# Patient Record
Sex: Male | Born: 1965
Health system: Southern US, Community
[De-identification: ages and names within clinical notes are randomized; demographics above are authoritative.]

## PROBLEM LIST (undated history)

## (undated) DIAGNOSIS — I484 Atypical atrial flutter: Secondary | ICD-10-CM

## (undated) HISTORY — PX: APPENDECTOMY: SHX54

## (undated) HISTORY — DX: Atypical atrial flutter: I48.4

---

## 2006-06-15 ENCOUNTER — Ambulatory Visit: Payer: Self-pay | Admitting: Psychiatry

## 2006-06-15 ENCOUNTER — Inpatient Hospital Stay (HOSPITAL_COMMUNITY): Admission: EM | Admit: 2006-06-15 | Discharge: 2006-06-22 | Payer: Self-pay | Admitting: Psychiatry

## 2013-11-26 DIAGNOSIS — N179 Acute kidney failure, unspecified: Secondary | ICD-10-CM | POA: Diagnosis not present

## 2013-11-26 DIAGNOSIS — A045 Campylobacter enteritis: Secondary | ICD-10-CM | POA: Diagnosis not present

## 2013-11-26 DIAGNOSIS — Z23 Encounter for immunization: Secondary | ICD-10-CM | POA: Diagnosis not present

## 2013-11-26 DIAGNOSIS — D3500 Benign neoplasm of unspecified adrenal gland: Secondary | ICD-10-CM | POA: Diagnosis not present

## 2013-11-26 DIAGNOSIS — Z79899 Other long term (current) drug therapy: Secondary | ICD-10-CM | POA: Diagnosis not present

## 2013-11-26 DIAGNOSIS — F200 Paranoid schizophrenia: Secondary | ICD-10-CM | POA: Diagnosis not present

## 2013-11-26 DIAGNOSIS — E860 Dehydration: Secondary | ICD-10-CM | POA: Diagnosis present

## 2013-11-26 DIAGNOSIS — R112 Nausea with vomiting, unspecified: Secondary | ICD-10-CM | POA: Diagnosis not present

## 2013-12-20 DIAGNOSIS — N179 Acute kidney failure, unspecified: Secondary | ICD-10-CM | POA: Diagnosis not present

## 2013-12-20 DIAGNOSIS — Z23 Encounter for immunization: Secondary | ICD-10-CM | POA: Diagnosis not present

## 2013-12-20 DIAGNOSIS — D509 Iron deficiency anemia, unspecified: Secondary | ICD-10-CM | POA: Diagnosis not present

## 2013-12-20 DIAGNOSIS — E119 Type 2 diabetes mellitus without complications: Secondary | ICD-10-CM | POA: Diagnosis not present

## 2013-12-20 DIAGNOSIS — E039 Hypothyroidism, unspecified: Secondary | ICD-10-CM | POA: Diagnosis not present

## 2013-12-20 DIAGNOSIS — F209 Schizophrenia, unspecified: Secondary | ICD-10-CM | POA: Diagnosis not present

## 2013-12-20 DIAGNOSIS — A045 Campylobacter enteritis: Secondary | ICD-10-CM | POA: Diagnosis not present

## 2014-01-27 DIAGNOSIS — K521 Toxic gastroenteritis and colitis: Secondary | ICD-10-CM | POA: Diagnosis not present

## 2014-01-29 DIAGNOSIS — N183 Chronic kidney disease, stage 3 unspecified: Secondary | ICD-10-CM | POA: Diagnosis not present

## 2014-01-29 DIAGNOSIS — D539 Nutritional anemia, unspecified: Secondary | ICD-10-CM | POA: Diagnosis not present

## 2014-01-29 DIAGNOSIS — K5289 Other specified noninfective gastroenteritis and colitis: Secondary | ICD-10-CM | POA: Diagnosis not present

## 2014-01-29 DIAGNOSIS — D638 Anemia in other chronic diseases classified elsewhere: Secondary | ICD-10-CM | POA: Diagnosis not present

## 2014-03-23 DIAGNOSIS — F209 Schizophrenia, unspecified: Secondary | ICD-10-CM | POA: Diagnosis not present

## 2014-04-02 DIAGNOSIS — F209 Schizophrenia, unspecified: Secondary | ICD-10-CM | POA: Diagnosis not present

## 2014-09-03 DIAGNOSIS — F209 Schizophrenia, unspecified: Secondary | ICD-10-CM | POA: Diagnosis not present

## 2015-02-04 DIAGNOSIS — F209 Schizophrenia, unspecified: Secondary | ICD-10-CM | POA: Diagnosis not present

## 2015-07-08 DIAGNOSIS — F209 Schizophrenia, unspecified: Secondary | ICD-10-CM | POA: Diagnosis not present

## 2015-09-11 DIAGNOSIS — H6123 Impacted cerumen, bilateral: Secondary | ICD-10-CM | POA: Diagnosis not present

## 2015-09-11 DIAGNOSIS — Z79899 Other long term (current) drug therapy: Secondary | ICD-10-CM | POA: Diagnosis not present

## 2015-09-11 DIAGNOSIS — D638 Anemia in other chronic diseases classified elsewhere: Secondary | ICD-10-CM | POA: Diagnosis not present

## 2015-09-11 DIAGNOSIS — N183 Chronic kidney disease, stage 3 (moderate): Secondary | ICD-10-CM | POA: Diagnosis not present

## 2015-09-11 DIAGNOSIS — Z0000 Encounter for general adult medical examination without abnormal findings: Secondary | ICD-10-CM | POA: Diagnosis not present

## 2015-09-11 DIAGNOSIS — Z23 Encounter for immunization: Secondary | ICD-10-CM | POA: Diagnosis not present

## 2015-10-07 DIAGNOSIS — F209 Schizophrenia, unspecified: Secondary | ICD-10-CM | POA: Diagnosis not present

## 2016-07-06 DIAGNOSIS — F209 Schizophrenia, unspecified: Secondary | ICD-10-CM | POA: Diagnosis not present

## 2016-10-19 DIAGNOSIS — F209 Schizophrenia, unspecified: Secondary | ICD-10-CM | POA: Diagnosis not present

## 2016-12-25 DIAGNOSIS — F209 Schizophrenia, unspecified: Secondary | ICD-10-CM | POA: Diagnosis not present

## 2017-02-19 DIAGNOSIS — F209 Schizophrenia, unspecified: Secondary | ICD-10-CM | POA: Diagnosis not present

## 2017-05-18 DIAGNOSIS — F209 Schizophrenia, unspecified: Secondary | ICD-10-CM | POA: Diagnosis not present

## 2019-09-21 DIAGNOSIS — N184 Chronic kidney disease, stage 4 (severe): Secondary | ICD-10-CM | POA: Diagnosis not present

## 2019-09-21 DIAGNOSIS — I5020 Unspecified systolic (congestive) heart failure: Secondary | ICD-10-CM | POA: Diagnosis not present

## 2019-09-21 DIAGNOSIS — I340 Nonrheumatic mitral (valve) insufficiency: Secondary | ICD-10-CM | POA: Diagnosis not present

## 2019-09-21 DIAGNOSIS — I361 Nonrheumatic tricuspid (valve) insufficiency: Secondary | ICD-10-CM

## 2019-09-21 DIAGNOSIS — I4892 Unspecified atrial flutter: Secondary | ICD-10-CM

## 2019-09-22 ENCOUNTER — Encounter (HOSPITAL_COMMUNITY): Payer: Self-pay | Admitting: Internal Medicine

## 2019-09-22 ENCOUNTER — Inpatient Hospital Stay (HOSPITAL_COMMUNITY)
Admission: AD | Admit: 2019-09-22 | Discharge: 2019-09-28 | DRG: 308 | Disposition: A | Discharge status: Home / Self Care | Payer: Medicare Other | Source: Other Acute Inpatient Hospital | Attending: Internal Medicine | Admitting: Internal Medicine

## 2019-09-22 DIAGNOSIS — I420 Dilated cardiomyopathy: Secondary | ICD-10-CM | POA: Diagnosis not present

## 2019-09-22 DIAGNOSIS — I5021 Acute systolic (congestive) heart failure: Secondary | ICD-10-CM | POA: Diagnosis not present

## 2019-09-22 DIAGNOSIS — J90 Pleural effusion, not elsewhere classified: Secondary | ICD-10-CM

## 2019-09-22 DIAGNOSIS — I10 Essential (primary) hypertension: Secondary | ICD-10-CM

## 2019-09-22 DIAGNOSIS — I5043 Acute on chronic combined systolic (congestive) and diastolic (congestive) heart failure: Secondary | ICD-10-CM | POA: Diagnosis present

## 2019-09-22 DIAGNOSIS — I5020 Unspecified systolic (congestive) heart failure: Secondary | ICD-10-CM | POA: Diagnosis not present

## 2019-09-22 DIAGNOSIS — F209 Schizophrenia, unspecified: Secondary | ICD-10-CM

## 2019-09-22 DIAGNOSIS — I484 Atypical atrial flutter: Principal | ICD-10-CM

## 2019-09-22 DIAGNOSIS — N179 Acute kidney failure, unspecified: Secondary | ICD-10-CM | POA: Diagnosis present

## 2019-09-22 DIAGNOSIS — Z79899 Other long term (current) drug therapy: Secondary | ICD-10-CM

## 2019-09-22 DIAGNOSIS — I517 Cardiomegaly: Secondary | ICD-10-CM

## 2019-09-22 DIAGNOSIS — Z20828 Contact with and (suspected) exposure to other viral communicable diseases: Secondary | ICD-10-CM | POA: Diagnosis present

## 2019-09-22 DIAGNOSIS — I4892 Unspecified atrial flutter: Secondary | ICD-10-CM | POA: Diagnosis not present

## 2019-09-22 DIAGNOSIS — I4891 Unspecified atrial fibrillation: Secondary | ICD-10-CM | POA: Diagnosis present

## 2019-09-22 DIAGNOSIS — N390 Urinary tract infection, site not specified: Secondary | ICD-10-CM | POA: Diagnosis present

## 2019-09-22 DIAGNOSIS — N183 Chronic kidney disease, stage 3 unspecified: Secondary | ICD-10-CM | POA: Diagnosis present

## 2019-09-22 DIAGNOSIS — I429 Cardiomyopathy, unspecified: Secondary | ICD-10-CM | POA: Diagnosis not present

## 2019-09-22 DIAGNOSIS — E876 Hypokalemia: Secondary | ICD-10-CM | POA: Diagnosis present

## 2019-09-22 DIAGNOSIS — I5041 Acute combined systolic (congestive) and diastolic (congestive) heart failure: Secondary | ICD-10-CM | POA: Diagnosis not present

## 2019-09-22 DIAGNOSIS — I130 Hypertensive heart and chronic kidney disease with heart failure and stage 1 through stage 4 chronic kidney disease, or unspecified chronic kidney disease: Secondary | ICD-10-CM | POA: Diagnosis present

## 2019-09-22 DIAGNOSIS — N184 Chronic kidney disease, stage 4 (severe): Secondary | ICD-10-CM | POA: Diagnosis present

## 2019-09-22 DIAGNOSIS — N189 Chronic kidney disease, unspecified: Secondary | ICD-10-CM | POA: Diagnosis not present

## 2019-09-22 DIAGNOSIS — I428 Other cardiomyopathies: Secondary | ICD-10-CM | POA: Diagnosis not present

## 2019-09-22 DIAGNOSIS — J181 Lobar pneumonia, unspecified organism: Secondary | ICD-10-CM

## 2019-09-22 DIAGNOSIS — IMO0002 Reserved for concepts with insufficient information to code with codable children: Secondary | ICD-10-CM

## 2019-09-22 DIAGNOSIS — I340 Nonrheumatic mitral (valve) insufficiency: Secondary | ICD-10-CM | POA: Diagnosis not present

## 2019-09-22 HISTORY — DX: Essential (primary) hypertension: I10

## 2019-09-22 HISTORY — DX: Unspecified atrial fibrillation: I48.91

## 2019-09-22 HISTORY — DX: Cardiomegaly: I51.7

## 2019-09-22 HISTORY — DX: Schizophrenia, unspecified: F20.9

## 2019-09-22 HISTORY — DX: Pleural effusion, not elsewhere classified: J90

## 2019-09-22 HISTORY — DX: Lobar pneumonia, unspecified organism: J18.1

## 2019-09-22 HISTORY — DX: Chronic kidney disease, stage 3 unspecified: N18.30

## 2019-09-22 HISTORY — DX: Reserved for concepts with insufficient information to code with codable children: IMO0002

## 2019-09-22 MED ORDER — ACETAMINOPHEN 325 MG PO TABS: 650.0000 mg | ORAL_TABLET | ORAL | Status: DC | PRN

## 2019-09-22 MED ORDER — APIXABAN 5 MG PO TABS
5.0000 mg | ORAL_TABLET | Freq: Two times a day (BID) | ORAL | Status: DC
  Administered 2019-09-22 – 2019-09-28 (×12): 5 mg via ORAL
  Filled 2019-09-22 (×12): qty 1

## 2019-09-22 MED ORDER — DILTIAZEM HCL-DEXTROSE 125-5 MG/125ML-% IV SOLN (PREMIX)
5.0000 mg/h | INTRAVENOUS | Status: DC
  Filled 2019-09-22: qty 125

## 2019-09-22 MED ORDER — AMIODARONE HCL IN DEXTROSE 360-4.14 MG/200ML-% IV SOLN
60.0000 mg/h | INTRAVENOUS | Status: AC
  Administered 2019-09-22: 60 mg/h via INTRAVENOUS
  Filled 2019-09-22: qty 200

## 2019-09-22 MED ORDER — ONDANSETRON HCL 4 MG/2ML IJ SOLN: 4.0000 mg | Freq: Four times a day (QID) | INTRAMUSCULAR | Status: DC | PRN

## 2019-09-22 MED ORDER — DILTIAZEM HCL-DEXTROSE 125-5 MG/125ML-% IV SOLN (PREMIX): 5.0000 mg/h | INTRAVENOUS | Status: DC

## 2019-09-22 MED ORDER — AMIODARONE HCL IN DEXTROSE 360-4.14 MG/200ML-% IV SOLN
30.0000 mg/h | INTRAVENOUS | Status: AC
  Administered 2019-09-23 – 2019-09-27 (×9): 30 mg/h via INTRAVENOUS
  Filled 2019-09-22 (×9): qty 200

## 2019-09-22 NOTE — Consult Note (Signed)
Cardiology Consultation:   Patient ID: VEDANT SHEHADEH MRN: [id number]; DOB: 05/01/1966  Admit date: 09/22/2019 Date of Consult: 09/22/2019  Primary Care Provider: No primary care provider on file. Primary Cardiologist: No primary care provider on file.  Primary Electrophysiologist:  None    Patient Profile:   Michael Mcknight is a 53 y.o. male with a history of well controlled schizophrenia and known CKD who presents with SOB and LE edema, found to have newly diagnosed systolic heart failure with rapid aflutter.   History of Present Illness:   Michael Mcknight was in his usual state of health until two weeks prior to presentation when he noted increasing dyspnea on exertion and LE swelling. He also describes increased orthopnea and PND. His HR was checked in the outpatient setting and was found to be 130, for which he presented to the [facility] ED. Initial EKG there notable for 2:1 atrial flutter ~ 140. Labs were notable for a Cr of 2.8 (unclear baseline) and an elevated BNP to 8200. VQ scan low probability for PE. CT chest notable for moderate cardiomegaly with mild interstitial edema and R > L pleuarl effusions. Cardiology was consulted for rate control recommendations. TTE performed and notable for LVEF 25% with moderate RV dysfunction and RVSP 70mHg.   He was trialed on IV cardizem without improvement in rate control and subsequently transitioned to amiodarone gtt. He also required treatment for flash pulmonary edema with nitro paste and IV lasix for respiratory distress.   At the time of transfer, patient in 2:1 AF at 130. BP 114/96 with SpO2 98% on 2L [place]. He continued to complain of only mild SOB.    Home Medications:  Prior to Admission medications   Not on File    Inpatient Medications: Scheduled Meds: . apixaban  5 mg Oral BID   Continuous Infusions: . amiodarone    . [START ON 09/23/2019] amiodarone     PRN Meds: acetaminophen, ondansetron (ZOFRAN) IV  Allergies:   Not on  File  Social History:   Social History   Socioeconomic History  . Marital status: Married    Spouse name: Not on file  . Number of children: Not on file  . Years of education: Not on file  . Highest education level: Not on file  Occupational History  . Not on file  Social Needs  . Financial resource strain: Not on file  . Food insecurity    Worry: Not on file    Inability: Not on file  . Transportation needs    Medical: Not on file    Non-medical: Not on file  Tobacco Use  . Smoking status: Never Smoker  . Smokeless tobacco: Never Used  Substance and Sexual Activity  . Alcohol use: Not on file  . Drug use: Not on file  . Sexual activity: Not on file  Lifestyle  . Physical activity    Days per week: Not on file    Minutes per session: Not on file  . Stress: Not on file  Relationships  . Social [place] phone: Not on file    Gets together: Not on file    Attends religious service: Not on file    Active member of club or organization: Not on file    Attends meetings of clubs or organizations: Not on file    Relationship status: Not on file  . Intimate partner violence    Fear of current or ex partner: Not on file  Emotionally abused: Not on file    Physically abused: Not on file    Forced sexual activity: Not on file  Other Topics Concern  . Not on file  Social History Narrative  . Not on file    Family History:   Sister with diabetes.   Review of Systems: [y] = yes, [ ]  = no     General: Weight gain [ ] ; Weight loss [ ] ; Anorexia [ ] ; Fatigue [ ] ; Fever [ ] ; Chills [ ] ; Weakness [ ]    Cardiac: Chest pain/pressure [ ] ; Resting SOB [ y]; Exertional SOB Blue.Reese ]; Orthopnea [ ] ; Pedal Edema Blue.Reese ]; Palpitations [ ] ; Syncope [ ] ; Presyncope [ ] ; Paroxysmal nocturnal dyspnea[ ]    Pulmonary: Cough [ ] ; Wheezing[ ] ; Hemoptysis[ ] ; Sputum [ ] ; Snoring [ ]    GI: Vomiting[ ] ; Dysphagia[ ] ; Melena[ ] ; Hematochezia [ ] ; Heartburn[ ] ; Abdominal pain [ ] ;  Constipation [ ] ; Diarrhea [ ] ; BRBPR [ ]    GU: Hematuria[ ] ; Dysuria [ ] ; Nocturia[ ]    Vascular: Pain in legs with walking [ ] ; Pain in feet with lying flat [ ] ; Non-healing sores [ ] ; Stroke [ ] ; TIA [ ] ; Slurred speech [ ] ;   Neuro: Headaches[ ] ; Vertigo[ ] ; Seizures[ ] ; Paresthesias[ ] ;Blurred vision [ ] ; Diplopia [ ] ; Vision changes [ ]    Ortho/Skin: Arthritis [ ] ; Joint pain [ ] ; Muscle pain [ ] ; Joint swelling [ ] ; Back Pain [ ] ; Rash [ ]    Psych: Depression[ ] ; Anxiety[ ]    Heme: Bleeding problems [ ] ; Clotting disorders [ ] ; Anemia [ ]    Endocrine: Diabetes [ ] ; Thyroid dysfunction[ ]   Physical Exam/Data:   Vitals:   09/22/19 2200 09/22/19 2227  BP: (!) 118/98 (!) 118/98  Pulse: (!) 133 (!) 133  Resp: 18 18  Temp: 99.1 F (37.3 C) 99.1 F (37.3 C)  TempSrc: Oral Oral  SpO2: 100%   Height:  5' 7"  (1.702 m)   No intake or output data in the 24 hours ending 09/22/19 2341 There were no vitals filed for this visit. There is no height or weight on file to calculate BMI.  General:  Obese caucasian male, lying in bed with HOB at 30 degrees.  HEENT: normal Lymph: no adenopathy Neck: JVD to the earlobe.  Endocrine:  No thryomegaly Vascular: No carotid bruits; FA pulses 2+ bilaterally without bruits  Cardiac:  Regular. Tachycardic.  Lungs:  Bibasilar rales with end-expiraotry wheezes.  Abd: soft, nontender, no hepatomegaly  Ext: no edema. Warm and well perfused.  Musculoskeletal:  No deformities, BUE and BLE strength normal and equal Skin: warm and dry  Neuro:  CNs 2-12 intact, no focal abnormalities noted Psych:  Normal affect   EKG:  The EKG was personally reviewed and demonstrates: 2:1 atypical aflutter.   Relevant CV Studies Oval Linsey): TTE 11/20/18: LVEF 25% - Global hypokinesis RV moderately impaired systolic function LVEDD 6.4 cm eRVSP 40 mmHg Elevated LA pressure.  Moderate MR Mild to Moderate TR  Laboratory Data:  Na 137 K 4.5 Cl 101 CO2 31 BUN 37  Cr 2.8 eGFR 24 BNP 8200  Radiology/Studies:  No results found.  Assessment and Plan:   Michael Mcknight is a 53 year old gentleman with advanced CKD who presents with two weeks of SOB and palpitations, found to have acute decompensated systolic heart and atypical atrial flutter. Certainly plausible that dilated cardiomyopathy tachycardia mediated, but will require further investigation. He, not unsurprisingly, has been very  difficult to rate control and I do not suspect we will make much progress in this regard. Agree that he will need TEE and DCCV - will plan for Monday. Needs optimization of volume status prior to this.   #Acute, Decompensated systolic HF -- Would continue lasix 80 mg IV BID for goal net negative 1-2L daily.  -- Would benefit from afterload reduction, though limited in choice of agent by CKD and fluctuating creatinine. Will add low dose short acting nitrate with plan to initiate ACEi v. ARB once creatinine has achieved homeostasis.  -- Please check HbA1c, lipid panel to risk stratify.  -- Need to assess insurance situation for consideration of Entresto / SGLT2i.  -- Consider repeat echo after DCCV.   #2:1 Atypical Aflutter -- Ok to continue amiodarone gtt; do not expect much progress in rate control.  -- check TSH as you are.  -- Needs outpatient sleep study and consider CPAP while inpatient (I witness period of apnea/hypopnea with associated desaturation when I went to examine the patient.  -- Anticoagulation with apixiban 83m daily; consider dose reduction to 2.559mdaily if CrCl falls further (currently 27). CHADS-2-Vasc = 1 but needs AC in preparation for DCCV.   #CKD -- Please send UA to quantify proteinuria -- Consider renal ultrasound to better elucidate etiology of CKD.   Would plan to make NPO after MN Sunday night for planned DCCV Monday AM.   Cardiology will continue to follow.   For questions or updates, please contact [facility] consult  [email or web address] for contact info under   Signed, LaMilus BanisterMD  09/22/2019 11:41 PM

## 2019-09-22 NOTE — H&P (Signed)
History and Physical   Michael Mcknight SFK:[id number] DOB: 03/02/1966 DOA: 09/22/2019  Referring MD/NP/PA: [facility]  PCP: No primary care provider on file.   Outpatient Specialists: None  Patient coming from: [facility]  Chief Complaint: Palpitations  HPI: Michael Mcknight is a 53 y.o. male with medical history significant of schizophrenia, hypertension, chronic kidney disease stage III, who was seen at [facility] emergency room initially with shortness of breath and palpitation.  Symptoms started about 2 weeks ago when he started noticing increasing exertional dyspnea that has progressed.  He was seen yesterday and found to have heart rate of 130s.  He was sent a flutter with 221.  Admitted to monitored hospital and seen by cardiology.  Bedside echocardiogram was done showing dilated cardiomyopathy.  Patient was initiated on IV Cardizem and some beta-blockers but rate was not controlled.  He was also initiated on amiodarone with minimal response.  Plan was to do a TEE cardioversion as patient has decompensated heart failure from the A. fib.  TEE probe not available at [facility] so patient transferred here for the same purpose.  No chest pain, no significant cough or shortness of breath at the moment.  Patient has no other significant complaint at this point..   Review of Systems: As per HPI otherwise 10 point review of systems negative.    History reviewed. No pertinent past medical history.  History reviewed. No pertinent surgical history.   has no history on file for tobacco, alcohol, and drug.  Not on File  History reviewed. No pertinent family history.   Prior to Admission medications   Not on File    Physical Exam: Vitals:   09/22/19 2200 09/22/19 2227  BP: (!) 118/98 (!) 118/98  Pulse: (!) 133 (!) 133  Resp: 18 18  Temp: 99.1 F (37.3 C) 99.1 F (37.3 C)  TempSrc: Oral Oral  SpO2: 100%   Height:  5\' 7"  (1.702 m)      Constitutional: NAD,  anxious, worried Vitals:   09/22/19 2200 09/22/19 2227  BP: (!) 118/98 (!) 118/98  Pulse: (!) 133 (!) 133  Resp: 18 18  Temp: 99.1 F (37.3 C) 99.1 F (37.3 C)  TempSrc: Oral Oral  SpO2: 100%   Height:  5\' 7"  (1.702 m)   Eyes: PERRL, lids and conjunctivae normal ENMT: Mucous membranes are moist. Posterior pharynx clear of any exudate or lesions.Normal dentition.  Neck: normal, supple, no masses, no thyromegaly Respiratory: clear to auscultation bilaterally, no wheezing, basal crackles. Normal respiratory effort. No accessory muscle use.  Cardiovascular: Irregularly irregular and tachycardic, no murmurs / rubs / gallops. No extremity edema. 2+ pedal pulses. No carotid bruits.  Abdomen: no tenderness, no masses palpated. No hepatosplenomegaly. Bowel sounds positive.  Musculoskeletal: no clubbing / cyanosis. No joint deformity upper and lower extremities. Good ROM, no contractures. Normal muscle tone.  Skin: no rashes, lesions, ulcers. No induration Neurologic: CN 2-12 grossly intact. Sensation intact, DTR normal. Strength 5/5 in all 4.  Psychiatric: Normal judgment and insight. Alert and oriented x 3. Normal mood.     Labs on Admission: I have personally reviewed following labs and imaging studies  CBC: No results for input(s): WBC, NEUTROABS, HGB, HCT, MCV, PLT in the last 168 hours. Basic Metabolic Panel: No results for input(s): NA, K, CL, CO2, GLUCOSE, BUN, CREATININE, CALCIUM, MG, PHOS in the last 168 hours. GFR: CrCl cannot be calculated (No successful lab value found.). Liver Function Tests: No results for input(s): AST, ALT,  ALKPHOS, BILITOT, PROT, ALBUMIN in the last 168 hours. No results for input(s): LIPASE, AMYLASE in the last 168 hours. No results for input(s): AMMONIA in the last 168 hours. Coagulation Profile: No results for input(s): INR, PROTIME in the last 168 hours. Cardiac Enzymes: No results for input(s): CKTOTAL, CKMB, CKMBINDEX, TROPONINI in the last 168  hours. BNP (last 3 results) No results for input(s): PROBNP in the last 8760 hours. HbA1C: No results for input(s): HGBA1C in the last 72 hours. CBG: No results for input(s): GLUCAP in the last 168 hours. Lipid Profile: No results for input(s): CHOL, HDL, LDLCALC, TRIG, CHOLHDL, LDLDIRECT in the last 72 hours. Thyroid Function Tests: No results for input(s): TSH, T4TOTAL, FREET4, T3FREE, THYROIDAB in the last 72 hours. Anemia Panel: No results for input(s): VITAMINB12, FOLATE, FERRITIN, TIBC, IRON, RETICCTPCT in the last 72 hours. Urine analysis: No results found for: COLORURINE, APPEARANCEUR, LABSPEC, PHURINE, GLUCOSEU, HGBUR, BILIRUBINUR, KETONESUR, PROTEINUR, UROBILINOGEN, NITRITE, LEUKOCYTESUR Sepsis Labs: @LABRCNTIP (procalcitonin:4,lacticidven:4) )No results found for this or any previous visit (from the past 240 hour(s)).   Radiological Exams on Admission: No results found.    Assessment/Plan Principal Problem:   Atrial fibrillation/flutter (HCC) Active Problems:   CKD (chronic kidney disease), stage III   Schizophrenia (HCC)   Cardiomegaly   Pleural effusion   Lobar pneumonia (HCC)   Benign essential HTN     #1 persistent atrial flutter with decompensated heart failure: Cardiology consulted.  Patient currently on amiodarone.  EKG to be repeated.  Patient is transfer for TEE cardioversion.  Will defer to cardiology  #2 lobar pneumonia: Diagnosed at [facility].  Initiate and continue with antibiotics.  #3 chronic kidney disease stage III: Continue to monitor renal function  #4 mild pleural effusion: Patient may require diuresis.  I will defer to cardiology  #5 benign essential hypertension: Continue blood pressure control  #6 history of schizophrenia: Obtain home med list and restart   DVT prophylaxis: Eliquis Code Status: Full code Family Communication: No family at bedside Disposition Plan: To be determined Consults called: Cardiology, Dr. Marletta Lor  Admission status: Inpatient  Severity of Illness: The appropriate patient status for this patient is INPATIENT. Inpatient status is judged to be reasonable and necessary in order to provide the required intensity of service to ensure the patient's safety. The patient's presenting symptoms, physical exam findings, and initial radiographic and laboratory data in the context of their chronic comorbidities is felt to place them at high risk for further clinical deterioration. Furthermore, it is not anticipated that the patient will be medically stable for discharge from the hospital within 2 midnights of admission. The following factors support the patient status of inpatient.   " The patient's presenting symptoms include a flutter. " The worrisome physical exam findings include irregularly irregular rate with tachycardia. " The initial radiographic and laboratory data are worrisome because of currently pain. " The chronic co-morbidities include schizophrenia.   * I certify that at the point of admission it is my clinical judgment that the patient will require inpatient hospital care spanning beyond 2 midnights from the point of admission due to high intensity of service, high risk for further deterioration and high frequency of surveillance required.Barbette Merino MD [facility] Pager [phone]  If 7PM-7AM, please contact night-coverage [email or web address] Password [email or web address]  09/22/2019, 11:17 PM

## 2019-09-23 ENCOUNTER — Other Ambulatory Visit: Payer: Self-pay

## 2019-09-23 DIAGNOSIS — I484 Atypical atrial flutter: Principal | ICD-10-CM

## 2019-09-23 DIAGNOSIS — I428 Other cardiomyopathies: Secondary | ICD-10-CM

## 2019-09-23 LAB — LIPID PANEL
Cholesterol: 138 mg/dL (ref 0–200)
HDL: 35 mg/dL — ABNORMAL LOW (ref >40)
LDL Cholesterol: 86 mg/dL (ref 0–99)
Total CHOL/HDL Ratio: 3.9 RATIO
Triglycerides: 84 mg/dL (ref <150)
VLDL: 17 mg/dL (ref 0–40)

## 2019-09-23 LAB — COMPREHENSIVE METABOLIC PANEL
ALT: 46 U/L — ABNORMAL HIGH (ref 0–44)
AST: 46 U/L — ABNORMAL HIGH (ref 15–41)
Albumin: 3.3 g/dL — ABNORMAL LOW (ref 3.5–5.0)
Alkaline Phosphatase: 67 U/L (ref 38–126)
Anion gap: 10 (ref 5–15)
BUN: 35 mg/dL — ABNORMAL HIGH (ref 6–20)
CO2: 28 mmol/L (ref 22–32)
Calcium: 8.6 mg/dL — ABNORMAL LOW (ref 8.9–10.3)
Chloride: 102 mmol/L (ref 98–111)
Creatinine, Ser: 3.09 mg/dL — ABNORMAL HIGH (ref 0.61–1.24)
GFR calc Af Amer: 26 mL/min — ABNORMAL LOW (ref >60)
GFR calc non Af Amer: 22 mL/min — ABNORMAL LOW (ref >60)
Glucose, Bld: 92 mg/dL (ref 70–99)
Potassium: 4.1 mmol/L (ref 3.5–5.1)
Sodium: 140 mmol/L (ref 135–145)
Total Bilirubin: 0.7 mg/dL (ref 0.3–1.2)
Total Protein: 7.4 g/dL (ref 6.5–8.1)

## 2019-09-23 LAB — URINALYSIS, MICROSCOPIC (REFLEX)
RBC / HPF: >50 RBC/hpf (ref 0–5)
Squamous Epithelial / HPF: NONE SEEN (ref 0–5)
WBC, UA: >50 WBC/hpf (ref 0–5)

## 2019-09-23 LAB — URINALYSIS, ROUTINE W REFLEX MICROSCOPIC
Glucose, UA: NEGATIVE mg/dL
Ketones, ur: NEGATIVE mg/dL
Nitrite: POSITIVE — AB
Protein, ur: 30 mg/dL — AB
Specific Gravity, Urine: 1.025 (ref 1.005–1.030)
pH: 5.5 (ref 5.0–8.0)

## 2019-09-23 LAB — CBC
HCT: 37.5 % — ABNORMAL LOW (ref 39.0–52.0)
Hemoglobin: 11.9 g/dL — ABNORMAL LOW (ref 13.0–17.0)
MCH: 28.4 pg (ref 26.0–34.0)
MCHC: 31.7 g/dL (ref 30.0–36.0)
MCV: 89.5 fL (ref 80.0–100.0)
Platelets: 452 10*3/uL — ABNORMAL HIGH (ref 150–400)
RBC: 4.19 MIL/uL — ABNORMAL LOW (ref 4.22–5.81)
RDW: 14.7 % (ref 11.5–15.5)
WBC: 8.6 10*3/uL (ref 4.0–10.5)
nRBC: 0 % (ref 0.0–0.2)

## 2019-09-23 LAB — HEMOGLOBIN A1C
Hgb A1c MFr Bld: 6.1 % — ABNORMAL HIGH (ref 4.8–5.6)
Mean Plasma Glucose: 128.37 mg/dL

## 2019-09-23 LAB — BASIC METABOLIC PANEL
Anion gap: 12 (ref 5–15)
BUN: 35 mg/dL — ABNORMAL HIGH (ref 6–20)
CO2: 27 mmol/L (ref 22–32)
Calcium: 8.4 mg/dL — ABNORMAL LOW (ref 8.9–10.3)
Chloride: 101 mmol/L (ref 98–111)
Creatinine, Ser: 3.11 mg/dL — ABNORMAL HIGH (ref 0.61–1.24)
GFR calc Af Amer: 25 mL/min — ABNORMAL LOW (ref >60)
GFR calc non Af Amer: 22 mL/min — ABNORMAL LOW (ref >60)
Glucose, Bld: 100 mg/dL — ABNORMAL HIGH (ref 70–99)
Potassium: 4.4 mmol/L (ref 3.5–5.1)
Sodium: 140 mmol/L (ref 135–145)

## 2019-09-23 LAB — BRAIN NATRIURETIC PEPTIDE: B Natriuretic Peptide: 414.1 pg/mL — ABNORMAL HIGH (ref 0.0–100.0)

## 2019-09-23 LAB — PROCALCITONIN: Procalcitonin: 0.25 ng/mL

## 2019-09-23 LAB — HIV ANTIBODY (ROUTINE TESTING W REFLEX): HIV Screen 4th Generation wRfx: NONREACTIVE

## 2019-09-23 LAB — TSH: TSH: 1.463 u[IU]/mL (ref 0.350–4.500)

## 2019-09-23 MED ORDER — FUROSEMIDE 10 MG/ML IJ SOLN
80.0000 mg | Freq: Two times a day (BID) | INTRAMUSCULAR | Status: DC
  Administered 2019-09-23 – 2019-09-25 (×4): 80 mg via INTRAVENOUS
  Filled 2019-09-23 (×5): qty 8

## 2019-09-23 MED ORDER — CHLORHEXIDINE GLUCONATE CLOTH 2 % EX PADS
6.0000 | MEDICATED_PAD | Freq: Every day | CUTANEOUS | Status: DC
  Administered 2019-09-23 – 2019-09-27 (×5): 6 via TOPICAL

## 2019-09-23 MED ORDER — FUROSEMIDE 10 MG/ML IJ SOLN
80.0000 mg | Freq: Once | INTRAMUSCULAR | Status: AC
  Administered 2019-09-23: 80 mg via INTRAVENOUS
  Filled 2019-09-23: qty 8

## 2019-09-23 MED ORDER — ISOSORBIDE DINITRATE 10 MG PO TABS
10.0000 mg | ORAL_TABLET | Freq: Three times a day (TID) | ORAL | Status: DC
  Administered 2019-09-23 – 2019-09-28 (×18): 10 mg via ORAL
  Filled 2019-09-23 (×19): qty 1

## 2019-09-23 MED ORDER — HYDRALAZINE HCL 20 MG/ML IJ SOLN
5.0000 mg | Freq: Once | INTRAMUSCULAR | Status: AC
  Administered 2019-09-23: 06:00:00 5 mg via INTRAVENOUS
  Filled 2019-09-23: qty 1

## 2019-09-23 MED ORDER — ESCITALOPRAM OXALATE 10 MG PO TABS
10.0000 mg | ORAL_TABLET | Freq: Every day | ORAL | Status: DC
  Administered 2019-09-23 – 2019-09-28 (×6): 10 mg via ORAL
  Filled 2019-09-23 (×6): qty 1

## 2019-09-23 MED ORDER — TRIHEXYPHENIDYL HCL 2 MG PO TABS
2.0000 mg | ORAL_TABLET | Freq: Two times a day (BID) | ORAL | Status: DC
  Administered 2019-09-23 – 2019-09-28 (×9): 2 mg via ORAL
  Filled 2019-09-23 (×12): qty 1

## 2019-09-23 MED ORDER — RISPERIDONE 2 MG PO TABS
4.0000 mg | ORAL_TABLET | Freq: Every day | ORAL | Status: DC
  Administered 2019-09-23 – 2019-09-27 (×5): 4 mg via ORAL
  Filled 2019-09-23 (×6): qty 2

## 2019-09-23 MED ORDER — FUROSEMIDE 10 MG/ML IJ SOLN
INTRAMUSCULAR | Status: AC
  Filled 2019-09-23: qty 4

## 2019-09-23 NOTE — Progress Notes (Signed)
Received patient to room 4e07 from [place] around 2130, patient altert and oriented without complaints. Tele monitor applied and CCMD notified. CHG bath given. Oriented to room and call bell within reach.

## 2019-09-23 NOTE — Progress Notes (Signed)
PROGRESS NOTE    Michael Mcknight  PYK:[id number] DOB: 08/13/1966 DOA: 09/22/2019 PCP: No primary care provider on file.    Brief Narrative:  53 year old gentleman with history of a schizophrenia, no known previous problems, poor historian presented to [facility] emergency room 2 days ago with about 2 weeks history of not feeling well and shortness of breath.  No fever or chills.  No airway symptoms. He was admitted to [facility] with rapid a flutter, treated with amiodarone, EF was 25%, unable to achieve rate control and transferred to [facility]. Patient does not know his baseline renal functions.  He thinks he might have some kidney issues in the past.  He says his last checkup with his doctor was 3 years ago.  Patient not known to have congestive heart failure.   Assessment & Plan:   Principal Problem:   Atrial fibrillation/flutter ([place]) Active Problems:   CKD (chronic kidney disease), stage III   Schizophrenia (HCC)   Cardiomegaly   Pleural effusion   Lobar pneumonia (HCC)   Benign essential HTN  Persistent rapid Aflutter : Currently remains on amiodarone infusion.  Started on Eliquis.  Blood pressures are stable.  Seen by cardiology.  Plan for TEE cardioversion on 09/25/2019.  Continue close monitoring.  Cardiomyopathy, EF 25% at [place] TTE: With mild fluid overload.  He is mostly on room air.  Will start patient on IV Lasix with close monitoring of renal functions and blood pressures.  Recheck electrolytes including magnesium and phosphorus in the morning.  Probably tachycardia mediated congestive cardiomyopathy.  Followed by cardiology.  AKI on CKD , unknown baseline: Presentation creatinine was 2.4 at [facility].  Baseline unknown.  Creatinine 3.11.  Adequate urine output.  Total output more than 3 L last 24 hours. Starting on high-dose Lasix.  We will continue to monitor renal functions.  If this is from acute congestive heart failure this will gradually improve.  Checking  urinalysis.  Schizophrenia: Well controlled as per patient.  Lives at home with his sister.  Patient is on risperidone, Lexapro and trihexyphenidyl that I will resume.  RUL lung abnormalities on CT chest: Covid 19 negative: His CT scan without contrast done on admission showed some right middle lobe abnormalities.  No leukocytosis.  He has no cough or sputum production. Was treated with antibiotics in the hospital at [facility]. We will keep him off antibiotics and monitor.  will check his procalcitonin level.  DVT prophylaxis: Eliquis Code Status: Full code Family Communication: None Disposition Plan: Remains in the hospital   Consultants:   Cardiology  Procedures:   None  Antimicrobials:   None   Subjective: Patient seen and examined.  No overnight events.  His urine is slightly pink.  He denies any shortness of breath.  Remains with rapid heart rate.  Objective: Vitals:   09/23/19 0617 09/23/19 0652 09/23/19 0700 09/23/19 0800  BP:  (!) 129/98 122/89 (!) 138/91  Pulse: (!) 131 (!) 115 (!) 134 (!) 133  Resp: 17 18 18 19   Temp:    98 F (36.7 C)  TempSrc:    Axillary  SpO2: 100% 93% 93% 92%  Weight:      Height:        Intake/Output Summary (Last 24 hours) at 09/23/2019 0950 Last data filed at 09/23/2019 0900 Gross per 24 hour  Intake 108.05 ml  Output 4775 ml  Net -4666.95 ml   Filed Weights   09/23/19 0500  Weight: 111.5 kg    Examination:  General  exam: Appears calm and comfortable, slightly anxious. Respiratory system: Clear to auscultation. Respiratory effort normal.  No added sounds. Cardiovascular system: S1 & S2 heard, irregularly irregular. No JVD, murmurs, rubs, gallops or clicks. No pedal edema. Gastrointestinal system: Abdomen is nondistended, soft and nontender. No organomegaly or masses felt. Normal bowel sounds heard. Central nervous system: Alert and oriented. No focal neurological deficits. Extremities: Symmetric 5 x 5 power. Skin: No  rashes, lesions or ulcers Psychiatry: Judgement and insight appear normal. Mood & affect flat and anxious.    Data Reviewed: I have personally reviewed following labs and imaging studies  CBC: Recent Labs  Lab 09/23/19 0247  WBC 8.6  HGB 11.9*  HCT 37.5*  MCV 89.5  PLT 671*   Basic Metabolic Panel: Recent Labs  Lab 09/22/19 2313 09/23/19 0247  NA 140 140  K 4.1 4.4  CL 102 101  CO2 28 27  GLUCOSE 92 100*  BUN 35* 35*  CREATININE 3.09* 3.11*  CALCIUM 8.6* 8.4*   GFR: Estimated Creatinine Clearance: 33.1 mL/min (A) (by C-G formula based on SCr of 3.11 mg/dL (H)). Liver Function Tests: Recent Labs  Lab 09/22/19 2313  AST 46*  ALT 46*  ALKPHOS 67  BILITOT 0.7  PROT 7.4  ALBUMIN 3.3*   No results for input(s): LIPASE, AMYLASE in the last 168 hours. No results for input(s): AMMONIA in the last 168 hours. Coagulation Profile: No results for input(s): INR, PROTIME in the last 168 hours. Cardiac Enzymes: No results for input(s): CKTOTAL, CKMB, CKMBINDEX, TROPONINI in the last 168 hours. BNP (last 3 results) No results for input(s): PROBNP in the last 8760 hours. HbA1C: Recent Labs    09/23/19 0247  HGBA1C 6.1*   CBG: No results for input(s): GLUCAP in the last 168 hours. Lipid Profile: Recent Labs    09/23/19 0247  CHOL 138  HDL 35*  LDLCALC 86  TRIG 84  CHOLHDL 3.9   Thyroid Function Tests: Recent Labs    09/22/19 2313  TSH 1.463   Anemia Panel: No results for input(s): VITAMINB12, FOLATE, FERRITIN, TIBC, IRON, RETICCTPCT in the last 72 hours. Sepsis Labs: No results for input(s): PROCALCITON, LATICACIDVEN in the last 168 hours.  No results found for this or any previous visit (from the past 240 hour(s)).       Radiology Studies: No results found.      Scheduled Meds: . apixaban  5 mg Oral BID  . escitalopram  10 mg Oral Daily  . isosorbide dinitrate  10 mg Oral Q8H  . risperiDONE  4 mg Oral QHS  . trihexyphenidyl  2 mg Oral  BID WC   Continuous Infusions: . amiodarone 30 mg/hr (09/23/19 0543)     LOS: 1 day    Time spent: 30 minutes    Barb Merino, MD [facility] Pager [phone]

## 2019-09-23 NOTE — Progress Notes (Addendum)
Patient BP 150/122 manual. Paged MD, awaiting orders.   MD stated to check BP again in 2 hours 4:43 AM

## 2019-09-23 NOTE — Progress Notes (Signed)
Patient continously desat due to mouth breathing while sleeping. Venturi Mask applied, 14L at 50%. Sats are being sustained in the 90's. Will continue to monitor oxygen satuaration.

## 2019-09-23 NOTE — Progress Notes (Signed)
Pt continues with heart rate up to the 130's while on amio drip.  Pt is asymptomatic and frequent vitals being done.  Cardiology is aware and planning DCCV on Monday 09/25/19.  Will continue to monitor.

## 2019-09-23 NOTE — Progress Notes (Signed)
Progress Note  Patient Name: Michael Mcknight Date of Encounter: 09/23/2019  Primary Cardiologist: Shirlee More, MD  Subjective   No shortness of breath at rest.  He does not feel any specific sense of palpitations at this time.  No chest pain.  Lying flat in bed.  Inpatient Medications    Scheduled Meds: . apixaban  5 mg Oral BID  . escitalopram  10 mg Oral Daily  . isosorbide dinitrate  10 mg Oral Q8H  . risperiDONE  4 mg Oral QHS  . trihexyphenidyl  2 mg Oral BID WC   Continuous Infusions: . amiodarone 30 mg/hr (09/23/19 0543)   PRN Meds: acetaminophen, ondansetron (ZOFRAN) IV   Vital Signs    Vitals:   09/23/19 0617 09/23/19 0652 09/23/19 0700 09/23/19 0800  BP:  (!) 129/98 122/89 (!) 138/91  Pulse: (!) 131 (!) 115 (!) 134 (!) 133  Resp: 17 18 18 19   Temp:    98 F (36.7 C)  TempSrc:    Axillary  SpO2: 100% 93% 93% 92%  Weight:      Height:        Intake/Output Summary (Last 24 hours) at 09/23/2019 1002 Last data filed at 09/23/2019 0900 Gross per 24 hour  Intake 108.05 ml  Output 4775 ml  Net -4666.95 ml   Filed Weights   09/23/19 0500  Weight: 111.5 kg    Telemetry    Atrial flutter with intermittently variable but largely 2:1 block.  Personally reviewed.  ECG    An ECG dated 09/23/2019 was personally reviewed today and demonstrated:  Atrial flutter with 2:1 block and incomplete right bundle branch block.  Physical Exam   GEN:  Obese male.  No acute distress.   Neck:  Increased girth. Cardiac:  Rapid and largely regular rhythm,, no obvious gallop.  Respiratory:  Diminished breath sounds without wheezing. GI: Soft, nontender, bowel sounds present. MS: No edema; No deformity. Neuro:  Nonfocal. Psych: Alert and oriented x 3.  Somewhat flat affect.  Labs    Chemistry Recent Labs  Lab 09/22/19 2313 09/23/19 0247  NA 140 140  K 4.1 4.4  CL 102 101  CO2 28 27  GLUCOSE 92 100*  BUN 35* 35*  CREATININE 3.09* 3.11*  CALCIUM 8.6* 8.4*   PROT 7.4  --   ALBUMIN 3.3*  --   AST 46*  --   ALT 46*  --   ALKPHOS 67  --   BILITOT 0.7  --   GFRNONAA 22* 22*  GFRAA 26* 25*  ANIONGAP 10 12     Hematology Recent Labs  Lab 09/23/19 0247  WBC 8.6  RBC 4.19*  HGB 11.9*  HCT 37.5*  MCV 89.5  MCH 28.4  MCHC 31.7  RDW 14.7  PLT 452*    BNP Recent Labs  Lab 09/22/19 2335  BNP 414.1*     Radiology    No results found.  Cardiac Studies   Echocardiogram 09/21/2019 Oval Linsey): LVEF 25% - Global hypokinesis RV moderately impaired systolic function LVEDD 6.4 cm eRVSP 40 mmHg Elevated LA pressure.  Moderate MR Mild to Moderate TR  Patient Profile     53 y.o. male 3 of controlled schizophrenia, CKD stage III-IV now presenting with a few week history of shortness of breath and vague palpitations, found to have a dilated cardiomyopathy and atrial flutter with RVR.  Assessment & Plan    1.  Atypical atrial flutter with RVR, largely 2:1 block at this time.  He  is on IV amiodarone with plan for TEE guided cardioversion on Monday.  I reviewed the risks and benefits with him, and he is in agreement to proceed.  Continue Eliquis for stroke prophylaxis. CHADSVASC score is 1 at this point.  2.  Dilated cardiomyopathy with LVEF approximately 25% and global hypokinesis.  Also moderately reduced RV contraction with RVSP estimated 40 mmHg by echocardiogram done at [facility].  Presumption is that this is tachycardia-mediated, although he has had no further work-up from a cardiac perspective to this point.  3.  At risk for OSA.  He will ultimately need to undergo a sleep study as an outpatient.  4.  CKD stage III-IV.  Would continue IV amiodarone.  Further efforts at heart rate control by escalating AV nodal blockers are likely to be unfruitful and may simply lead to hypotension.  His medications can be modified after he is back in sinus rhythm as it relates to optimization of heart failure regimen.  Continue Eliquis.  TEE guided  cardioversion being scheduled for Monday.  Signed, Rozann Lesches, MD  09/23/2019, 10:02 AM

## 2019-09-23 NOTE — Progress Notes (Signed)
Pt w/BP 134/103, HR 114. Pt comfortable.  MD made aware.  Will continue to monitor.

## 2019-09-23 NOTE — Progress Notes (Signed)
Patient has blood in urine, not noted on arrival. No external bleeding around scrotum or penis, patient asymptomatic. Will pass on to day shift   Tawanna Sat, RN 09/23/2019 4:45 AM

## 2019-09-23 NOTE — Progress Notes (Signed)
Came from [place] with a flutter. Heart been sustaining in the 130s. MD aware

## 2019-09-23 NOTE — Progress Notes (Signed)
Patient BP 168/114. MD ordered Hydralazine   Tawanna Sat, RN 09/23/2019 6:15 AM

## 2019-09-24 DIAGNOSIS — I484 Atypical atrial flutter: Secondary | ICD-10-CM

## 2019-09-24 LAB — CBC WITH DIFFERENTIAL/PLATELET
Abs Immature Granulocytes: 0.02 10*3/uL (ref 0.00–0.07)
Basophils Absolute: 0 10*3/uL (ref 0.0–0.1)
Basophils Relative: 1 %
Eosinophils Absolute: 0.2 10*3/uL (ref 0.0–0.5)
Eosinophils Relative: 3 %
HCT: 39.1 % (ref 39.0–52.0)
Hemoglobin: 12.8 g/dL — ABNORMAL LOW (ref 13.0–17.0)
Immature Granulocytes: 0 %
Lymphocytes Relative: 14 %
Lymphs Abs: 1.2 10*3/uL (ref 0.7–4.0)
MCH: 28.4 pg (ref 26.0–34.0)
MCHC: 32.7 g/dL (ref 30.0–36.0)
MCV: 86.9 fL (ref 80.0–100.0)
Monocytes Absolute: 1 10*3/uL (ref 0.1–1.0)
Monocytes Relative: 12 %
Neutro Abs: 5.9 10*3/uL (ref 1.7–7.7)
Neutrophils Relative %: 70 %
Platelets: 460 10*3/uL — ABNORMAL HIGH (ref 150–400)
RBC: 4.5 MIL/uL (ref 4.22–5.81)
RDW: 14.2 % (ref 11.5–15.5)
WBC: 8.3 10*3/uL (ref 4.0–10.5)
nRBC: 0 % (ref 0.0–0.2)

## 2019-09-24 LAB — COMPREHENSIVE METABOLIC PANEL
ALT: 39 U/L (ref 0–44)
AST: 33 U/L (ref 15–41)
Albumin: 3.2 g/dL — ABNORMAL LOW (ref 3.5–5.0)
Alkaline Phosphatase: 75 U/L (ref 38–126)
Anion gap: 14 (ref 5–15)
BUN: 37 mg/dL — ABNORMAL HIGH (ref 6–20)
CO2: 28 mmol/L (ref 22–32)
Calcium: 8.7 mg/dL — ABNORMAL LOW (ref 8.9–10.3)
Chloride: 97 mmol/L — ABNORMAL LOW (ref 98–111)
Creatinine, Ser: 2.88 mg/dL — ABNORMAL HIGH (ref 0.61–1.24)
GFR calc Af Amer: 28 mL/min — ABNORMAL LOW (ref >60)
GFR calc non Af Amer: 24 mL/min — ABNORMAL LOW (ref >60)
Glucose, Bld: 80 mg/dL (ref 70–99)
Potassium: 3.4 mmol/L — ABNORMAL LOW (ref 3.5–5.1)
Sodium: 139 mmol/L (ref 135–145)
Total Bilirubin: 1 mg/dL (ref 0.3–1.2)
Total Protein: 7 g/dL (ref 6.5–8.1)

## 2019-09-24 LAB — MAGNESIUM: Magnesium: 1.8 mg/dL (ref 1.7–2.4)

## 2019-09-24 LAB — PHOSPHORUS: Phosphorus: 3.6 mg/dL (ref 2.5–4.6)

## 2019-09-24 MED ORDER — ALBUMIN HUMAN 25 % IV SOLN
50.0000 g | Freq: Four times a day (QID) | INTRAVENOUS | Status: AC
  Administered 2019-09-24 – 2019-09-25 (×4): 50 g via INTRAVENOUS
  Filled 2019-09-24 (×5): qty 200

## 2019-09-24 MED ORDER — DILTIAZEM HCL-DEXTROSE 125-5 MG/125ML-% IV SOLN (PREMIX)
5.0000 mg/h | INTRAVENOUS | Status: DC
  Administered 2019-09-24: 10 mg/h via INTRAVENOUS
  Administered 2019-09-24: 5 mg/h via INTRAVENOUS
  Filled 2019-09-24 (×3): qty 125

## 2019-09-24 MED ORDER — POTASSIUM CHLORIDE CRYS ER 20 MEQ PO TBCR
40.0000 meq | EXTENDED_RELEASE_TABLET | Freq: Once | ORAL | Status: AC
  Administered 2019-09-24: 40 meq via ORAL
  Filled 2019-09-24: qty 2

## 2019-09-24 MED ORDER — DILTIAZEM LOAD VIA INFUSION
10.0000 mg | Freq: Once | INTRAVENOUS | Status: AC
  Administered 2019-09-24: 10:00:00 10 mg via INTRAVENOUS
  Filled 2019-09-24: qty 10

## 2019-09-24 NOTE — Progress Notes (Signed)
PROGRESS NOTE  Michael Mcknight ZYS:[id number] DOB: 04-07-1966 DOA: 09/22/2019 PCP: No primary care provider on file.  HPI/Recap of past 54 hours: 53 year old gentleman with history of a schizophrenia, no known previous problems, poor historian presented to [facility] emergency room 2 days ago with about 2 weeks history of not feeling well and shortness of breath.  No fever or chills.  No airway symptoms. He was admitted to [facility] with rapid a flutter, treated with amiodarone, EF was 25%, unable to achieve rate control and transferred to [facility]. Patient does not know his baseline renal functions.  He thinks he might have some kidney issues in the past.  He says his last checkup with his doctor was 3 years ago.  Patient not known to have congestive heart failure.  09/24/19: Patient was seen and examined at bedside this morning.  Reports palpitations and dizziness with standing.  No chest pain or dyspnea at rest.  Heart rate in the 140s on monitor in the room.  Suspect a flutter, will obtain twelve-lead EKG.  Blood pressure stable at this point.  We will start IV albumin infusion to maintain MAP greater than 65 while on Cardizem drip.  Cardizem bolus 10 mg IV once ordered.  We will continue to closely monitor.  Plan TEE cardioversion on 09/25/2019 per cardiology.  Assessment/Plan: Principal Problem:   Atrial fibrillation/flutter ([place]) Active Problems:   CKD (chronic kidney disease), stage III   Schizophrenia (HCC)   Cardiomegaly   Pleural effusion   Lobar pneumonia (HCC)   Benign essential HTN  A flutter with RVR Currently on amiodarone drip for rhythm control On Eliquis for CVA prevention This morning heart rate in the 140s and symptomatic with dizziness Obtain twelve-lead EKG IV team consulted for midline IV placement Ordered 10 mg IV Cardizem bolus and started Cardizem drip Started IV albumin 50 g every 6 hours x4 doses to maintain MAP greater than 65 while on Cardizem drip  Continue to closely monitor blood pressure and heart rate O2 saturation 96% on room air, no sign of pulmonary edema with RVR Cardiology following, planned TEE cardioversion on 09/25/2019  AKI on CKD 4 Creatinine improving 2.88 on 09/24/2019 from 3.11 on 09/23/2019 GFR 24 Unclear of baseline creatinine and GFR  Avoid nephrotoxins and hypotension Closely monitor urine output Daily BMP  Hypokalemia Potassium 3.4 Repleted with 1 dose p.o. potassium supplement  Chronic systolic CHF Last 2D echo revealed LVEF 25% Euvolemic on exam Continue strict I's and O's and daily weight Continue cardiac medications as recommended by cardiology Net I&O -8.6 L since admission Currently on IV Lasix 80 mg twice daily Continue to closely monitor electrolytes and renal function   Schizophrenia: Well controlled as per patient.  Lives at home with his sister.  Patient is on risperidone, Lexapro and trihexyphenidyl that I will resume.  RUL lung abnormalities on CT chest: Covid 19 negative: His CT scan without contrast done on admission showed some right middle lobe abnormalities.  No leukocytosis.  He has no cough or sputum production. Was treated with antibiotics in the hospital at [facility]. We will keep him off antibiotics and monitor.  Procalcitonin level negative Continue to keep off antibiotics Afebrile with no leukocytosis   DVT prophylaxis: Eliquis Code Status: Full code Family Communication: None Disposition Plan:  Will plan to DC once cardiology signs off and patient is hemodynamically stable.  Planned TEE cardioversion on 09/25/2019.   Consultants:   Cardiology  Procedures:   None  Antimicrobials:  None    Objective: Vitals:   09/23/19 1944 09/23/19 2355 09/24/19 0339 09/24/19 0800  BP: 120/89 128/79 135/89 (!) 130/98  Pulse: (!) 126 (!) 120 (!) 118 (!) 143  Resp: 19 20 18  (!) 25  Temp: 97.7 F (36.5 C) 99 F (37.2 C) 98.1 F (36.7 C) 98.2 F (36.8 C)  TempSrc:  Axillary Oral Oral Oral  SpO2: 95% 95% 91% 96%  Weight:      Height:        Intake/Output Summary (Last 24 hours) at 09/24/2019 5638 Last data filed at 09/24/2019 0344 Gross per 24 hour  Intake 240 ml  Output 5350 ml  Net -5110 ml   Filed Weights   09/23/19 0500  Weight: 111.5 kg    Exam:  . General: 53 y.o. year-old male well developed well nourished in no acute distress.  Alert and oriented x3. . Cardiovascular: Tachycardic with no rubs or gallops.  No thyromegaly or JVD noted.   [name] Kitchen Respiratory: Clear to auscultation with no wheezes or rales. Good inspiratory effort. . Abdomen: Soft nontender nondistended with normal bowel sounds x4 quadrants. . Musculoskeletal: No lower extremity edema. 2/4 pulses in all 4 extremities. [name] Kitchen Psychiatry: Mood is appropriate for condition and setting   Data Reviewed: CBC: Recent Labs  Lab 09/23/19 0247 09/24/19 0301  WBC 8.6 8.3  NEUTROABS  --  5.9  HGB 11.9* 12.8*  HCT 37.5* 39.1  MCV 89.5 86.9  PLT 452* 756*   Basic Metabolic Panel: Recent Labs  Lab 09/22/19 2313 09/23/19 0247 09/24/19 0301  NA 140 140 139  K 4.1 4.4 3.4*  CL 102 101 97*  CO2 28 27 28   GLUCOSE 92 100* 80  BUN 35* 35* 37*  CREATININE 3.09* 3.11* 2.88*  CALCIUM 8.6* 8.4* 8.7*  MG  --   --  1.8  PHOS  --   --  3.6   GFR: Estimated Creatinine Clearance: 35.8 mL/min (A) (by C-G formula based on SCr of 2.88 mg/dL (H)). Liver Function Tests: Recent Labs  Lab 09/22/19 2313 09/24/19 0301  AST 46* 33  ALT 46* 39  ALKPHOS 67 75  BILITOT 0.7 1.0  PROT 7.4 7.0  ALBUMIN 3.3* 3.2*   No results for input(s): LIPASE, AMYLASE in the last 168 hours. No results for input(s): AMMONIA in the last 168 hours. Coagulation Profile: No results for input(s): INR, PROTIME in the last 168 hours. Cardiac Enzymes: No results for input(s): CKTOTAL, CKMB, CKMBINDEX, TROPONINI in the last 168 hours. BNP (last 3 results) No results for input(s): PROBNP in the last 8760 hours.  HbA1C: Recent Labs    09/23/19 0247  HGBA1C 6.1*   CBG: No results for input(s): GLUCAP in the last 168 hours. Lipid Profile: Recent Labs    09/23/19 0247  CHOL 138  HDL 35*  LDLCALC 86  TRIG 84  CHOLHDL 3.9   Thyroid Function Tests: Recent Labs    09/22/19 2313  TSH 1.463   Anemia Panel: No results for input(s): VITAMINB12, FOLATE, FERRITIN, TIBC, IRON, RETICCTPCT in the last 72 hours. Urine analysis:    Component Value Date/Time   COLORURINE RED (A) 09/23/2019 1827   APPEARANCEUR CLOUDY (A) 09/23/2019 1827   LABSPEC 1.025 09/23/2019 1827   PHURINE 5.5 09/23/2019 1827   GLUCOSEU NEGATIVE 09/23/2019 1827   HGBUR LARGE (A) 09/23/2019 1827   BILIRUBINUR SMALL (A) 09/23/2019 1827   KETONESUR NEGATIVE 09/23/2019 1827   PROTEINUR 30 (A) 09/23/2019 1827   NITRITE POSITIVE (A) 09/23/2019  Laura (A) 09/23/2019 1827   Sepsis Labs: @LABRCNTIP (procalcitonin:4,lacticidven:4)  )No results found for this or any previous visit (from the past 240 hour(s)).    Studies: No results found.  Scheduled Meds: . apixaban  5 mg Oral BID  . Chlorhexidine Gluconate Cloth  6 each Topical Daily  . diltiazem  10 mg Intravenous Once  . escitalopram  10 mg Oral Daily  . furosemide  80 mg Intravenous BID  . isosorbide dinitrate  10 mg Oral Q8H  . risperiDONE  4 mg Oral QHS  . trihexyphenidyl  2 mg Oral BID WC    Continuous Infusions: . albumin human    . amiodarone 30 mg/hr (09/24/19 0644)  . diltiazem (CARDIZEM) infusion       LOS: 2 days     Kayleen Memos, MD [facility] Pager [phone]  If 7PM-7AM, please contact night-coverage [email or web address] Password [facility] 09/24/2019, 8:32 AM

## 2019-09-24 NOTE — Progress Notes (Signed)
Progress Note  Patient Name: Michael Mcknight Date of Encounter: 09/24/2019  Primary Cardiologist: Shirlee More, MD  Subjective   Patient reports no chest pain or palpitations, no shortness of breath or orthopnea.  Inpatient Medications    Scheduled Meds: . apixaban  5 mg Oral BID  . Chlorhexidine Gluconate Cloth  6 each Topical Daily  . diltiazem  10 mg Intravenous Once  . escitalopram  10 mg Oral Daily  . furosemide  80 mg Intravenous BID  . isosorbide dinitrate  10 mg Oral Q8H  . risperiDONE  4 mg Oral QHS  . trihexyphenidyl  2 mg Oral BID WC   Continuous Infusions: . albumin human    . amiodarone 30 mg/hr (09/24/19 0644)  . diltiazem (CARDIZEM) infusion     PRN Meds: acetaminophen, ondansetron (ZOFRAN) IV   Vital Signs    Vitals:   09/23/19 1944 09/23/19 2355 09/24/19 0339 09/24/19 0800  BP: 120/89 128/79 135/89 (!) 130/98  Pulse: (!) 126 (!) 120 (!) 118 (!) 143  Resp: 19 20 18  (!) 25  Temp: 97.7 F (36.5 C) 99 F (37.2 C) 98.1 F (36.7 C) 98.2 F (36.8 C)  TempSrc: Axillary Oral Oral Oral  SpO2: 95% 95% 91% 96%  Weight:      Height:        Intake/Output Summary (Last 24 hours) at 09/24/2019 0931 Last data filed at 09/24/2019 0344 Gross per 24 hour  Intake 240 ml  Output 4250 ml  Net -4010 ml   Filed Weights   09/23/19 0500  Weight: 111.5 kg    Telemetry    Atrial flutter.  Personally reviewed.  ECG    An ECG dated 09/23/2019 was personally reviewed today and demonstrated:  Atrial flutter with 2:1 block and incomplete right bundle branch block.  Physical Exam   GEN:  Obese male.  No acute distress.   Neck:  Increased girth. Cardiac:  Irregular rhythm,, no obvious gallop.  Respiratory:  Diminished breath sounds without wheezing. GI: Soft, nontender, bowel sounds present. MS: No edema; No deformity. Neuro:  Nonfocal. Psych: Alert and oriented x 3.  Somewhat flat affect.  Labs    Chemistry Recent Labs  Lab 09/22/19 2313 09/23/19  0247 09/24/19 0301  NA 140 140 139  K 4.1 4.4 3.4*  CL 102 101 97*  CO2 28 27 28   GLUCOSE 92 100* 80  BUN 35* 35* 37*  CREATININE 3.09* 3.11* 2.88*  CALCIUM 8.6* 8.4* 8.7*  PROT 7.4  --  7.0  ALBUMIN 3.3*  --  3.2*  AST 46*  --  33  ALT 46*  --  39  ALKPHOS 67  --  75  BILITOT 0.7  --  1.0  GFRNONAA 22* 22* 24*  GFRAA 26* 25* 28*  ANIONGAP 10 12 14      Hematology Recent Labs  Lab 09/23/19 0247 09/24/19 0301  WBC 8.6 8.3  RBC 4.19* 4.50  HGB 11.9* 12.8*  HCT 37.5* 39.1  MCV 89.5 86.9  MCH 28.4 28.4  MCHC 31.7 32.7  RDW 14.7 14.2  PLT 452* 460*    BNP Recent Labs  Lab 09/22/19 2335  BNP 414.1*     Radiology    No results found.  Cardiac Studies   Echocardiogram 09/21/2019 Oval Linsey): LVEF 25% - Global hypokinesis RV moderately impaired systolic function LVEDD 6.4 cm eRVSP 40 mmHg Elevated LA pressure.  Moderate MR Mild to Moderate TR  Patient Profile     53 y.o. male 3  of controlled schizophrenia, CKD stage III-IV now presenting with a few week history of shortness of breath and vague palpitations, found to have a dilated cardiomyopathy and atrial flutter with RVR.  Assessment & Plan    1.  Atypical atrial flutter with RVR.  He is on IV amiodarone and was also started on IV Cardizem per primary team.  Still has RVR which is not surprising with atrial flutter.  Plan for TEE guided cardioversion on Monday.  I reviewed the risks and benefits with him, and he is in agreement to proceed.  Continue Eliquis for stroke prophylaxis. CHADSVASC score is 1 at this point.  2.  Dilated cardiomyopathy with LVEF approximately 25% and global hypokinesis.  Also moderately reduced RV contraction with RVSP estimated 40 mmHg by echocardiogram done at [facility].  Presumption is that this is tachycardia-mediated, although he has had no further work-up from a cardiac perspective to this point.  Holding on initiation and titration of cardiomyopathy regimen until he gets back in  sinus rhythm.  3.  At risk for OSA.  He will ultimately need to undergo a sleep study as an outpatient.  4.  CKD stage III-IV.  Continue Eliquis, IV amiodarone, and IV Cardizem (although would not push this too aggressively since his heart rate is unlikely to be well controlled in atrial flutter anyway, and would prefer to avoid hypotension or substantial bradycardia following cardioversion).  Plan is for TEE guided cardioversion tomorrow.  Signed, Rozann Lesches, MD  09/24/2019, 9:31 AM

## 2019-09-24 NOTE — Progress Notes (Signed)
Dr. Nevada Crane in room and assessed patient new orders rec'd. Patient in bed awaiting new medications. Pt resting with call bell within reach.  Will continue to monitor.

## 2019-09-24 NOTE — Progress Notes (Signed)
Called for Midline placement. Midline not recommended due to patient receiving I.V. vesicant medications. PIV's  placed.

## 2019-09-25 DIAGNOSIS — I4891 Unspecified atrial fibrillation: Secondary | ICD-10-CM

## 2019-09-25 DIAGNOSIS — I5041 Acute combined systolic (congestive) and diastolic (congestive) heart failure: Secondary | ICD-10-CM

## 2019-09-25 DIAGNOSIS — I4892 Unspecified atrial flutter: Secondary | ICD-10-CM

## 2019-09-25 LAB — BASIC METABOLIC PANEL
Anion gap: 14 (ref 5–15)
BUN: 36 mg/dL — ABNORMAL HIGH (ref 6–20)
CO2: 29 mmol/L (ref 22–32)
Calcium: 9.1 mg/dL (ref 8.9–10.3)
Chloride: 96 mmol/L — ABNORMAL LOW (ref 98–111)
Creatinine, Ser: 3.03 mg/dL — ABNORMAL HIGH (ref 0.61–1.24)
GFR calc Af Amer: 26 mL/min — ABNORMAL LOW (ref >60)
GFR calc non Af Amer: 23 mL/min — ABNORMAL LOW (ref >60)
Glucose, Bld: 118 mg/dL — ABNORMAL HIGH (ref 70–99)
Potassium: 3.4 mmol/L — ABNORMAL LOW (ref 3.5–5.1)
Sodium: 139 mmol/L (ref 135–145)

## 2019-09-25 MED ORDER — SODIUM CHLORIDE 0.9% FLUSH: 3.0000 mL | INTRAVENOUS | Status: DC | PRN

## 2019-09-25 MED ORDER — SODIUM CHLORIDE 0.9 % IV SOLN: INTRAVENOUS | Status: DC

## 2019-09-25 MED ORDER — POTASSIUM CHLORIDE CRYS ER 20 MEQ PO TBCR
40.0000 meq | EXTENDED_RELEASE_TABLET | Freq: Every day | ORAL | Status: DC
  Administered 2019-09-25 – 2019-09-28 (×4): 40 meq via ORAL
  Filled 2019-09-25 (×4): qty 2

## 2019-09-25 MED ORDER — SODIUM CHLORIDE 0.9 % IV SOLN
INTRAVENOUS | Status: DC
  Administered 2019-09-26: 05:00:00 via INTRAVENOUS

## 2019-09-25 MED ORDER — SODIUM CHLORIDE 0.9% FLUSH
3.0000 mL | Freq: Two times a day (BID) | INTRAVENOUS | Status: DC
  Administered 2019-09-25 – 2019-09-28 (×7): 3 mL via INTRAVENOUS

## 2019-09-25 MED ORDER — SODIUM CHLORIDE 0.9 % IV SOLN
1.0000 g | INTRAVENOUS | Status: DC
  Administered 2019-09-26 – 2019-09-27 (×2): 1 g via INTRAVENOUS
  Filled 2019-09-25: qty 10
  Filled 2019-09-25: qty 1
  Filled 2019-09-25: qty 10
  Filled 2019-09-25: qty 1

## 2019-09-25 MED ORDER — FUROSEMIDE 10 MG/ML IJ SOLN
80.0000 mg | Freq: Every day | INTRAMUSCULAR | Status: DC
  Administered 2019-09-26: 80 mg via INTRAVENOUS
  Filled 2019-09-25: qty 8

## 2019-09-25 MED ORDER — SODIUM CHLORIDE 0.9 % IV SOLN: 250.0000 mL | INTRAVENOUS | Status: DC

## 2019-09-25 MED ORDER — SODIUM CHLORIDE 0.9% FLUSH
3.0000 mL | Freq: Two times a day (BID) | INTRAVENOUS | Status: DC
  Administered 2019-09-25 – 2019-09-28 (×6): 3 mL via INTRAVENOUS

## 2019-09-25 NOTE — Progress Notes (Signed)
Transitions of Care Pharmacist Note  Michael Mcknight is a 53 y.o. male that has been diagnosed with A Fib and will be prescribed Eliquis (apixaban) at discharge.   Patient Education: I provided the following education on 09/25/19 to the patient: How to take the medication Described what the medication is Signs of bleeding Signs/symptoms of VTE and stroke  Answered their questions  Discharge Medications Plan: The patient wants to have their discharge medications filled by the Transitions of Care pharmacy rather than their usual pharmacy.  The discharge orders pharmacy has been changed to the Transitions of Care pharmacy, the patient will receive a phone call regarding co-pay, and their medications will be delivered by the Transitions of Care pharmacy.    Thank you,   Sherren Kerns, PharmD PGY1 Acute Care Pharmacy Resident September 25, 2019

## 2019-09-25 NOTE — Progress Notes (Signed)
Amio drip running. Pt scheduled for TEE. Rate consistently tachycardic

## 2019-09-25 NOTE — Progress Notes (Addendum)
PROGRESS NOTE  Michael Mcknight VPX:[id number] DOB: 05/20/1966 DOA: 09/22/2019 PCP: No primary care provider on file.  HPI/Recap of past 21 hours: 53 year old gentleman with history of a schizophrenia, no known previous problems, poor historian presented to [facility] emergency room 2 days ago with about 2 weeks history of not feeling well and shortness of breath.  No fever or chills.  No airway symptoms. He was admitted to [facility] with rapid a flutter, treated with amiodarone, EF was 25%, unable to achieve rate control and transferred to [facility]. Patient does not know his baseline renal functions.  He thinks he might have some kidney issues in the past.  He says his last checkup with his doctor was 3 years ago.  Patient not known to have congestive heart failure.   09/25/19: Patient was seen and examined at his bedside this morning.  No acute events overnight.  In atypical atrial flutter with RVR.  Seen by cardiology with plan for cardioversion tomorrow 10/06/2019.  He denies any chest pain or dyspnea at rest.  No palpitations at this time.    Assessment/Plan: Principal Problem:   Atrial fibrillation/flutter ([place]) Active Problems:   CKD (chronic kidney disease), stage III   Schizophrenia (HCC)   Cardiomegaly   Pleural effusion   Lobar pneumonia (HCC)   Benign essential HTN   Atypical atrial flutter (HCC)  Atypical A flutter with RVR Currently on amiodarone drip for rhythm control On Eliquis for CVA prevention Persistent atypical a flutter with RVR Cardiology managing Possible cardioversion on 09/26/2019 Continue heparin drip and amiodarone drip Continue to closely monitor on telemetry  AKI on CKD 4 Creatinine 3.03 with GFR of 23 Creatinine 2.88 on 09/24/2019 from 3.11 on 09/23/2019 GFR 24 IV Lasix held by by cardiology Continue to monitor urine output Continue to avoid nephrotoxins and hypotension Continue daily BMPs  Refractory hypokalemia Potassium 3.4 Started daily  KCl p.o. 40 mEq We will closely monitor now that IV Lasix has been cut back Continue daily BMPs  Presumed UTI U/A taken on 09/23/19 positive for pyuria Afebrile with no leukocytosis Obtain urine culture on 09/25/19 to confirm UTI Will treat empirically until urine culture results. DC antibiotics if urine culture shows no or insignificant growth.  Chronic systolic CHF Last 2D echo revealed LVEF 25% Euvolemic on exam Continue strict I's and O's and daily weight Continue cardiac medications as recommended by cardiology Net I&O -12.3 L since admission IV Lasix cut back to 80 mg daily from 80 mg twice daily.  Schizophrenia: Well controlled as per patient.  Lives at home with his sister.  Patient is on risperidone, Lexapro and trihexyphenidyl that I will resume.  RUL lung abnormalities on CT chest: Covid 19 negative: His CT scan without contrast done on admission showed some right middle lobe abnormalities.  No leukocytosis.  He has no cough or sputum production. Was treated with antibiotics in the hospital at [facility]. We will keep him off antibiotics and monitor.  Procalcitonin level negative Continue to keep off antibiotics Afebrile with no leukocytosis   DVT prophylaxis: Eliquis Code Status: Full code Family Communication: None Disposition Plan:  Will plan to DC once cardiology signs off and patient is hemodynamically stable.  Planned TEE cardioversion on 09/26/2019.   Consultants:   Cardiology  Procedures:   None  Antimicrobials:   None    Objective: Vitals:   09/25/19 0326 09/25/19 0618 09/25/19 0800 09/25/19 1215  BP: 106/79 124/77 110/68 112/80  Pulse: 100 (!) 133 (!) 132 [name] Kitchen)  133  Resp: 15 15 13 20   Temp: 98 F (36.7 C)  97.6 F (36.4 C) 98.5 F (36.9 C)  TempSrc: Oral  Oral Oral  SpO2: 94% 95% 100% 100%  Weight: 104.8 kg     Height:        Intake/Output Summary (Last 24 hours) at 09/25/2019 1357 Last data filed at 09/25/2019 1248 Gross per 24  hour  Intake 1787.68 ml  Output 4676 ml  Net -2888.32 ml   Filed Weights   09/23/19 0500 09/25/19 0326  Weight: 111.5 kg 104.8 kg    Exam:  . General: 53 y.o. year-old male pleasant well-developed well-nourished no acute distress.  Alert oriented x4.  Cardiovascular: Tachycardic with no rubs or gallops.   [name] Kitchen Respiratory: Clear to auscultation no wheezes or rales.   . Abdomen: Soft nontender nondistended with bowel sounds present. . Musculoskeletal: Trace lower extremity edema.   [name] Kitchen Psychiatry: Mood is appropriate for condition and setting.   Data Reviewed: CBC: Recent Labs  Lab 09/23/19 0247 09/24/19 0301  WBC 8.6 8.3  NEUTROABS  --  5.9  HGB 11.9* 12.8*  HCT 37.5* 39.1  MCV 89.5 86.9  PLT 452* 778*   Basic Metabolic Panel: Recent Labs  Lab 09/22/19 2313 09/23/19 0247 09/24/19 0301 09/25/19 0247  NA 140 140 139 139  K 4.1 4.4 3.4* 3.4*  CL 102 101 97* 96*  CO2 28 27 28 29   GLUCOSE 92 100* 80 118*  BUN 35* 35* 37* 36*  CREATININE 3.09* 3.11* 2.88* 3.03*  CALCIUM 8.6* 8.4* 8.7* 9.1  MG  --   --  1.8  --   PHOS  --   --  3.6  --    GFR: Estimated Creatinine Clearance: 32.9 mL/min (A) (by C-G formula based on SCr of 3.03 mg/dL (H)). Liver Function Tests: Recent Labs  Lab 09/22/19 2313 09/24/19 0301  AST 46* 33  ALT 46* 39  ALKPHOS 67 75  BILITOT 0.7 1.0  PROT 7.4 7.0  ALBUMIN 3.3* 3.2*   No results for input(s): LIPASE, AMYLASE in the last 168 hours. No results for input(s): AMMONIA in the last 168 hours. Coagulation Profile: No results for input(s): INR, PROTIME in the last 168 hours. Cardiac Enzymes: No results for input(s): CKTOTAL, CKMB, CKMBINDEX, TROPONINI in the last 168 hours. BNP (last 3 results) No results for input(s): PROBNP in the last 8760 hours. HbA1C: Recent Labs    09/23/19 0247  HGBA1C 6.1*   CBG: No results for input(s): GLUCAP in the last 168 hours. Lipid Profile: Recent Labs    09/23/19 0247  CHOL 138  HDL 35*   LDLCALC 86  TRIG 84  CHOLHDL 3.9   Thyroid Function Tests: Recent Labs    09/22/19 2313  TSH 1.463   Anemia Panel: No results for input(s): VITAMINB12, FOLATE, FERRITIN, TIBC, IRON, RETICCTPCT in the last 72 hours. Urine analysis:    Component Value Date/Time   COLORURINE RED (A) 09/23/2019 1827   APPEARANCEUR CLOUDY (A) 09/23/2019 1827   LABSPEC 1.025 09/23/2019 1827   PHURINE 5.5 09/23/2019 1827   GLUCOSEU NEGATIVE 09/23/2019 1827   HGBUR LARGE (A) 09/23/2019 1827   BILIRUBINUR SMALL (A) 09/23/2019 1827   KETONESUR NEGATIVE 09/23/2019 1827   PROTEINUR 30 (A) 09/23/2019 1827   NITRITE POSITIVE (A) 09/23/2019 1827   LEUKOCYTESUR TRACE (A) 09/23/2019 1827   Sepsis Labs: @LABRCNTIP (procalcitonin:4,lacticidven:4)  )No results found for this or any previous visit (from the past 240 hour(s)).    Studies:  No results found.  Scheduled Meds: . apixaban  5 mg Oral BID  . Chlorhexidine Gluconate Cloth  6 each Topical Daily  . escitalopram  10 mg Oral Daily  . [START ON 09/26/2019] furosemide  80 mg Intravenous Daily  . isosorbide dinitrate  10 mg Oral Q8H  . potassium chloride  40 mEq Oral Daily  . risperiDONE  4 mg Oral QHS  . sodium chloride flush  3 mL Intravenous Q12H  . sodium chloride flush  3 mL Intravenous Q12H  . trihexyphenidyl  2 mg Oral BID WC    Continuous Infusions: . sodium chloride    . sodium chloride    . sodium chloride    . amiodarone 30 mg/hr (09/25/19 0523)     LOS: 3 days     Kayleen Memos, MD [facility] Pager [phone]  If 7PM-7AM, please contact night-coverage [email or web address] Password [email or web address] 09/25/2019, 1:57 PM

## 2019-09-25 NOTE — H&P (View-Only) (Signed)
Progress Note  Patient Name: Michael Mcknight Date of Encounter: 09/25/2019  Primary Cardiologist: Shirlee More, MD new   Subjective   Pt transferred for TEE DCCV from [facility].   Inpatient Medications    Scheduled Meds: . apixaban  5 mg Oral BID  . Chlorhexidine Gluconate Cloth  6 each Topical Daily  . escitalopram  10 mg Oral Daily  . furosemide  80 mg Intravenous BID  . isosorbide dinitrate  10 mg Oral Q8H  . potassium chloride  40 mEq Oral Daily  . risperiDONE  4 mg Oral QHS  . sodium chloride flush  3 mL Intravenous Q12H  . trihexyphenidyl  2 mg Oral BID WC   Continuous Infusions: . sodium chloride    . sodium chloride    . amiodarone 30 mg/hr (09/25/19 0523)   PRN Meds: acetaminophen, ondansetron (ZOFRAN) IV, sodium chloride flush   Vital Signs    Vitals:   09/25/19 0024 09/25/19 0326 09/25/19 0618 09/25/19 0800  BP: (!) 166/66 106/79 124/77 110/68  Pulse: (!) 120 100 (!) 133 (!) 132  Resp: 19 15 15 13   Temp: 98.4 F (36.9 C) 98 F (36.7 C)  97.6 F (36.4 C)  TempSrc: Axillary Oral  Oral  SpO2: 96% 94% 95% 100%  Weight:  104.8 kg    Height:        Intake/Output Summary (Last 24 hours) at 09/25/2019 1008 Last data filed at 09/25/2019 0825 Gross per 24 hour  Intake 2027.68 ml  Output 4700 ml  Net -2672.32 ml   Last 3 Weights 09/25/2019 09/23/2019  Weight (lbs) 231 lb 0.7 oz 245 lb 13 oz  Weight (kg) 104.8 kg 111.5 kg      Telemetry    A fib flutter in night 107-115 but now 130-140 due to increased activity - Personally Reviewed  ECG    No new - Personally Reviewed  Physical Exam   GEN: No acute distress.   Neck: No JVD Cardiac: irreg irreg, no murmurs, rubs, or gallops.  Respiratory: Clear to auscultation bilaterally. GI: Soft, nontender, non-distended  MS: No edema; No deformity. Neuro:  Nonfocal  Psych: Normal affect   Labs    High Sensitivity Troponin:  No results for input(s): TROPONINIHS in the last 720 hours.     Chemistry Recent Labs  Lab 09/22/19 2313 09/23/19 0247 09/24/19 0301 09/25/19 0247  NA 140 140 139 139  K 4.1 4.4 3.4* 3.4*  CL 102 101 97* 96*  CO2 28 27 28 29   GLUCOSE 92 100* 80 118*  BUN 35* 35* 37* 36*  CREATININE 3.09* 3.11* 2.88* 3.03*  CALCIUM 8.6* 8.4* 8.7* 9.1  PROT 7.4  --  7.0  --   ALBUMIN 3.3*  --  3.2*  --   AST 46*  --  33  --   ALT 46*  --  39  --   ALKPHOS 67  --  75  --   BILITOT 0.7  --  1.0  --   GFRNONAA 22* 22* 24* 23*  GFRAA 26* 25* 28* 26*  ANIONGAP 10 12 14 14      Hematology Recent Labs  Lab 09/23/19 0247 09/24/19 0301  WBC 8.6 8.3  RBC 4.19* 4.50  HGB 11.9* 12.8*  HCT 37.5* 39.1  MCV 89.5 86.9  MCH 28.4 28.4  MCHC 31.7 32.7  RDW 14.7 14.2  PLT 452* 460*    BNP Recent Labs  Lab 09/22/19 2335  BNP 414.1*  DDimer No results for input(s): DDIMER in the last 168 hours.   Radiology    No results found.  Cardiac Studies    Echo at [facility] with EF 25% and global hypokinesis.   TEE DCCV in next few days.  Patient Profile     53 y.o. male of controlled schizophrenia, CKD stage III-IV now presenting with a few week history of shortness of breath and vague palpitations, found to have a dilated cardiomyopathy and atrial flutter with RVR.  Assessment & Plan    1.  Atypical atrial flutter with RVR.  He is on IV amiodarone and was also started on IV Cardizem per primary team.  Still has RVR which is not surprising with atrial flutter.  Plan for TEE guided cardioversion when we can place on schedule.  Dr. Domenic Polite reviewed the risks and benefits with him, and he is in agreement to proceed.  Continue Eliquis for stroke prophylaxis. CHADSVASC score is 1 at this point.  2.  Dilated cardiomyopathy with LVEF approximately 25% and global hypokinesis.  Also moderately reduced RV contraction with RVSP estimated 40 mmHg by echocardiogram done at [facility].  Presumption is that this is tachycardia-mediated, although he has had no further  work-up from a cardiac perspective to this point.  Holding on initiation and titration of cardiomyopathy regimen until he gets back in sinus rhythm.  On isordil 10 mg every 8 hours.   3.  Acute systolic HF in combination with a fib.  On lasix 80 mg IV BID  May need to decrease today  - neg [id number] since admit and wt down from 111.5 to 104.8 Kg (loss of 14.74 lbs)   4.  At risk for OSA.  He will ultimately need to undergo a sleep study as an outpatient.  5.  CKD stage III-IV. Cr 3.09>>3.11>> 2.88>> today 3.03   6   Hypokalemia at 3/4 and Mg+ yesterday 1.8 replacement ordered by IM  7.  UTI per IM      For questions or updates, please contact [place] Please consult [email or web address] for contact info under        Signed, Cecilie Kicks, NP  09/25/2019, 10:08 AM

## 2019-09-25 NOTE — Progress Notes (Signed)
Progress Note  Patient Name: Michael Mcknight Date of Encounter: 09/25/2019  Primary Cardiologist: Shirlee More, MD new   Subjective   Pt transferred for TEE DCCV from [facility].   Inpatient Medications    Scheduled Meds: . apixaban  5 mg Oral BID  . Chlorhexidine Gluconate Cloth  6 each Topical Daily  . escitalopram  10 mg Oral Daily  . furosemide  80 mg Intravenous BID  . isosorbide dinitrate  10 mg Oral Q8H  . potassium chloride  40 mEq Oral Daily  . risperiDONE  4 mg Oral QHS  . sodium chloride flush  3 mL Intravenous Q12H  . trihexyphenidyl  2 mg Oral BID WC   Continuous Infusions: . sodium chloride    . sodium chloride    . amiodarone 30 mg/hr (09/25/19 0523)   PRN Meds: acetaminophen, ondansetron (ZOFRAN) IV, sodium chloride flush   Vital Signs    Vitals:   09/25/19 0024 09/25/19 0326 09/25/19 0618 09/25/19 0800  BP: (!) 166/66 106/79 124/77 110/68  Pulse: (!) 120 100 (!) 133 (!) 132  Resp: 19 15 15 13   Temp: 98.4 F (36.9 C) 98 F (36.7 C)  97.6 F (36.4 C)  TempSrc: Axillary Oral  Oral  SpO2: 96% 94% 95% 100%  Weight:  104.8 kg    Height:        Intake/Output Summary (Last 24 hours) at 09/25/2019 1008 Last data filed at 09/25/2019 0825 Gross per 24 hour  Intake 2027.68 ml  Output 4700 ml  Net -2672.32 ml   Last 3 Weights 09/25/2019 09/23/2019  Weight (lbs) 231 lb 0.7 oz 245 lb 13 oz  Weight (kg) 104.8 kg 111.5 kg      Telemetry    A fib flutter in night 107-115 but now 130-140 due to increased activity - Personally Reviewed  ECG    No new - Personally Reviewed  Physical Exam   GEN: No acute distress.   Neck: No JVD Cardiac: irreg irreg, no murmurs, rubs, or gallops.  Respiratory: Clear to auscultation bilaterally. GI: Soft, nontender, non-distended  MS: No edema; No deformity. Neuro:  Nonfocal  Psych: Normal affect   Labs    High Sensitivity Troponin:  No results for input(s): TROPONINIHS in the last 720 hours.     Chemistry Recent Labs  Lab 09/22/19 2313 09/23/19 0247 09/24/19 0301 09/25/19 0247  NA 140 140 139 139  K 4.1 4.4 3.4* 3.4*  CL 102 101 97* 96*  CO2 28 27 28 29   GLUCOSE 92 100* 80 118*  BUN 35* 35* 37* 36*  CREATININE 3.09* 3.11* 2.88* 3.03*  CALCIUM 8.6* 8.4* 8.7* 9.1  PROT 7.4  --  7.0  --   ALBUMIN 3.3*  --  3.2*  --   AST 46*  --  33  --   ALT 46*  --  39  --   ALKPHOS 67  --  75  --   BILITOT 0.7  --  1.0  --   GFRNONAA 22* 22* 24* 23*  GFRAA 26* 25* 28* 26*  ANIONGAP 10 12 14 14      Hematology Recent Labs  Lab 09/23/19 0247 09/24/19 0301  WBC 8.6 8.3  RBC 4.19* 4.50  HGB 11.9* 12.8*  HCT 37.5* 39.1  MCV 89.5 86.9  MCH 28.4 28.4  MCHC 31.7 32.7  RDW 14.7 14.2  PLT 452* 460*    BNP Recent Labs  Lab 09/22/19 2335  BNP 414.1*  DDimer No results for input(s): DDIMER in the last 168 hours.   Radiology    No results found.  Cardiac Studies    Echo at [facility] with EF 25% and global hypokinesis.   TEE DCCV in next few days.  Patient Profile     53 y.o. male of controlled schizophrenia, CKD stage III-IV now presenting with a few week history of shortness of breath and vague palpitations, found to have a dilated cardiomyopathy and atrial flutter with RVR.  Assessment & Plan    1.  Atypical atrial flutter with RVR.  He is on IV amiodarone and was also started on IV Cardizem per primary team.  Still has RVR which is not surprising with atrial flutter.  Plan for TEE guided cardioversion when we can place on schedule.  Dr. Domenic Polite reviewed the risks and benefits with him, and he is in agreement to proceed.  Continue Eliquis for stroke prophylaxis. CHADSVASC score is 1 at this point.  2.  Dilated cardiomyopathy with LVEF approximately 25% and global hypokinesis.  Also moderately reduced RV contraction with RVSP estimated 40 mmHg by echocardiogram done at [facility].  Presumption is that this is tachycardia-mediated, although he has had no further  work-up from a cardiac perspective to this point.  Holding on initiation and titration of cardiomyopathy regimen until he gets back in sinus rhythm.  On isordil 10 mg every 8 hours.   3.  Acute systolic HF in combination with a fib.  On lasix 80 mg IV BID  May need to decrease today  - neg [id number] since admit and wt down from 111.5 to 104.8 Kg (loss of 14.74 lbs)   4.  At risk for OSA.  He will ultimately need to undergo a sleep study as an outpatient.  5.  CKD stage III-IV. Cr 3.09>>3.11>> 2.88>> today 3.03   6   Hypokalemia at 3/4 and Mg+ yesterday 1.8 replacement ordered by IM  7.  UTI per IM      For questions or updates, please contact [place] Please consult [email or web address] for contact info under        Signed, Cecilie Kicks, NP  09/25/2019, 10:08 AM

## 2019-09-25 NOTE — Discharge Instructions (Signed)

## 2019-09-26 ENCOUNTER — Inpatient Hospital Stay (HOSPITAL_COMMUNITY): Payer: Medicare Other | Admitting: Anesthesiology

## 2019-09-26 ENCOUNTER — Encounter (HOSPITAL_COMMUNITY): Payer: Self-pay | Admitting: *Deleted

## 2019-09-26 ENCOUNTER — Encounter (HOSPITAL_COMMUNITY)
Admission: AD | Disposition: A | Discharge status: Home / Self Care | Payer: Self-pay | Source: Other Acute Inpatient Hospital | Attending: Internal Medicine

## 2019-09-26 ENCOUNTER — Inpatient Hospital Stay (HOSPITAL_COMMUNITY): Payer: Medicare Other

## 2019-09-26 DIAGNOSIS — I429 Cardiomyopathy, unspecified: Secondary | ICD-10-CM

## 2019-09-26 DIAGNOSIS — I4892 Unspecified atrial flutter: Secondary | ICD-10-CM

## 2019-09-26 DIAGNOSIS — I340 Nonrheumatic mitral (valve) insufficiency: Secondary | ICD-10-CM

## 2019-09-26 HISTORY — PX: CARDIOVERSION: SHX1299

## 2019-09-26 HISTORY — PX: TEE WITHOUT CARDIOVERSION: SHX5443

## 2019-09-26 LAB — PROTIME-INR
INR: 1.8 — ABNORMAL HIGH (ref 0.8–1.2)
Prothrombin Time: 20.5 seconds — ABNORMAL HIGH (ref 11.4–15.2)

## 2019-09-26 LAB — BASIC METABOLIC PANEL
Anion gap: 13 (ref 5–15)
BUN: 46 mg/dL — ABNORMAL HIGH (ref 6–20)
CO2: 29 mmol/L (ref 22–32)
Calcium: 9.4 mg/dL (ref 8.9–10.3)
Chloride: 96 mmol/L — ABNORMAL LOW (ref 98–111)
Creatinine, Ser: 3.37 mg/dL — ABNORMAL HIGH (ref 0.61–1.24)
GFR calc Af Amer: 23 mL/min — ABNORMAL LOW (ref >60)
GFR calc non Af Amer: 20 mL/min — ABNORMAL LOW (ref >60)
Glucose, Bld: 106 mg/dL — ABNORMAL HIGH (ref 70–99)
Potassium: 3.7 mmol/L (ref 3.5–5.1)
Sodium: 138 mmol/L (ref 135–145)

## 2019-09-26 LAB — MAGNESIUM: Magnesium: 2.1 mg/dL (ref 1.7–2.4)

## 2019-09-26 SURGERY — ECHOCARDIOGRAM, TRANSESOPHAGEAL
Anesthesia: Monitor Anesthesia Care

## 2019-09-26 MED ORDER — FUROSEMIDE 40 MG PO TABS
40.0000 mg | ORAL_TABLET | Freq: Every day | ORAL | Status: DC
  Administered 2019-09-27 – 2019-09-28 (×2): 40 mg via ORAL
  Filled 2019-09-26 (×2): qty 1

## 2019-09-26 MED ORDER — PROPOFOL 500 MG/50ML IV EMUL
INTRAVENOUS | Status: DC | PRN
  Administered 2019-09-26: 150 ug/kg/min via INTRAVENOUS

## 2019-09-26 MED ORDER — AMIODARONE HCL 200 MG PO TABS
400.0000 mg | ORAL_TABLET | Freq: Two times a day (BID) | ORAL | Status: DC
  Administered 2019-09-26 – 2019-09-28 (×4): 400 mg via ORAL
  Filled 2019-09-26 (×4): qty 2

## 2019-09-26 MED ORDER — PROPOFOL 10 MG/ML IV BOLUS
INTRAVENOUS | Status: DC | PRN
  Administered 2019-09-26: 20 mg via INTRAVENOUS

## 2019-09-26 MED ORDER — SODIUM CHLORIDE 0.9 % IV SOLN
INTRAVENOUS | Status: DC | PRN
  Administered 2019-09-26: 11:00:00 via INTRAVENOUS

## 2019-09-26 NOTE — Interval H&P Note (Signed)
History and Physical Interval Note:  09/26/2019 10:29 AM  Michael Mcknight  has presented today for surgery, with the diagnosis of afib.  The various methods of treatment have been discussed with the patient and family. After consideration of risks, benefits and other options for treatment, the patient has consented to  Procedure(s): TRANSESOPHAGEAL ECHOCARDIOGRAM (TEE) (N/A) CARDIOVERSION (N/A) as a surgical intervention.  The patient's history has been reviewed, patient examined, no change in status, stable for surgery.  I have reviewed the patient's chart and labs.  Questions were answered to the patient's satisfaction.     Mertie Moores

## 2019-09-26 NOTE — Progress Notes (Addendum)
Progress Note  Patient Name: Michael Mcknight Date of Encounter: 09/26/2019  Primary Cardiologist: Shirlee More, MD new   Subjective   Sleeping comfortably in bed this AM. Mask was off while he was sleeping, O2 sats 99-100. Turned O2 off and maintained O2 sats. He was on room air yesterday, unclear why placed back on ventimask. Denies shortness of breath, chest pain. Rates remain in the 130s.  Inpatient Medications    Scheduled Meds:  apixaban  5 mg Oral BID   Chlorhexidine Gluconate Cloth  6 each Topical Daily   escitalopram  10 mg Oral Daily   furosemide  80 mg Intravenous Daily   isosorbide dinitrate  10 mg Oral Q8H   potassium chloride  40 mEq Oral Daily   risperiDONE  4 mg Oral QHS   sodium chloride flush  3 mL Intravenous Q12H   sodium chloride flush  3 mL Intravenous Q12H   trihexyphenidyl  2 mg Oral BID WC   Continuous Infusions:  sodium chloride 20 mL/hr at 09/26/19 0505   sodium chloride     sodium chloride     sodium chloride     amiodarone 30 mg/hr (09/26/19 0541)   cefTRIAXone (ROCEPHIN)  IV     PRN Meds: acetaminophen, ondansetron (ZOFRAN) IV, sodium chloride flush, sodium chloride flush   Vital Signs    Vitals:   09/26/19 0414 09/26/19 0500 09/26/19 0800 09/26/19 0822  BP: (!) 147/80  122/86 122/86  Pulse: 72 (!) 126 (!) 132   Resp: (!) 22 19 18    Temp: 98.6 F (37 C)  98.3 F (36.8 C)   TempSrc: Oral  Oral   SpO2: 99% 98% 100%   Weight:      Height:        Intake/Output Summary (Last 24 hours) at 09/26/2019 1027 Last data filed at 09/26/2019 0415 Gross per 24 hour  Intake 852.13 ml  Output 1726 ml  Net -873.87 ml   Last 3 Weights 09/25/2019 09/23/2019  Weight (lbs) 231 lb 0.7 oz 245 lb 13 oz  Weight (kg) 104.8 kg 111.5 kg      Telemetry    Atrial flutter, variable rates but today in 130s - Personally Reviewed  ECG    Aflutter with 2:1 conduction - Personally Reviewed  Physical Exam   GEN: Well nourished, well  developed in no acute distress HEENT: Normal, moist mucous membranes NECK: JVD flat CARDIAC: tachycardic, irregular rhythm, normal S1 and S2, no rubs or gallops. No murmurs appreciated. VASCULAR: Radial and DP pulses 2+ bilaterally. No carotid bruits RESPIRATORY:  Clear to auscultation without rales, wheezing or rhonchi bilaterally ABDOMEN: Soft, non-tender, non-distended MUSCULOSKELETAL:  Moves all 4 limbs independently. SKIN: Warm and dry, no edema NEUROLOGIC:  Alert and oriented. No focal neuro deficits noted. PSYCHIATRIC:  Normal affect   Labs    High Sensitivity Troponin:  No results for input(s): TROPONINIHS in the last 720 hours.    Chemistry Recent Labs  Lab 09/22/19 2313  09/24/19 0301 09/25/19 0247 09/26/19 0225  NA 140   < > 139 139 138  K 4.1   < > 3.4* 3.4* 3.7  CL 102   < > 97* 96* 96*  CO2 28   < > 28 29 29   GLUCOSE 92   < > 80 118* 106*  BUN 35*   < > 37* 36* 46*  CREATININE 3.09*   < > 2.88* 3.03* 3.37*  CALCIUM 8.6*   < > 8.7* 9.1 9.4  PROT 7.4  --  7.0  --   --   ALBUMIN 3.3*  --  3.2*  --   --   AST 46*  --  33  --   --   ALT 46*  --  39  --   --   ALKPHOS 67  --  75  --   --   BILITOT 0.7  --  1.0  --   --   GFRNONAA 22*   < > 24* 23* 20*  GFRAA 26*   < > 28* 26* 23*  ANIONGAP 10   < > 14 14 13    < > = values in this interval not displayed.     Hematology Recent Labs  Lab 09/23/19 0247 09/24/19 0301  WBC 8.6 8.3  RBC 4.19* 4.50  HGB 11.9* 12.8*  HCT 37.5* 39.1  MCV 89.5 86.9  MCH 28.4 28.4  MCHC 31.7 32.7  RDW 14.7 14.2  PLT 452* 460*    BNP Recent Labs  Lab 09/22/19 2335  BNP 414.1*     DDimer No results for input(s): DDIMER in the last 168 hours.   Radiology    No results found.  Cardiac Studies   Echo at [facility] with EF 25% and global hypokinesis.   Patient Profile     53 y.o. male of controlled schizophrenia, CKD stage III-IV now presenting with a few week history of shortness of breath and vague palpitations,  found to have a dilated cardiomyopathy and atrial flutter with RVR.  Assessment & Plan    Atypical atrial flutter with RVR -continues to have rapid rate despite IV amiodarone. Will change to oral load after cardioversion. Not ideal to be on amiodarone long term given his young age, but with reduced EF and his comorbidities, will use in the short term to maintain sinus rhythm. May need to discuss EP/ablation in the future if this persists. -discussed TEE-CV yesterday, he is amenable. Able to tell me back what the procedure is. Planned for this today. -O2 sats fine on room air, unclear why he was back on ventimask this AM -continue apixaban for at least 4 weeks post cardioversion -aim for K about 4 (cautious repletion given CKD) and Mg >2.  Dilated cardiomyopathy with LVEF approximately 25% and global hypokinesis.  Also moderately reduced RV contraction with RVSP estimated 40 mmHg by echocardiogram done at [facility]. Acute systolic and diastolic heart failure, new diagnosis this admission. -thought to be tachycardia-mediated. Will aim for restoration of sinus rhythm, then titrate cardiomyopathy meds -will need outpatient follow up and discussion of ischemia evaluation soon vs. After repeat echo if sinus rhythm maintained. -currently on isordil 10 q8 hours -changing to oral daily lasix, to start tomorrow -net negative almost 13L, weight at admission 111.5 kg, no weight yet today but yesterday 104.8 kg.  Snoring, at risk for OSA -He will ultimately need to undergo a sleep study as an outpatient.  CKD stage III-IV -baseline Cr this admission around 3, climbing today. Will change diuretics.    For questions or updates, please contact [place] Please consult [email or web address] for contact info under     Signed, Buford Dresser, MD  09/26/2019, 10:27 AM

## 2019-09-26 NOTE — Care Management Important Message (Signed)
Important Message  Patient Details  Name: Michael Mcknight MRN: [id number] Date of Birth: 11-Feb-1966   Medicare Important Message Given:  Yes     Shelda Altes 09/26/2019, 1:22 PM

## 2019-09-26 NOTE — Progress Notes (Signed)
  Echocardiogram Echocardiogram Transesophageal has been performed.  Michael Mcknight 09/26/2019, 11:25 AM

## 2019-09-26 NOTE — Progress Notes (Addendum)
PROGRESS NOTE  LON KLIPPEL FBP:[id number] DOB: May 19, 1966 DOA: 09/22/2019 PCP: Patient, No Pcp Per  HPI/Recap of past 28 hours: 53 year old gentleman with history of a schizophrenia, no known previous problems, poor historian presented to [facility] emergency room 2 days ago with about 2 weeks history of not feeling well and shortness of breath.  No fever or chills.  No airway symptoms. He was admitted to [facility] with rapid a flutter, treated with amiodarone, EF was 25%, unable to achieve rate control and transferred to [facility]. Patient does not know his baseline renal functions.  He thinks he might have some kidney issues in the past.  He says his last checkup with his doctor was 3 years ago.  Patient not known to have congestive heart failure.   09/26/19: Patient was seen and examined at bedside this morning.  Sleeping with loud snores.  Has not been evaluated for OSA in the past.  TEE directed cardioversion completed today.   Assessment/Plan: Principal Problem:   Atrial fibrillation/flutter ([place]) Active Problems:   CKD (chronic kidney disease), stage III   Schizophrenia (HCC)   Cardiomegaly   Pleural effusion   Lobar pneumonia (HCC)   Benign essential HTN   Atypical atrial flutter ([place])  Post TEE directed cardioversion on 09/26/2019 for atypical A flutter with RVR On amiodarone for rhythm control and on Eliquis for CVA prevention Management per cardiology  AKI on CKD 4 -Worsening 3.37 on 09/26/2019 with GFR of 20. Net I&O -13.3 L Goal for nephrotoxins and hypotension Continue BMP daily  Resolved refractory hypokalemia Potassium 3.7 on 09/26/2019 Continue daily KCl p.o. 40 mEq We will closely monitor now that IV Lasix has been cut back Continue daily BMPs  Presumed UTI in the setting of indwelling Foley catheter U/A taken on 09/23/19 positive for pyuria Afebrile with no leukocytosis Obtain urine culture on 09/25/19 to confirm UTI Urine culture in process Continue  to treat empirically until urine culture results. DC antibiotics if urine culture shows no or insignificant growth. Has Foley catheter in place.  States he follows with urology outpatient.  Chronic systolic CHF Last 2D echo revealed LVEF 25% Euvolemic on exam Continue strict I's and O's and daily weight Continue cardiac medications as recommended by cardiology Net I&O -13.3 L IV Lasix cut back to 80 mg daily from 80 mg twice daily.  Schizophrenia: Well controlled as per patient.  Lives at home with his sister.  Patient is on risperidone, Lexapro and trihexyphenidyl that I will resume.  RUL lung abnormalities on CT chest: Covid 19 negative: His CT scan without contrast done on admission showed some right middle lobe abnormalities.  No leukocytosis.  He has no cough or sputum production. Was treated with antibiotics in the hospital at [facility]. We will keep him off antibiotics and monitor.  Procalcitonin level negative Continue to keep off antibiotics Afebrile with no leukocytosis  Suspected OSA based on body habitus, neck circumference and loud snores with possible daytime somnolence Recommend polysomnography outpatient   DVT prophylaxis: Eliquis Code Status: Full code Family Communication: None Disposition Plan:  Will plan to DC once cardiology signs off and patient is hemodynamically stable.    Consultants:   Cardiology  Procedures:   TEE directed cardioversion on 09/26/2019.  Antimicrobials:   None    Objective: Vitals:   09/26/19 1039 09/26/19 1131 09/26/19 1145 09/26/19 1251  BP: 125/87 (!) 115/48 112/72 126/90  Pulse: (!) 134   97  Resp: (!) 26 20 14 17   Temp:  98.2 F (36.8 C) 98.4 F (36.9 C)  98 F (36.7 C)  TempSrc: Oral Oral  Oral  SpO2: 96% 98% 98% 99%  Weight:      Height:        Intake/Output Summary (Last 24 hours) at 09/26/2019 1515 Last data filed at 09/26/2019 1300 Gross per 24 hour  Intake 647.33 ml  Output 1850 ml  Net -1202.67 ml    Filed Weights   09/23/19 0500 09/25/19 0326  Weight: 111.5 kg 104.8 kg    Exam:  . General: 53 y.o. year-old male obese no acute distress.  Somnolent easily arousable.  Oriented x4.   Cardiovascular: Tachycardic no rubs or gallops. [name] Kitchen Respiratory: Clear to Auscultation No Wheezes or Rales.   . Abdomen: Obese nontender nondistended normal bowel sounds present.   . Musculoskeletal: Trace lower extremity edema. Psychiatry: Mood is appropriate for condition and setting.  Data Reviewed: CBC: Recent Labs  Lab 09/23/19 0247 09/24/19 0301  WBC 8.6 8.3  NEUTROABS  --  5.9  HGB 11.9* 12.8*  HCT 37.5* 39.1  MCV 89.5 86.9  PLT 452* 542*   Basic Metabolic Panel: Recent Labs  Lab 09/22/19 2313 09/23/19 0247 09/24/19 0301 09/25/19 0247 09/26/19 0225  NA 140 140 139 139 138  K 4.1 4.4 3.4* 3.4* 3.7  CL 102 101 97* 96* 96*  CO2 28 27 28 29 29   GLUCOSE 92 100* 80 118* 106*  BUN 35* 35* 37* 36* 46*  CREATININE 3.09* 3.11* 2.88* 3.03* 3.37*  CALCIUM 8.6* 8.4* 8.7* 9.1 9.4  MG  --   --  1.8  --  2.1  PHOS  --   --  3.6  --   --    GFR: Estimated Creatinine Clearance: 29.6 mL/min (A) (by C-G formula based on SCr of 3.37 mg/dL (H)). Liver Function Tests: Recent Labs  Lab 09/22/19 2313 09/24/19 0301  AST 46* 33  ALT 46* 39  ALKPHOS 67 75  BILITOT 0.7 1.0  PROT 7.4 7.0  ALBUMIN 3.3* 3.2*   No results for input(s): LIPASE, AMYLASE in the last 168 hours. No results for input(s): AMMONIA in the last 168 hours. Coagulation Profile: Recent Labs  Lab 09/26/19 0225  INR 1.8*   Cardiac Enzymes: No results for input(s): CKTOTAL, CKMB, CKMBINDEX, TROPONINI in the last 168 hours. BNP (last 3 results) No results for input(s): PROBNP in the last 8760 hours. HbA1C: No results for input(s): HGBA1C in the last 72 hours. CBG: No results for input(s): GLUCAP in the last 168 hours. Lipid Profile: No results for input(s): CHOL, HDL, LDLCALC, TRIG, CHOLHDL, LDLDIRECT in the last 72  hours. Thyroid Function Tests: No results for input(s): TSH, T4TOTAL, FREET4, T3FREE, THYROIDAB in the last 72 hours. Anemia Panel: No results for input(s): VITAMINB12, FOLATE, FERRITIN, TIBC, IRON, RETICCTPCT in the last 72 hours. Urine analysis:    Component Value Date/Time   COLORURINE RED (A) 09/23/2019 1827   APPEARANCEUR CLOUDY (A) 09/23/2019 1827   LABSPEC 1.025 09/23/2019 1827   PHURINE 5.5 09/23/2019 1827   GLUCOSEU NEGATIVE 09/23/2019 1827   HGBUR LARGE (A) 09/23/2019 1827   BILIRUBINUR SMALL (A) 09/23/2019 1827   KETONESUR NEGATIVE 09/23/2019 1827   PROTEINUR 30 (A) 09/23/2019 1827   NITRITE POSITIVE (A) 09/23/2019 1827   LEUKOCYTESUR TRACE (A) 09/23/2019 1827   Sepsis Labs: @LABRCNTIP (procalcitonin:4,lacticidven:4)  )No results found for this or any previous visit (from the past 240 hour(s)).    Studies: No results found.  Scheduled Meds: . apixaban  5 mg Oral BID  . Chlorhexidine Gluconate Cloth  6 each Topical Daily  . escitalopram  10 mg Oral Daily  . furosemide  80 mg Intravenous Daily  . isosorbide dinitrate  10 mg Oral Q8H  . potassium chloride  40 mEq Oral Daily  . risperiDONE  4 mg Oral QHS  . sodium chloride flush  3 mL Intravenous Q12H  . sodium chloride flush  3 mL Intravenous Q12H  . trihexyphenidyl  2 mg Oral BID WC    Continuous Infusions: . sodium chloride    . sodium chloride    . amiodarone 30 mg/hr (09/26/19 0541)  . cefTRIAXone (ROCEPHIN)  IV       LOS: 4 days     Kayleen Memos, MD [facility] Pager [phone]  If 7PM-7AM, please contact night-coverage [email or web address] Password [email or web address] 09/26/2019, 3:15 PM

## 2019-09-26 NOTE — Anesthesia Preprocedure Evaluation (Addendum)
Anesthesia Evaluation  Patient identified by MRN, date of birth, ID band Patient awake    Reviewed: Allergy & Precautions, NPO status , Patient's Chart, lab work & pertinent test results  History of Anesthesia Complications Negative for: history of anesthetic complications  Airway Mallampati: IV  TM Distance: >3 FB Neck ROM: Full    Dental  (+) Missing,    Pulmonary neg pulmonary ROS,    breath sounds clear to auscultation       Cardiovascular + dysrhythmias Atrial Fibrillation  Rhythm:Irregular Rate:Tachycardia     Neuro/Psych Schizophrenia negative neurological ROS     GI/Hepatic negative GI ROS, Neg liver ROS,   Endo/Other  negative endocrine ROS  Renal/GU Renal InsufficiencyRenal disease (Cr 3.37, K 3.7)  negative genitourinary   Musculoskeletal negative musculoskeletal ROS (+)   Abdominal   Peds  Hematology negative hematology ROS (+)   Anesthesia Other Findings Day of surgery medications reviewed with patient.  Reproductive/Obstetrics negative OB ROS                            Anesthesia Physical Anesthesia Plan  ASA: III  Anesthesia Plan: MAC   Post-op Pain Management:    Induction:   PONV Risk Score and Plan: 1 and Treatment may vary due to age or medical condition and Propofol infusion  Airway Management Planned: Natural Airway and Nasal Cannula  Additional Equipment: None  Intra-op Plan:   Post-operative Plan:   Informed Consent: I have reviewed the patients History and Physical, chart, labs and discussed the procedure including the risks, benefits and alternatives for the proposed anesthesia with the patient or authorized representative who has indicated his/her understanding and acceptance.     Dental advisory given  Plan Discussed with: CRNA  Anesthesia Plan Comments:        Anesthesia Quick Evaluation

## 2019-09-26 NOTE — CV Procedure (Addendum)
    Transesophageal Echocardiogram Note  Michael Mcknight [id number] 1966/03/10  Procedure: Transesophageal Echocardiogram Indications: atrial flutter   Procedure Details Consent: Obtained Time Out: Verified patient identification, verified procedure, site/side was marked, verified correct patient position, special equipment/implants available, Radiology Safety Procedures followed,  medications/allergies/relevent history reviewed, required imaging and test results available.  Performed  Medications:  During this procedure the patient is administered a Propofol drip - total of 250 mg by CRNA Baker   Left Ventrical:  Mod - severe reduction in LV function EF 25-30%   Mitral Valve: mild - mod MR   Aortic Valve: normal valve   Tricuspid Valve: mod TR .  Max velocity of TR jet 3 m/s  Pulmonic Valve: normal   Left Atrium/ Left atrial appendage: no thrombi   Atrial septum: no asd or PFO by color flow   Aorta: normal    Complications: No apparent complications Patient did tolerate procedure well.     Cardioversion Note  Michael Mcknight [id number] 06-13-1966  Procedure: DC Cardioversion Indications: a  Procedure Details Consent: Obtained Time Out: Verified patient identification, verified procedure, site/side was marked, verified correct patient position, special equipment/implants available, Radiology Safety Procedures followed,  medications/allergies/relevent history reviewed, required imaging and test results available.  Performed  The patient has been on adequate anticoagulation.  TEE revealed absence of LAA thrombi  The patient received IV propofol ( see above )  for sedation.  Synchronous cardioversion was performed at  200  joules.  The cardioversion was successful     Complications: No apparent complications Patient did tolerate procedure well.   Thayer Headings, Brooke Bonito., MD, [facility] 09/28/2019, 2:59 PM       Thayer Headings, Brooke Bonito., MD, [facility] 09/26/2019,  11:18 AM

## 2019-09-26 NOTE — Transfer of Care (Signed)
Immediate Anesthesia Transfer of Care Note  Patient: Michael Mcknight  Procedure(s) Performed: TRANSESOPHAGEAL ECHOCARDIOGRAM (TEE) (N/A ) CARDIOVERSION (N/A )  Patient Location: Endoscopy Unit  Anesthesia Type:MAC  Level of Consciousness: awake, drowsy and patient cooperative  Airway & Oxygen Therapy: Patient Spontanous Breathing and Patient connected to nasal cannula oxygen  Post-op Assessment: Report given to RN, Post -op Vital signs reviewed and stable and Patient moving all extremities X 4  Post vital signs: Reviewed and stable  Last Vitals:  Vitals Value Taken Time  BP    Temp    Pulse    Resp    SpO2      Last Pain:  Vitals:   09/26/19 1039  TempSrc: Oral  PainSc: 0-No pain         Complications: No apparent anesthesia complications

## 2019-09-26 NOTE — Anesthesia Postprocedure Evaluation (Signed)
Anesthesia Post Note  Patient: Michael Mcknight  Procedure(s) Performed: TRANSESOPHAGEAL ECHOCARDIOGRAM (TEE) (N/A ) CARDIOVERSION (N/A )     Anesthesia Post Evaluation  Last Vitals:  Vitals:   09/26/19 1039 09/26/19 1131  BP: 125/87 (!) 115/48  Pulse: (!) 134   Resp: (!) 26 20  Temp: 36.8 C 36.9 C  SpO2: 96% 98%    Last Pain:  Vitals:   09/26/19 1131  TempSrc: Oral  PainSc: 0-No pain                 Brennan Bailey

## 2019-09-26 NOTE — Plan of Care (Signed)
  Problem: Education: Goal: Knowledge of disease or condition will improve Outcome: Not Progressing   

## 2019-09-27 LAB — BASIC METABOLIC PANEL
Anion gap: 13 (ref 5–15)
BUN: 57 mg/dL — ABNORMAL HIGH (ref 6–20)
CO2: 27 mmol/L (ref 22–32)
Calcium: 9.3 mg/dL (ref 8.9–10.3)
Chloride: 98 mmol/L (ref 98–111)
Creatinine, Ser: 3.62 mg/dL — ABNORMAL HIGH (ref 0.61–1.24)
GFR calc Af Amer: 21 mL/min — ABNORMAL LOW (ref >60)
GFR calc non Af Amer: 18 mL/min — ABNORMAL LOW (ref >60)
Glucose, Bld: 106 mg/dL — ABNORMAL HIGH (ref 70–99)
Potassium: 4 mmol/L (ref 3.5–5.1)
Sodium: 138 mmol/L (ref 135–145)

## 2019-09-27 LAB — URINE CULTURE: Culture: NO GROWTH

## 2019-09-27 NOTE — Plan of Care (Signed)
  Problem: Education: Goal: Knowledge of disease or condition will improve Outcome: Not Progressing   

## 2019-09-27 NOTE — Progress Notes (Signed)
[name] Kitchen  PROGRESS NOTE    Michael Mcknight  MPN:[id number] DOB: 1966-01-08 DOA: 09/22/2019 PCP: Patient, No Pcp Per   Brief Narrative:   53 year old gentleman with history of a schizophrenia, no known previous problems, poor historian presented to [facility] emergency room 2 days ago with about 2 weeks history of not feeling well and shortness of breath. No fever or chills. No airway symptoms. He was admitted to [facility] with rapid a flutter, treated with amiodarone, EF was 25%, unable to achieve rate control and transferred to [facility]. Patient does not know his baseline renal functions. He thinks he might have some kidney issues in the past. He says his last checkup with his doctor was 3 years ago. Patient not known to have congestive heart failure.   11/11: No acute events ON. Cards has s/o'd. Attempting to get in contact with family for discharge to them as he lives with sister. Thus far, unsuccessful. Needs Scr to level out before d/c    Assessment & Plan:   Principal Problem:   Atrial fibrillation/flutter ([place]) Active Problems:   CKD (chronic kidney disease), stage III   Schizophrenia (HCC)   Cardiomegaly   Pleural effusion   Lobar pneumonia (HCC)   Benign essential HTN   Atypical atrial flutter (HCC)   Atypical a flutter w/ RVR     - s/p TEE directed cardioversion on 09/26/2019     - per cards:          - Continue Eliquis 5mg  twice daily for a least 4 weeks post-cardioversion.         - Amiodarone load: 400 mg BID for one week, then 200 mg BID for one week, then 200 mg daily.         - Other recommendations (labs, testing, etc):  will need LFTs, TFTs, PFTs as outpatient if planned for long term amiodarone use         - Follow up as an outpatient:  We will arrange for follow up with Dr. Bettina Gavia.  AKI on CKD 4     - SCr up to 3.62 today     - 14.7L down since admission     - now on lasix 40mg  daily; watch SCr, need it to stable out  Hypokalemia     - resolved     -  continue daily KCl p.o. 40 mEq  Pyuria in the setting of indwelling Foley catheter     - U/A taken on 09/23/19 positive for pyuria     - Afebrile with no leukocytosis     - UCx negative; stop rocephin     - continue to follow w/ urology outpatient  Chronic systolic CHF     - TEE EF 30-35%     - Euvolemic on exam     - 14.7L down since admission     - lasix 40mg  qday, isordil 10mg  TID      Schizophrenia     - Lives at home with his sister.     - risperidone, Lexapro and trihexyphenidyl  RUL lung abnormalities on CT chest:      - Covid 19 negative     - His CT scan without contrast done on admission showed some right middle lobe abnormalities.      - No leukocytosis, cough or sputum production.     - Was treated with antibiotics in the hospital at [facility].     - Procalcitonin level negative     - no further  abx needed at this time  Suspected OSA      - based on body habitus, neck circumference and loud snores with possible daytime somnolence     - Recommend polysomnography outpatient  DVT prophylaxis: Elquis Code Status: FULL   Disposition Plan: To home after renal function stablizes  Consultants:   Cardiology  Procedures:   TEE cardioversion  ROS:  Denies CP, palpitations, N, V, ab pain . Remainder 10-pt ROS is negative for all not previously mentioned.  Subjective: "It's good. It's ok."  Objective: Vitals:   09/26/19 2041 09/27/19 0009 09/27/19 0354 09/27/19 0740  BP: 133/85 (!) 142/88 (!) 144/95 136/88  Pulse: (!) 105 98 92 93  Resp: 19 (!) 22 (!) 23 (!) 25  Temp: 99 F (37.2 C) 98.9 F (37.2 C) 99 F (37.2 C) 98 F (36.7 C)  TempSrc: Oral Oral Oral Oral  SpO2: 91% 95% 94% 95%  Weight:      Height:        Intake/Output Summary (Last 24 hours) at 09/27/2019 1608 Last data filed at 09/27/2019 0900 Gross per 24 hour  Intake 860.92 ml  Output 2200 ml  Net -1339.08 ml   Filed Weights   09/23/19 0500 09/25/19 0326  Weight: 111.5 kg 104.8 kg     Examination:  General: 53 y.o. male resting in bed in NAD Cardiovascular: RRR, +S1, S2, no m/g/r, equal pulses throughout Respiratory: CTABL, no w/r/r, normal WOB GI: BS+, NDNT, no masses noted, no organomegaly noted MSK: No e/c/c Neuro: alert to name, follows commands Psyc: Appropriate interaction, calm/cooperative   Data Reviewed: I have personally reviewed following labs and imaging studies.  CBC: Recent Labs  Lab 09/23/19 0247 09/24/19 0301  WBC 8.6 8.3  NEUTROABS  --  5.9  HGB 11.9* 12.8*  HCT 37.5* 39.1  MCV 89.5 86.9  PLT 452* 938*   Basic Metabolic Panel: Recent Labs  Lab 09/23/19 0247 09/24/19 0301 09/25/19 0247 09/26/19 0225 09/27/19 0225  NA 140 139 139 138 138  K 4.4 3.4* 3.4* 3.7 4.0  CL 101 97* 96* 96* 98  CO2 27 28 29 29 27   GLUCOSE 100* 80 118* 106* 106*  BUN 35* 37* 36* 46* 57*  CREATININE 3.11* 2.88* 3.03* 3.37* 3.62*  CALCIUM 8.4* 8.7* 9.1 9.4 9.3  MG  --  1.8  --  2.1  --   PHOS  --  3.6  --   --   --    GFR: Estimated Creatinine Clearance: 27.6 mL/min (A) (by C-G formula based on SCr of 3.62 mg/dL (H)). Liver Function Tests: Recent Labs  Lab 09/22/19 2313 09/24/19 0301  AST 46* 33  ALT 46* 39  ALKPHOS 67 75  BILITOT 0.7 1.0  PROT 7.4 7.0  ALBUMIN 3.3* 3.2*   No results for input(s): LIPASE, AMYLASE in the last 168 hours. No results for input(s): AMMONIA in the last 168 hours. Coagulation Profile: Recent Labs  Lab 09/26/19 0225  INR 1.8*   Cardiac Enzymes: No results for input(s): CKTOTAL, CKMB, CKMBINDEX, TROPONINI in the last 168 hours. BNP (last 3 results) No results for input(s): PROBNP in the last 8760 hours. HbA1C: No results for input(s): HGBA1C in the last 72 hours. CBG: No results for input(s): GLUCAP in the last 168 hours. Lipid Profile: No results for input(s): CHOL, HDL, LDLCALC, TRIG, CHOLHDL, LDLDIRECT in the last 72 hours. Thyroid Function Tests: No results for input(s): TSH, T4TOTAL, FREET4, T3FREE,  THYROIDAB in the last 72 hours. Anemia Panel:  No results for input(s): VITAMINB12, FOLATE, FERRITIN, TIBC, IRON, RETICCTPCT in the last 72 hours. Sepsis Labs: Recent Labs  Lab 09/23/19 0247  PROCALCITON 0.25    Recent Results (from the past 240 hour(s))  Culture, Urine     Status: None   Collection Time: 09/26/19 12:18 AM   Specimen: Urine, Clean Catch  Result Value Ref Range Status   Specimen Description URINE, CLEAN CATCH  Final   Special Requests NONE  Final   Culture   Final    NO GROWTH Performed at [facility], [address] [place],  [address]    Report Status 09/27/2019 FINAL  Final      Radiology Studies: No results found.   Scheduled Meds: . amiodarone  400 mg Oral BID  . apixaban  5 mg Oral BID  . Chlorhexidine Gluconate Cloth  6 each Topical Daily  . escitalopram  10 mg Oral Daily  . furosemide  40 mg Oral Daily  . isosorbide dinitrate  10 mg Oral Q8H  . potassium chloride  40 mEq Oral Daily  . risperiDONE  4 mg Oral QHS  . sodium chloride flush  3 mL Intravenous Q12H  . sodium chloride flush  3 mL Intravenous Q12H  . trihexyphenidyl  2 mg Oral BID WC   Continuous Infusions: . sodium chloride    . sodium chloride    . cefTRIAXone (ROCEPHIN)  IV 1 g (09/26/19 1730)     LOS: 5 days    Time spent: 25 minutes spent in the coordination of care today.    Jonnie Finner, DO [facility] Pager [phone]  If 7PM-7AM, please contact night-coverage [email or web address] Password [email or web address] 09/27/2019, 4:08 PM

## 2019-09-27 NOTE — Plan of Care (Signed)
  Problem: Education: Goal: Knowledge of General Education information will improve Description: Including pain rating scale, medication(s)/side effects and non-pharmacologic comfort measures 09/27/2019 1311 by Glenard Haring, RN Outcome: Progressing 09/27/2019 1311 by Glenard Haring, RN Outcome: Progressing   Problem: Education: Goal: Knowledge of disease or condition will improve 09/27/2019 1311 by Glenard Haring, RN Outcome: Progressing 09/27/2019 1311 by Glenard Haring, RN Outcome: Progressing Goal: Understanding of medication regimen will improve 09/27/2019 1311 by Glenard Haring, RN Outcome: Progressing 09/27/2019 1311 by Glenard Haring, RN Outcome: Progressing Goal: Individualized Educational Video(s) 09/27/2019 1311 by Glenard Haring, RN Outcome: Progressing 09/27/2019 1311 by Glenard Haring, RN Outcome: Progressing

## 2019-09-27 NOTE — Progress Notes (Signed)
Progress Note  Patient Name: Michael Mcknight Date of Encounter: 09/27/2019  Primary Cardiologist: Shirlee More, MD   Subjective   No acute overnight events. Patient doing well today. No chest pain, shortness of breath, palpitations, lightheadedness, or dizziness. He thinks his breathing is back to baseline. He has been able to ambulate without any significant problems.   Inpatient Medications    Scheduled Meds: . amiodarone  400 mg Oral BID  . apixaban  5 mg Oral BID  . Chlorhexidine Gluconate Cloth  6 each Topical Daily  . escitalopram  10 mg Oral Daily  . furosemide  40 mg Oral Daily  . isosorbide dinitrate  10 mg Oral Q8H  . potassium chloride  40 mEq Oral Daily  . risperiDONE  4 mg Oral QHS  . sodium chloride flush  3 mL Intravenous Q12H  . sodium chloride flush  3 mL Intravenous Q12H  . trihexyphenidyl  2 mg Oral BID WC   Continuous Infusions: . sodium chloride    . sodium chloride    . cefTRIAXone (ROCEPHIN)  IV 1 g (09/26/19 1730)   PRN Meds: acetaminophen, ondansetron (ZOFRAN) IV, sodium chloride flush, sodium chloride flush   Vital Signs    Vitals:   09/26/19 2041 09/27/19 0009 09/27/19 0354 09/27/19 0740  BP: 133/85 (!) 142/88 (!) 144/95 136/88  Pulse: (!) 105 98 92 93  Resp: 19 (!) 22 (!) 23 (!) 25  Temp: 99 F (37.2 C) 98.9 F (37.2 C) 99 F (37.2 C) 98 F (36.7 C)  TempSrc: Oral Oral Oral Oral  SpO2: 91% 95% 94% 95%  Weight:      Height:        Intake/Output Summary (Last 24 hours) at 09/27/2019 1208 Last data filed at 09/27/2019 4010 Gross per 24 hour  Intake 620.92 ml  Output 2750 ml  Net -2129.08 ml   Last 3 Weights 09/25/2019 09/23/2019  Weight (lbs) 231 lb 0.7 oz 245 lb 13 oz  Weight (kg) 104.8 kg 111.5 kg      Telemetry    Normal sinus rhythm with rates in 80 to low 100's. PVCs noted.  - Personally Reviewed  ECG    No new EKG tracing today. - Personally Reviewed  Physical Exam   GEN: Obese Caucasian male resting  comfortably in no acute distress.   Neck: Supple. No JVD. Cardiac: RRR. No murmurs, rubs, or gallops. Radial pulses 2+ and equal bilaterally.  Respiratory: Clear to auscultation bilaterally. No wheezes, rhonchi, or rales. GI: Soft, nontender, non-distended. Bowel sounds present.  MS: No lower extremity edema. No deformity. Skin: Warm and dry. Neuro:  No focal deficits. Psych: Normal affect. Responds appropriately.  Labs    High Sensitivity Troponin:  No results for input(s): TROPONINIHS in the last 720 hours.    Chemistry Recent Labs  Lab 09/22/19 2313  09/24/19 0301 09/25/19 0247 09/26/19 0225 09/27/19 0225  NA 140   < > 139 139 138 138  K 4.1   < > 3.4* 3.4* 3.7 4.0  CL 102   < > 97* 96* 96* 98  CO2 28   < > 28 29 29 27   GLUCOSE 92   < > 80 118* 106* 106*  BUN 35*   < > 37* 36* 46* 57*  CREATININE 3.09*   < > 2.88* 3.03* 3.37* 3.62*  CALCIUM 8.6*   < > 8.7* 9.1 9.4 9.3  PROT 7.4  --  7.0  --   --   --  ALBUMIN 3.3*  --  3.2*  --   --   --   AST 46*  --  33  --   --   --   ALT 46*  --  39  --   --   --   ALKPHOS 67  --  75  --   --   --   BILITOT 0.7  --  1.0  --   --   --   GFRNONAA 22*   < > 24* 23* 20* 18*  GFRAA 26*   < > 28* 26* 23* 21*  ANIONGAP 10   < > 14 14 13 13    < > = values in this interval not displayed.     Hematology Recent Labs  Lab 09/23/19 0247 09/24/19 0301  WBC 8.6 8.3  RBC 4.19* 4.50  HGB 11.9* 12.8*  HCT 37.5* 39.1  MCV 89.5 86.9  MCH 28.4 28.4  MCHC 31.7 32.7  RDW 14.7 14.2  PLT 452* 460*    BNP Recent Labs  Lab 09/22/19 2335  BNP 414.1*     DDimer No results for input(s): DDIMER in the last 168 hours.   Radiology    No results found.  Cardiac Studies   TEE/DCCV 09/26/2019: Impressions:  1. Left ventricular ejection fraction, by visual estimation, is 30 to 35%. The left ventricle has moderate to severely decreased function. There is no left ventricular hypertrophy.  2. Global right ventricle has mildly reduced  systolic function.The right ventricular size is normal. No increase in right ventricular wall thickness.  3. Left atrial size was normal.  4. No thrombi in the LA and LAA.  5. Right atrial size was normal.  6. The mitral valve is normal in structure. Mild to moderate mitral valve regurgitation.  7. The tricuspid valve is normal in structure. Tricuspid valve regurgitation moderate.  8. The aortic valve is normal in structure. Aortic valve regurgitation is trivial.  9. The pulmonic valve was normal in structure. Pulmonic valve regurgitation is trivial. 10. Moderately elevated pulmonary artery systolic pressure.  Patient Profile     53 y.o. male with a history of controlled schizophrenia, CKD stage III-IV who presented with a few weeks of shortness of breath and vague palpitations. He was found to be in atrial flutter with RVR and was found to have dilated cardiomyopathy.  Assessment & Plan    Atrial Flutter with RVR - Maintaining normal sinus rhythm after DCCV yesterday.  - Continue PO Amiodarone. Currently on 400mg  twice daily. Not ideal to be on amiodarone long term given his young age, but with reduced EF and his comorbidities, will use in the short term to maintain sinus rhythm. May need to discuss EP/ablation in the future if this persists. - Consider adding Toprol-XL given reduced EF. - CHA2DS2VASc = 1 (CHF). Continue Eliquis 5mg  twice daily for a least 4 weeks post-cardioversion.  Newly Diagnosed Acute Combined CHF - Echo at [facility] showed LVEF of approximately 25% with global hypokinesis as well as moderately reduced RV contraction with RVSP of 40 mmHg. On TEE yesterday, EF 30-35% with mild to moderate MR, moderate TR, and moderately elevated PASP. - Initially on IV Lasix but transitioned to PO lasix 40mg  daily starting this morning. Net negative 14.9 L this admission. No weights over the last 2 days. Creatinine continuing to rise. - Continue Isordil 10mg  every 8 hours.  - Consider  adding Toprol-XL 25mg  daily. - No ACEi/ARB/ARNI due to renal function. - Continue to monitor daily  weights, strict I/O's, and renal function during admission. - Will need outpatient follow up and discussion of ischemic evaluation soon vs after repeat Echo if sinus rhythm maintained.  Suspected Obstructive Sleep Apnea - Recommend outpatient sleep study.  CKD Stage III-IV - Creatinine continues to increase. 3.62 today, up from 3.37 yesterday and 3.09 on admission. - He has already received morning dose of Lasix. If continues to climb tomorrow, may need to hold diuretics for a day. - Continue to monitor closely.  - Patient has never seen Nephrology. Recommend outpatient referral.   For questions or updates, please contact [place] Please consult [email or web address] for contact info under        Signed, Darreld Mclean, PA-C  09/27/2019, 12:08 PM

## 2019-09-28 LAB — CBC WITH DIFFERENTIAL/PLATELET
Abs Immature Granulocytes: 0.02 10*3/uL (ref 0.00–0.07)
Basophils Absolute: 0.1 10*3/uL (ref 0.0–0.1)
Basophils Relative: 1 %
Eosinophils Absolute: 0.2 10*3/uL (ref 0.0–0.5)
Eosinophils Relative: 3 %
HCT: 37.5 % — ABNORMAL LOW (ref 39.0–52.0)
Hemoglobin: 12.2 g/dL — ABNORMAL LOW (ref 13.0–17.0)
Immature Granulocytes: 0 %
Lymphocytes Relative: 12 %
Lymphs Abs: 0.8 10*3/uL (ref 0.7–4.0)
MCH: 28.2 pg (ref 26.0–34.0)
MCHC: 32.5 g/dL (ref 30.0–36.0)
MCV: 86.8 fL (ref 80.0–100.0)
Monocytes Absolute: 0.7 10*3/uL (ref 0.1–1.0)
Monocytes Relative: 10 %
Neutro Abs: 5.4 10*3/uL (ref 1.7–7.7)
Neutrophils Relative %: 74 %
Platelets: 499 10*3/uL — ABNORMAL HIGH (ref 150–400)
RBC: 4.32 MIL/uL (ref 4.22–5.81)
RDW: 14.2 % (ref 11.5–15.5)
WBC: 7.2 10*3/uL (ref 4.0–10.5)
nRBC: 0 % (ref 0.0–0.2)

## 2019-09-28 LAB — RENAL FUNCTION PANEL
Albumin: 3.9 g/dL (ref 3.5–5.0)
Anion gap: 11 (ref 5–15)
BUN: 59 mg/dL — ABNORMAL HIGH (ref 6–20)
CO2: 26 mmol/L (ref 22–32)
Calcium: 9.3 mg/dL (ref 8.9–10.3)
Chloride: 98 mmol/L (ref 98–111)
Creatinine, Ser: 3.14 mg/dL — ABNORMAL HIGH (ref 0.61–1.24)
GFR calc Af Amer: 25 mL/min — ABNORMAL LOW (ref >60)
GFR calc non Af Amer: 22 mL/min — ABNORMAL LOW (ref >60)
Glucose, Bld: 145 mg/dL — ABNORMAL HIGH (ref 70–99)
Phosphorus: 3.5 mg/dL (ref 2.5–4.6)
Potassium: 3.8 mmol/L (ref 3.5–5.1)
Sodium: 135 mmol/L (ref 135–145)

## 2019-09-28 LAB — MAGNESIUM: Magnesium: 2.3 mg/dL (ref 1.7–2.4)

## 2019-09-28 MED ORDER — AMIODARONE HCL 200 MG PO TABS: ORAL_TABLET | ORAL | 0 refills | Status: DC

## 2019-09-28 MED ORDER — APIXABAN 5 MG PO TABS: 5.0000 mg | ORAL_TABLET | Freq: Two times a day (BID) | ORAL | 0 refills | Status: DC

## 2019-09-28 MED ORDER — ISOSORBIDE DINITRATE 10 MG PO TABS: 10.0000 mg | ORAL_TABLET | Freq: Three times a day (TID) | ORAL | 1 refills | Status: DC

## 2019-09-28 MED ORDER — FUROSEMIDE 40 MG PO TABS: 40.0000 mg | ORAL_TABLET | Freq: Every day | ORAL | 1 refills | Status: DC

## 2019-09-28 MED ORDER — POTASSIUM CHLORIDE CRYS ER 20 MEQ PO TBCR: 40.0000 meq | EXTENDED_RELEASE_TABLET | Freq: Every day | ORAL | 0 refills | Status: DC

## 2019-09-28 MED FILL — ELIQUIS 5 MG TABLET: 5 | 30 days supply | Qty: 60 | Fill #0

## 2019-09-28 MED FILL — ISOSORBIDE DN 20 MG TABLET: 20 | 30 days supply | Qty: 45 | Fill #0

## 2019-09-28 MED FILL — FUROSEMIDE 40 MG TABLET: 40 | 30 days supply | Qty: 30 | Fill #0

## 2019-09-28 MED FILL — AMIODARONE HCL 200 MG TAB: 200 | 42 days supply | Qty: 64 | Fill #0

## 2019-09-28 MED FILL — POTASSIUM CL ER 20 MEQ TABL: 20 | 14 days supply | Qty: 28 | Fill #0

## 2019-09-28 NOTE — Progress Notes (Signed)
Pt appeared blunted affect, alert, oriented x4 , followed commands, and answered questions appropriately. Vital signs stable, afebrile, normal sinus rhythm on monitor, HR 90s, BP 139/97 mmHg, SPO2 95-97 % on room air tonight.  Pt has had Foley cath from [facility] with good amount of urine output 2,450 ml in 24 hours. No immediate distress noted. We continue to monitor.  Kennyth Lose, RN

## 2019-09-28 NOTE — Discharge Summary (Signed)
. Physician Discharge Summary  SQUIRE WITHEY IPJ:[id number] DOB: [birth year] DOA: 09/22/2019  PCP: Patient, No Pcp Per  Admit date: 09/22/2019 Discharge date: 09/28/2019  Admitted From: Home Disposition:  Discharged to home w/ family.  Recommendations for Outpatient Follow-up:  1. Follow up with PCP in 1-2 weeks 2. Please obtain BMP/CBC in one week 3. Needs follow up with Nephrology.  Discharge Condition: Stable  CODE STATUS: FULL   Brief/Interim Summary: 53 year old gentleman with history of a schizophrenia, no known previous problems, poor historian presented to [facility] emergency room 2 days ago with about 2 weeks history of not feeling well and shortness of breath. No fever or chills. No airway symptoms. He was admitted to [facility] with rapid a flutter, treated with amiodarone, EF was 25%, unable to achieve rate control and transferred to [facility]. Patient does not know his baseline renal functions. He thinks he might have some kidney issues in the past. He says his last checkup with his doctor was 3 years ago. Patient not known to have congestive heart failure.  11/12: Scr is improved this morning. He is stable. Needs follow up with nephrology outpt; as well as cardiology. Ready for discharge. Updated sister by phone.   Discharge Diagnoses:  Principal Problem:   Atrial fibrillation/flutter ([place]) Active Problems:   CKD (chronic kidney disease), stage III   Schizophrenia (HCC)   Cardiomegaly   Pleural effusion   Lobar pneumonia (HCC)   Benign essential HTN   Atypical atrial flutter (HCC)  Atypical a flutter w/ RVR     - s/p TEE directed cardioversion on 09/26/2019     - per cards:          - Continue Eliquis 5mg  twice daily for a least 4 weeks post-cardioversion.         - Amiodarone load: 400 mg BID for one week, then 200 mg BID for one week, then 200 mg daily.         - Other recommendations (labs, testing, etc):  will need LFTs, TFTs, PFTs as outpatient if  planned for long term amiodarone use         - Follow up as an outpatient:  We will arrange for follow up with Dr. Bettina Gavia.  AKI on CKD 4     - Scr down to 3.14 today     - 16.2L down since admission     - now on lasix 40mg  daily; continue  Hypokalemia     - resolved     - continue daily KCl p.o. 40 mEq  Pyuria in the setting of indwelling Foley catheter     - U/A taken on 09/23/19 positive for pyuria     - Afebrile with no leukocytosis     - UCx negative; stop rocephin     - continue to follow w/ urology outpatient  Chronic systolic CHF     - TEE EF 30-35%     - Euvolemic on exam     - 14.7L down since admission     - lasix 40mg  qday, isordil 10mg  TID     Schizophrenia     - Lives at home with his sister.     - risperidone, Lexapro and trihexyphenidyl  RUL lung abnormalities on CT chest:      - Covid 19 negative     - His CT scan without contrast done on admission showed some right middle lobe abnormalities.      - No leukocytosis, cough or sputum production.     -  Was treated with antibiotics in the hospital at [facility].     - Procalcitonin level negative     - no further abx needed at this time  Suspected OSA      - based on body habitus, neck circumference and loud snores with possible daytime somnolence     - Recommend polysomnography outpatient  Discharge Instructions   Allergies as of 09/28/2019   No Known Allergies     Medication List    TAKE these medications   amiodarone 200 MG tablet Commonly known as: Pacerone Take 2 tablets (400 mg total) by mouth 2 (two) times daily for 5 days, THEN 1 tablet (200 mg total) 2 (two) times daily for 7 days, THEN 1 tablet (200 mg total) daily. Start taking on: September 28, 2019   apixaban 5 MG Tabs tablet Commonly known as: ELIQUIS Take 1 tablet (5 mg total) by mouth 2 (two) times daily.   escitalopram 10 MG tablet Commonly known as: LEXAPRO Take 10 mg by mouth every morning.   furosemide 40 MG  tablet Commonly known as: LASIX Take 1 tablet (40 mg total) by mouth daily. Start taking on: September 29, 2019   isosorbide dinitrate 10 MG tablet Commonly known as: ISORDIL Take 1 tablet (10 mg total) by mouth every 8 (eight) hours.   potassium chloride SA 20 MEQ tablet Commonly known as: KLOR-CON Take 2 tablets (40 mEq total) by mouth daily for 14 days. Start taking on: September 29, 2019   risperidone 4 MG tablet Commonly known as: RISPERDAL Take 4 mg by mouth at bedtime.   trihexyphenidyl 2 MG tablet Commonly known as: ARTANE Take 2 mg by mouth 2 (two) times daily.      Follow-up Information    Tobb, Kardie, DO Follow up.   Specialty: Cardiology Why: You have a hospital follow-up visit with Dr. Harriet Masson scheduled for 10/10/2019 at 2:35pm. If this date/time does not work for you, please call our office to reschedule.  Contact information: [place] [address] [phone]          No Known Allergies  Consultations:  Cardiology   Procedures/Studies:  No results found.   Subjective: "No. I'm ok. I live with her."  Discharge Exam: Vitals:   09/28/19 0434 09/28/19 0846  BP: (!) 139/97 (!) 141/97  Pulse: 90 87  Resp: 16 14  Temp: 98 F (36.7 C) 98.1 F (36.7 C)  SpO2: 95% 97%   Vitals:   09/27/19 2030 09/28/19 0000 09/28/19 0434 09/28/19 0846  BP: 133/90 (!) 134/92 (!) 139/97 (!) 141/97  Pulse: 97 95 90 87  Resp: 20 17 16 14   Temp: 97.6 F (36.4 C) 98.7 F (37.1 C) 98 F (36.7 C) 98.1 F (36.7 C)  TempSrc: Oral Oral Axillary Oral  SpO2: 97% 94% 95% 97%  Weight:      Height:       General: 53 y.o. male resting in bed in NAD Cardiovascular: RRR, +S1, S2, no m/g/r Respiratory: CTABL, no w/r/r GI: BS+, NDNT, soft MSK: No e/c/c Neuro: Alert to name, follows commands   The results of significant diagnostics from this hospitalization (including imaging, microbiology, ancillary and laboratory) are listed below for reference.      Microbiology: Recent Results (from the past 240 hour(s))  Culture, Urine     Status: None   Collection Time: 09/26/19 12:18 AM   Specimen: Urine, Clean Catch  Result Value Ref Range Status   Specimen Description URINE, CLEAN CATCH  Final   Special Requests NONE  Final   Culture   Final    NO GROWTH Performed at [facility], [place] [address], [place], [place] [address]    Report Status 09/27/2019 FINAL  Final     Labs: BNP (last 3 results) Recent Labs    09/22/19 2335  BNP 478.2*   Basic Metabolic Panel: Recent Labs  Lab 09/24/19 0301 09/25/19 0247 09/26/19 0225 09/27/19 0225 09/28/19 0824  NA 139 139 138 138 135  K 3.4* 3.4* 3.7 4.0 3.8  CL 97* 96* 96* 98 98  CO2 28 29 29 27 26   GLUCOSE 80 118* 106* 106* 145*  BUN 37* 36* 46* 57* 59*  CREATININE 2.88* 3.03* 3.37* 3.62* 3.14*  CALCIUM 8.7* 9.1 9.4 9.3 9.3  MG 1.8  --  2.1  --  2.3  PHOS 3.6  --   --   --  3.5   Liver Function Tests: Recent Labs  Lab 09/22/19 2313 09/24/19 0301 09/28/19 0824  AST 46* 33  --   ALT 46* 39  --   ALKPHOS 67 75  --   BILITOT 0.7 1.0  --   PROT 7.4 7.0  --   ALBUMIN 3.3* 3.2* 3.9   No results for input(s): LIPASE, AMYLASE in the last 168 hours. No results for input(s): AMMONIA in the last 168 hours. CBC: Recent Labs  Lab 09/23/19 0247 09/24/19 0301 09/28/19 0824  WBC 8.6 8.3 7.2  NEUTROABS  --  5.9 5.4  HGB 11.9* 12.8* 12.2*  HCT 37.5* 39.1 37.5*  MCV 89.5 86.9 86.8  PLT 452* 460* 499*   Cardiac Enzymes: No results for input(s): CKTOTAL, CKMB, CKMBINDEX, TROPONINI in the last 168 hours. BNP: Invalid input(s): POCBNP CBG: No results for input(s): GLUCAP in the last 168 hours. D-Dimer No results for input(s): DDIMER in the last 72 hours. Hgb A1c No results for input(s): HGBA1C in the last 72 hours. Lipid Profile No results for input(s): CHOL, HDL, LDLCALC, TRIG, CHOLHDL, LDLDIRECT in the last 72 hours. Thyroid function studies No results for input(s):  TSH, T4TOTAL, T3FREE, THYROIDAB in the last 72 hours.  Invalid input(s): FREET3 Anemia work up No results for input(s): VITAMINB12, FOLATE, FERRITIN, TIBC, IRON, RETICCTPCT in the last 72 hours. Urinalysis    Component Value Date/Time   COLORURINE RED (A) 09/23/2019 1827   APPEARANCEUR CLOUDY (A) 09/23/2019 1827   LABSPEC 1.025 09/23/2019 1827   PHURINE 5.5 09/23/2019 1827   GLUCOSEU NEGATIVE 09/23/2019 1827   HGBUR LARGE (A) 09/23/2019 1827   BILIRUBINUR SMALL (A) 09/23/2019 1827   KETONESUR NEGATIVE 09/23/2019 1827   PROTEINUR 30 (A) 09/23/2019 1827   NITRITE POSITIVE (A) 09/23/2019 1827   LEUKOCYTESUR TRACE (A) 09/23/2019 1827   Sepsis Labs Invalid input(s): PROCALCITONIN,  WBC,  LACTICIDVEN Microbiology Recent Results (from the past 240 hour(s))  Culture, Urine     Status: None   Collection Time: 09/26/19 12:18 AM   Specimen: Urine, Clean Catch  Result Value Ref Range Status   Specimen Description URINE, CLEAN CATCH  Final   Special Requests NONE  Final   Culture   Final    NO GROWTH Performed at [facility], [place] [address], [place], [place] [address]    Report Status 09/27/2019 FINAL  Final     Time coordinating discharge: 35 minutes  SIGNED:   Jonnie Finner, DO  [facility] 09/28/2019, 12:09 PM Pager   If 7PM-7AM, please contact night-coverage [email or web address] Password  [email or web address]

## 2019-09-28 NOTE — Progress Notes (Signed)
Discharge instructions given to patient. IVs removed, clean and intact. Medications reviewed. All questions answered. Patient escorted home with brother.   Arletta Bale, RN

## 2019-09-29 ENCOUNTER — Encounter (HOSPITAL_COMMUNITY): Payer: Self-pay | Admitting: Cardiovascular Disease

## 2019-10-10 ENCOUNTER — Ambulatory Visit: Payer: Medicare Other | Admitting: Cardiology

## 2019-10-23 ENCOUNTER — Other Ambulatory Visit: Payer: Self-pay

## 2019-10-23 ENCOUNTER — Encounter: Payer: Self-pay | Admitting: Cardiology

## 2019-10-23 ENCOUNTER — Ambulatory Visit (INDEPENDENT_AMBULATORY_CARE_PROVIDER_SITE_OTHER): Payer: Medicare Other | Admitting: Cardiology

## 2019-10-23 VITALS — BP 146/70 | HR 78 | Ht 67.0 in | Wt 230.8 lb

## 2019-10-23 DIAGNOSIS — I10 Essential (primary) hypertension: Secondary | ICD-10-CM

## 2019-10-23 DIAGNOSIS — I4892 Unspecified atrial flutter: Secondary | ICD-10-CM | POA: Diagnosis not present

## 2019-10-23 DIAGNOSIS — I420 Dilated cardiomyopathy: Secondary | ICD-10-CM

## 2019-10-23 DIAGNOSIS — F209 Schizophrenia, unspecified: Secondary | ICD-10-CM

## 2019-10-23 DIAGNOSIS — I509 Heart failure, unspecified: Secondary | ICD-10-CM

## 2019-10-23 DIAGNOSIS — I4891 Unspecified atrial fibrillation: Secondary | ICD-10-CM

## 2019-10-23 DIAGNOSIS — I429 Cardiomyopathy, unspecified: Secondary | ICD-10-CM

## 2019-10-23 DIAGNOSIS — IMO0002 Reserved for concepts with insufficient information to code with codable children: Secondary | ICD-10-CM

## 2019-10-23 HISTORY — DX: Dilated cardiomyopathy: I42.0

## 2019-10-23 HISTORY — DX: Heart failure, unspecified: I50.9

## 2019-10-23 HISTORY — DX: Cardiomyopathy, unspecified: I42.9

## 2019-10-23 NOTE — Patient Instructions (Signed)
Medication Instructions:  Your physician recommends that you continue on your current medications as directed. Please refer to the Current Medication list given to you today.  *If you need a refill on your cardiac medications before your next appointment, please call your pharmacy*  Lab Work: Your physician recommends that you return for lab work today: bmp   If you have labs (blood work) drawn today and your tests are completely normal, you will receive your results only by: [name] Kitchen MyChart Message (if you have MyChart) OR . A paper copy in the mail If you have any lab test that is abnormal or we need to change your treatment, we will call you to review the results.  Testing/Procedures: None.   Follow-Up: At [facility], you and your health needs are our priority.  As part of our continuing mission to provide you with exceptional heart care, we have created designated Provider Care Teams.  These Care Teams include your primary Cardiologist (physician) and Advanced Practice Providers (APPs -  Physician Assistants and Nurse Practitioners) who all work together to provide you with the care you need, when you need it.  Your next appointment:   1 month(s)  The format for your next appointment:   In Person  Provider:   Jenne Campus, MD  Other Instructions

## 2019-10-23 NOTE — Addendum Note (Signed)
Addended by: Ashok Norris on: 10/23/2019 09:49 AM   Modules accepted: Orders

## 2019-10-23 NOTE — Progress Notes (Signed)
Cardiology Office Note:    Date:  10/23/2019   ID:  Michael Mcknight, DOB Nov 08, 1966, MRN [id number]  PCP:  Ronita Hipps, MD  Cardiologist:  Jenne Campus, MD    Referring MD: No ref. provider found   Chief Complaint  Patient presents with  . Hospitalization Follow-up    History of Present Illness:    Michael Mcknight is a 53 y.o. male with past medical history significant for schizophrenia, hypertension.  Recently admitted to the hospital because of not feeling well.  He was found to be in atrial fibrillation with fast ventricular rate.  Eventually he was transferred to [facility], TEE has been done and she was converted to sinus rhythm anticoagulated since that time, also on amiodarone.  Seems to be doing well.  In the process of hospitalization he was also found to have significant cardiomyopathy with ejection fraction 25% and then improved to 35.  Appropriate medication has been started my understanding is that there was also a problem with the kidney function therefore he is not ARB and not ACE inhibitor.  Overall he seems to be doing well.  Denies having the chest pain, tightness, pressure, burning in the chest no palpitations.  No dizziness no passing out.  No past medical history on file.  Past Surgical History:  Procedure Laterality Date  . CARDIOVERSION N/A 09/26/2019   Procedure: CARDIOVERSION;  Surgeon: Acie Fredrickson Wonda Cheng, MD;  Location: [facility] ENDOSCOPY;  Service: Cardiovascular;  Laterality: N/A;  . TEE WITHOUT CARDIOVERSION N/A 09/26/2019   Procedure: TRANSESOPHAGEAL ECHOCARDIOGRAM (TEE);  Surgeon: Thayer Headings, MD;  Location: [facility] ENDOSCOPY;  Service: Cardiovascular;  Laterality: N/A;    Current Medications: Current Meds  Medication Sig  . amiodarone (PACERONE) 200 MG tablet Take 2 tablets (400 mg total) by mouth 2 (two) times daily for 5 days, THEN 1 tablet (200 mg total) 2 (two) times daily for 7 days, THEN 1 tablet (200 mg total) daily. (Patient taking differently:  Take 1 tablet daily)  . apixaban (ELIQUIS) 5 MG TABS tablet Take 1 tablet (5 mg total) by mouth 2 (two) times daily.  [name] Kitchen escitalopram (LEXAPRO) 10 MG tablet Take 10 mg by mouth every morning.  . furosemide (LASIX) 40 MG tablet Take 1 tablet (40 mg total) by mouth daily.  . isosorbide dinitrate (ISORDIL) 10 MG tablet Take 1 tablet (10 mg total) by mouth every 8 (eight) hours.  . potassium chloride SA (KLOR-CON) 20 MEQ tablet Take 2 tablets (40 mEq total) by mouth daily for 14 days.  . risperidone (RISPERDAL) 4 MG tablet Take 4 mg by mouth at bedtime.  . trihexyphenidyl (ARTANE) 2 MG tablet Take 2 mg by mouth 2 (two) times daily.     Allergies:   Patient has no known allergies.   Social History   Socioeconomic History  . Marital status: Married    Spouse name: Not on file  . Number of children: Not on file  . Years of education: Not on file  . Highest education level: Not on file  Occupational History  . Not on file  Social Needs  . Financial resource strain: Not on file  . Food insecurity    Worry: Not on file    Inability: Not on file  . Transportation needs    Medical: Not on file    Non-medical: Not on file  Tobacco Use  . Smoking status: Never Smoker  . Smokeless tobacco: Never Used  Substance and Sexual Activity  . Alcohol  use: Not on file  . Drug use: Not on file  . Sexual activity: Not on file  Lifestyle  . Physical activity    Days per week: Not on file    Minutes per session: Not on file  . Stress: Not on file  Relationships  . Social Herbalist on phone: Not on file    Gets together: Not on file    Attends religious service: Not on file    Active member of club or organization: Not on file    Attends meetings of clubs or organizations: Not on file    Relationship status: Not on file  Other Topics Concern  . Not on file  Social History Narrative  . Not on file     Family History: The patient's family history is not on file. ROS:   Please see  the history of present illness.    All 14 point review of systems negative except as described per history of present illness  EKGs/Labs/Other Studies Reviewed:      Recent Labs: 09/22/2019: B Natriuretic Peptide 414.1; TSH 1.463 09/24/2019: ALT 39 09/28/2019: BUN 59; Creatinine, Ser 3.14; Hemoglobin 12.2; Magnesium 2.3; Platelets 499; Potassium 3.8; Sodium 135  Recent Lipid Panel    Component Value Date/Time   CHOL 138 09/23/2019 0247   TRIG 84 09/23/2019 0247   HDL 35 (L) 09/23/2019 0247   CHOLHDL 3.9 09/23/2019 0247   VLDL 17 09/23/2019 0247   LDLCALC 86 09/23/2019 0247    Physical Exam:    VS:  BP (!) 146/70   Pulse 78   Ht 5\' 7"  (1.702 m)   Wt 230 lb 12.8 oz (104.7 kg)   SpO2 96%   BMI 36.15 kg/m     Wt Readings from Last 3 Encounters:  10/23/19 230 lb 12.8 oz (104.7 kg)  09/25/19 231 lb 0.7 oz (104.8 kg)     GEN:  Well nourished, well developed in no acute distress HEENT: Normal NECK: No JVD; No carotid bruits LYMPHATICS: No lymphadenopathy CARDIAC: RRR, no murmurs, no rubs, no gallops RESPIRATORY:  Clear to auscultation without rales, wheezing or rhonchi  ABDOMEN: Soft, non-tender, non-distended MUSCULOSKELETAL:  No edema; No deformity  SKIN: Warm and dry LOWER EXTREMITIES: no swelling NEUROLOGIC:  Alert and oriented x 3 PSYCHIATRIC:  Normal affect   ASSESSMENT:    1. Atrial fibrillation/flutter ([place])   2. Dilated cardiomyopathy ([place]) ejection fraction 35% in November 2020   3. Schizophrenia, unspecified type ([place])   4. Benign essential HTN   5. Congestive heart failure due to cardiomyopathy ([place]) New York Heart Association class II    PLAN:    In order of problems listed above:  1. Atrial fibrillation which is paroxysmal.  EKG will be done today to check the rhythm. 2. Dilated cardiomyopathy ejection fraction 35%.  Will check Chem-7 to see if we will can start him on ACE inhibitor or ARB.  He will require appropriate medical therapy for 3 months  and then recheck his left ventricle ejection fraction. 3. Schizophrenia this appears to be stable. 4. Essential hypertension blood pressure elevated today again will make decision regarding medication based on results of Chem-7. 5. Congestive heart failure seems to be compensated hemodynamically.  He is New York Heart Association class II.   Medication Adjustments/Labs and Tests Ordered: Current medicines are reviewed at length with the patient today.  Concerns regarding medicines are outlined above.  No orders of the defined types were placed in this  encounter.  Medication changes: No orders of the defined types were placed in this encounter.   Signed, Park Liter, MD, [facility] 10/23/2019 9:20 AM    [place]

## 2019-10-24 LAB — BASIC METABOLIC PANEL
BUN/Creatinine Ratio: 11 (ref 9–20)
BUN: 35 mg/dL — ABNORMAL HIGH (ref 6–24)
CO2: 25 mmol/L (ref 20–29)
Calcium: 9.3 mg/dL (ref 8.7–10.2)
Chloride: 98 mmol/L (ref 96–106)
Creatinine, Ser: 3.2 mg/dL — ABNORMAL HIGH (ref 0.76–1.27)
GFR calc Af Amer: 24 mL/min/{1.73_m2} — ABNORMAL LOW (ref >59)
GFR calc non Af Amer: 21 mL/min/{1.73_m2} — ABNORMAL LOW (ref >59)
Glucose: 96 mg/dL (ref 65–99)
Potassium: 4.8 mmol/L (ref 3.5–5.2)
Sodium: 141 mmol/L (ref 134–144)

## 2019-10-25 ENCOUNTER — Telehealth: Payer: Self-pay | Admitting: Cardiology

## 2019-10-25 NOTE — Telephone Encounter (Signed)
Please call Nira Conn regarding patient's meds [phone]

## 2019-10-26 NOTE — Telephone Encounter (Signed)
Called Nira Conn she reports she is his niece and needs to know what medications he is supposed to be on. There is no dpr on file. I advised her I can call him and get verbally permission she is going to reach out to him and let him know this is the plan and she will call back with his number.

## 2019-10-26 NOTE — Telephone Encounter (Signed)
Patient called back and gave verbal consent for me to talk to his niece Nira Conn and sister Juliann Pulse. They will call back to give info about medication.

## 2019-11-01 ENCOUNTER — Telehealth: Payer: Self-pay | Admitting: Cardiology

## 2019-11-01 NOTE — Telephone Encounter (Signed)
Call amiodarone, isosorbide, furosimide and Eliquis(just til the end of December for Eliquis) to Michael Mcknight

## 2019-11-02 MED ORDER — APIXABAN 5 MG PO TABS: 5.0000 mg | ORAL_TABLET | Freq: Two times a day (BID) | ORAL | 0 refills | Status: DC

## 2019-11-02 MED ORDER — FUROSEMIDE 40 MG PO TABS: 40.0000 mg | ORAL_TABLET | Freq: Every day | ORAL | 1 refills | Status: DC

## 2019-11-02 MED ORDER — AMIODARONE HCL 200 MG PO TABS: ORAL_TABLET | ORAL | 2 refills | Status: DC

## 2019-11-02 MED ORDER — ISOSORBIDE DINITRATE 10 MG PO TABS: 10.0000 mg | ORAL_TABLET | Freq: Three times a day (TID) | ORAL | 1 refills | Status: DC

## 2019-11-02 NOTE — Addendum Note (Signed)
Addended by: Ashok Norris on: 11/02/2019 09:28 AM   Modules accepted: Orders

## 2019-11-23 ENCOUNTER — Ambulatory Visit: Payer: Medicare Other | Admitting: Cardiology

## 2019-11-27 ENCOUNTER — Ambulatory Visit (INDEPENDENT_AMBULATORY_CARE_PROVIDER_SITE_OTHER): Payer: PPO | Admitting: Cardiology

## 2019-11-27 ENCOUNTER — Encounter: Payer: Self-pay | Admitting: Cardiology

## 2019-11-27 ENCOUNTER — Other Ambulatory Visit: Payer: Self-pay

## 2019-11-27 VITALS — BP 142/88 | HR 79 | Ht 67.0 in | Wt 227.0 lb

## 2019-11-27 DIAGNOSIS — F209 Schizophrenia, unspecified: Secondary | ICD-10-CM

## 2019-11-27 DIAGNOSIS — I429 Cardiomyopathy, unspecified: Secondary | ICD-10-CM | POA: Diagnosis not present

## 2019-11-27 DIAGNOSIS — I4891 Unspecified atrial fibrillation: Secondary | ICD-10-CM

## 2019-11-27 DIAGNOSIS — I10 Essential (primary) hypertension: Secondary | ICD-10-CM | POA: Diagnosis not present

## 2019-11-27 DIAGNOSIS — I509 Heart failure, unspecified: Secondary | ICD-10-CM | POA: Diagnosis not present

## 2019-11-27 DIAGNOSIS — I420 Dilated cardiomyopathy: Secondary | ICD-10-CM | POA: Diagnosis not present

## 2019-11-27 DIAGNOSIS — I4892 Unspecified atrial flutter: Secondary | ICD-10-CM

## 2019-11-27 DIAGNOSIS — IMO0002 Reserved for concepts with insufficient information to code with codable children: Secondary | ICD-10-CM

## 2019-11-27 MED ORDER — FUROSEMIDE 40 MG PO TABS: 40.0000 mg | ORAL_TABLET | Freq: Every day | ORAL | 1 refills | Status: DC

## 2019-11-27 MED ORDER — AMIODARONE HCL 200 MG PO TABS: ORAL_TABLET | ORAL | 1 refills | Status: DC

## 2019-11-27 MED ORDER — APIXABAN 5 MG PO TABS: 5.0000 mg | ORAL_TABLET | Freq: Two times a day (BID) | ORAL | 1 refills | Status: DC

## 2019-11-27 MED ORDER — ISOSORBIDE DINITRATE 10 MG PO TABS: 10.0000 mg | ORAL_TABLET | Freq: Three times a day (TID) | ORAL | 1 refills | Status: DC

## 2019-11-27 MED ORDER — HYDRALAZINE HCL 10 MG PO TABS: 10.0000 mg | ORAL_TABLET | Freq: Three times a day (TID) | ORAL | 1 refills | Status: DC

## 2019-11-27 NOTE — Progress Notes (Signed)
Cardiology Office Note:    Date:  11/27/2019   ID:  Michael Mcknight, DOB 06-Sep-1966, MRN [id number]  PCP:  Michael Hipps, MD  Cardiologist:  Michael Campus, MD    Referring MD: Michael Hipps, MD   No chief complaint on file. Doing well  History of Present Illness:    Michael Mcknight is a 54 y.o. male with paroxysmal atrial fibrillation, about a month ago he was admitted to the hospital because of palpitations he was fine to be in atrial fibrillation, TEE was done and then he was cardioverted electrically on top of that he was put on amiodarone.  Interestingly echocardiogram at that time showed ejection fraction 25 and then at the end of hospitalization ejection fraction improved to 35%.  He is not on an ARB ACE inhibitor or Entresto because of significant kidney dysfunction with creatinine in the neighborhood of 3.5.  Overall he is doing well he described to have some dizziness sometimes when he gets up very quickly but not to the point of falling down.  Denies have any chest pain, tightness, pressure, burning in the chest.  Overall seems to be doing well  History reviewed. No pertinent past medical history.  Past Surgical History:  Procedure Laterality Date  . CARDIOVERSION N/A 09/26/2019   Procedure: CARDIOVERSION;  Surgeon: Michael Fredrickson Wonda Cheng, MD;  Location: [facility] ENDOSCOPY;  Service: Cardiovascular;  Laterality: N/A;  . TEE WITHOUT CARDIOVERSION N/A 09/26/2019   Procedure: TRANSESOPHAGEAL ECHOCARDIOGRAM (TEE);  Surgeon: Michael Fredrickson Wonda Cheng, MD;  Location: [facility] ENDOSCOPY;  Service: Cardiovascular;  Laterality: N/A;    Current Medications: Current Meds  Medication Sig  . amiodarone (PACERONE) 200 MG tablet Take 1 tablet daily  . apixaban (ELIQUIS) 5 MG TABS tablet Take 1 tablet (5 mg total) by mouth 2 (two) times daily.  . furosemide (LASIX) 40 MG tablet Take 1 tablet (40 mg total) by mouth daily.  . isosorbide dinitrate (ISORDIL) 10 MG tablet Take 1 tablet (10 mg total) by mouth every  8 (eight) hours.     Allergies:   Patient has no known allergies.   Social History   Socioeconomic History  . Marital status: Married    Spouse name: Not on file  . Number of children: Not on file  . Years of education: Not on file  . Highest education level: Not on file  Occupational History  . Not on file  Tobacco Use  . Smoking status: Never Smoker  . Smokeless tobacco: Never Used  Substance and Sexual Activity  . Alcohol use: Not on file  . Drug use: Not on file  . Sexual activity: Not on file  Other Topics Concern  . Not on file  Social History Narrative  . Not on file   Social Determinants of Health   Financial Resource Strain:   . Difficulty of Paying Living Expenses: Not on file  Food Insecurity:   . Worried About Charity fundraiser in the Last Year: Not on file  . Ran Out of Food in the Last Year: Not on file  Transportation Needs:   . Lack of Transportation (Medical): Not on file  . Lack of Transportation (Non-Medical): Not on file  Physical Activity:   . Days of Exercise per Week: Not on file  . Minutes of Exercise per Session: Not on file  Stress:   . Feeling of Stress : Not on file  Social Connections:   . Frequency of Communication with Friends and Family: Not on  file  . Frequency of Social Gatherings with Friends and Family: Not on file  . Attends Religious Services: Not on file  . Active Member of Clubs or Organizations: Not on file  . Attends Archivist Meetings: Not on file  . Marital Status: Not on file     Family History: The patient's family history is not on file. ROS:   Please see the history of present illness.    All 14 point review of systems negative except as described per history of present illness  EKGs/Labs/Other Studies Reviewed:      Recent Labs: 09/22/2019: B Natriuretic Peptide 414.1; TSH 1.463 09/24/2019: ALT 39 09/28/2019: Hemoglobin 12.2; Magnesium 2.3; Platelets 499 10/23/2019: BUN 35; Creatinine, Ser  3.20; Potassium 4.8; Sodium 141  Recent Lipid Panel    Component Value Date/Time   CHOL 138 09/23/2019 0247   TRIG 84 09/23/2019 0247   HDL 35 (L) 09/23/2019 0247   CHOLHDL 3.9 09/23/2019 0247   VLDL 17 09/23/2019 0247   LDLCALC 86 09/23/2019 0247    Physical Exam:    VS:  BP (!) 142/88   Pulse 79   Ht 5\' 7"  (1.702 m)   Wt 227 lb (103 kg)   SpO2 97%   BMI 35.55 kg/m     Wt Readings from Last 3 Encounters:  11/27/19 227 lb (103 kg)  10/23/19 230 lb 12.8 oz (104.7 kg)  09/25/19 231 lb 0.7 oz (104.8 kg)     GEN:  Well nourished, well developed in no acute distress HEENT: Normal NECK: No JVD; No carotid bruits LYMPHATICS: No lymphadenopathy CARDIAC: RRR, no murmurs, no rubs, no gallops RESPIRATORY:  Clear to auscultation without rales, wheezing or rhonchi  ABDOMEN: Soft, non-tender, non-distended MUSCULOSKELETAL:  No edema; No deformity  SKIN: Warm and dry LOWER EXTREMITIES: no swelling NEUROLOGIC:  Alert and oriented x 3 PSYCHIATRIC:  Normal affect   ASSESSMENT:    1. Dilated cardiomyopathy (HCC) ejection fraction 35% in November 2020   2. Congestive heart failure due to cardiomyopathy [facility]) New York Heart Association class II   3. Benign essential HTN   4. Atrial fibrillation/flutter ([place])   5. Schizophrenia, unspecified type ([place])    PLAN:    In order of problems listed above:  1. Dilated cardiomyopathy.  I will add hydralazine 10 mg 3 times daily to his medical regimen, he is already on isosorbide, will continue with rest of his medications.  Next step will be to to beta-blocker. 2. Paroxysmal atrial fibrillation seems to be maintaining sinus rhythm with amiodarone 200 mg daily which I will continue for now.  He is anticoagulated with Eliquis however cannot afford his medication he wants to be switched to Coumadin.  He got about 2 months left of Eliquis and then after that we will switch him to Coumadin I will enroll him in our Coumadin clinic 3. Essential  hypertension still elevated.  Hopefully addition of hydralazine will help 4. Schizophrenia stable 5. Overall seems to be doing well.  I will continue present management.  In the future we will add beta-blocker today we are adding hydralazine to create combo of medication but hopefully improve his left ventricle ejection fraction.   Medication Adjustments/Labs and Tests Ordered: Current medicines are reviewed at length with the patient today.  Concerns regarding medicines are outlined above.  No orders of the defined types were placed in this encounter.  Medication changes: No orders of the defined types were placed in this encounter.   Signed,  Park Liter, MD, [facility] 11/27/2019 9:51 AM    [place]

## 2019-11-27 NOTE — Addendum Note (Signed)
Addended by: Particia Nearing B on: 11/27/2019 10:03 AM   Modules accepted: Orders

## 2019-11-27 NOTE — Patient Instructions (Signed)
Medication Instructions:  Your physician has recommended you make the following change in your medication:   START: Hydralazine 10 mg Take 1 tab three time daily  *If you need a refill on your cardiac medications before your next appointment, please call your pharmacy*  Lab Work: None If you have labs (blood work) drawn today and your tests are completely normal, you will receive your results only by: [name] Kitchen MyChart Message (if you have MyChart) OR . A paper copy in the mail If you have any lab test that is abnormal or we need to change your treatment, we will call you to review the results.  Testing/Procedures: None  Follow-Up: At [facility], you and your health needs are our priority.  As part of our continuing mission to provide you with exceptional heart care, we have created designated Provider Care Teams.  These Care Teams include your primary Cardiologist (physician) and Advanced Practice Providers (APPs -  Physician Assistants and Nurse Practitioners) who all work together to provide you with the care you need, when you need it.  Your next appointment:   1 month(s)  The format for your next appointment:   In Person  Provider:   Jenne Campus, MD  Other Instructions Hydralazine tablets What is this medicine? HYDRALAZINE (hye DRAL a zeen) is a type of vasodilator. It relaxes blood vessels, increasing the blood and oxygen supply to your heart. This medicine is used to treat high blood pressure. This medicine may be used for other purposes; ask your health care provider or pharmacist if you have questions. COMMON BRAND NAME(S): Apresoline What should I tell my health care provider before I take this medicine? They need to know if you have any of these conditions:  blood vessel disease  heart disease including angina or history of heart attack  kidney or liver disease  systemic lupus erythematosus (SLE)  an unusual or allergic reaction to hydralazine, tartrazine  dye, other medicines, foods, dyes, or preservatives  pregnant or trying to get pregnant  breast-feeding How should I use this medicine? Take this medicine by mouth with a glass of water. Follow the directions on the prescription label. Take your doses at regular intervals. Do not take your medicine more often than directed. Do not stop taking except on the advice of your doctor or health care professional. Talk to your pediatrician regarding the use of this medicine in children. Special care may be needed. While this drug may be prescribed for children for selected conditions, precautions do apply. Overdosage: If you think you have taken too much of this medicine contact a poison control center or emergency room at once. NOTE: This medicine is only for you. Do not share this medicine with others. What if I miss a dose? If you miss a dose, take it as soon as you can. If it is almost time for your next dose, take only that dose. Do not take double or extra doses. What may interact with this medicine?  medicines for high blood pressure  medicines for mental depression This list may not describe all possible interactions. Give your health care provider a list of all the medicines, herbs, non-prescription drugs, or dietary supplements you use. Also tell them if you smoke, drink alcohol, or use illegal drugs. Some items may interact with your medicine. What should I watch for while using this medicine? Visit your doctor or health care professional for regular checks on your progress. Check your blood pressure and pulse rate regularly. Ask your  doctor or health care professional what your blood pressure and pulse rate should be and when you should contact him or her. You may get drowsy or dizzy. Do not drive, use machinery, or do anything that needs mental alertness until you know how this medicine affects you. Do not stand or sit up quickly, especially if you are an older patient. This reduces the risk  of dizzy or fainting spells. Alcohol may interfere with the effect of this medicine. Avoid alcoholic drinks. Do not treat yourself for coughs, colds, or pain while you are taking this medicine without asking your doctor or health care professional for advice. Some ingredients may increase your blood pressure. What side effects may I notice from receiving this medicine? Side effects that you should report to your doctor or health care professional as soon as possible:  chest pain, or fast or irregular heartbeat  fever, chills, or sore throat  numbness or tingling in the hands or feet  shortness of breath  skin rash, redness, blisters or itching  stiff or swollen joints  sudden weight gain  swelling of the feet or legs  swollen lymph glands  unusual weakness Side effects that usually do not require medical attention (report to your doctor or health care professional if they continue or are bothersome):  diarrhea, or constipation  headache  loss of appetite  nausea, vomiting This list may not describe all possible side effects. Call your doctor for medical advice about side effects. You may report side effects to FDA at 1-800-FDA-1088. Where should I keep my medicine? Keep out of the reach of children. Store at room temperature between 15 and 30 degrees C (59 and 86 degrees F). Throw away any unused medicine after the expiration date. NOTE: This sheet is a summary. It may not cover all possible information. If you have questions about this medicine, talk to your doctor, pharmacist, or health care provider.  2020 Elsevier/Gold Standard (2008-03-16 15:44:58)

## 2019-12-12 ENCOUNTER — Telehealth: Payer: Self-pay | Admitting: Cardiology

## 2019-12-12 NOTE — Telephone Encounter (Signed)
Left message for patient to return call.

## 2019-12-12 NOTE — Telephone Encounter (Signed)
Patient's niece Nira Conn calling stating they were told to call when the patient has 2 weeks left on eliquis. She states Dr. Agustin Cree wanted to start him on warfarin or coumadin. She says if she cannot be reached to call the patient's sister number who lives with him at [phone].

## 2019-12-12 NOTE — Telephone Encounter (Signed)
If issue is still ongoing what I mean by that if she cannot afford Eliquis when he runs out of Eliquis we need to start giving him Coumadin 2.5 mg daily he need to enroll in our Coumadin clinic we need to keep his INR between 1 and 2

## 2019-12-14 NOTE — Telephone Encounter (Signed)
Attempted to contact again. No answer and no voicemail this time.

## 2019-12-20 DIAGNOSIS — F209 Schizophrenia, unspecified: Secondary | ICD-10-CM | POA: Diagnosis not present

## 2019-12-20 NOTE — Telephone Encounter (Signed)
Left message for patient to return call.

## 2019-12-25 ENCOUNTER — Ambulatory Visit: Payer: PPO | Admitting: Cardiology

## 2020-01-02 NOTE — Telephone Encounter (Signed)
Left message for patient to return call.

## 2020-01-08 NOTE — Telephone Encounter (Signed)
Left message for patient to return call.

## 2020-01-15 NOTE — Telephone Encounter (Signed)
Left message for patient to return call. Due to multiple failed attempts will remove from work que.

## 2020-01-23 DIAGNOSIS — F209 Schizophrenia, unspecified: Secondary | ICD-10-CM | POA: Diagnosis not present

## 2020-01-23 DIAGNOSIS — Z79899 Other long term (current) drug therapy: Secondary | ICD-10-CM | POA: Diagnosis not present

## 2020-01-23 DIAGNOSIS — Z6837 Body mass index (BMI) 37.0-37.9, adult: Secondary | ICD-10-CM | POA: Diagnosis not present

## 2020-01-23 DIAGNOSIS — E669 Obesity, unspecified: Secondary | ICD-10-CM | POA: Diagnosis not present

## 2020-01-23 DIAGNOSIS — E782 Mixed hyperlipidemia: Secondary | ICD-10-CM | POA: Diagnosis not present

## 2020-01-23 DIAGNOSIS — D638 Anemia in other chronic diseases classified elsewhere: Secondary | ICD-10-CM | POA: Diagnosis not present

## 2020-01-23 DIAGNOSIS — I4891 Unspecified atrial fibrillation: Secondary | ICD-10-CM | POA: Diagnosis not present

## 2020-01-23 DIAGNOSIS — Z1331 Encounter for screening for depression: Secondary | ICD-10-CM | POA: Diagnosis not present

## 2020-01-23 DIAGNOSIS — D519 Vitamin B12 deficiency anemia, unspecified: Secondary | ICD-10-CM | POA: Diagnosis not present

## 2020-01-23 DIAGNOSIS — N183 Chronic kidney disease, stage 3 unspecified: Secondary | ICD-10-CM | POA: Diagnosis not present

## 2020-01-25 ENCOUNTER — Ambulatory Visit: Payer: PPO | Admitting: Cardiology

## 2020-01-25 ENCOUNTER — Other Ambulatory Visit: Payer: Self-pay

## 2020-01-25 ENCOUNTER — Encounter: Payer: Self-pay | Admitting: Cardiology

## 2020-01-25 ENCOUNTER — Encounter (INDEPENDENT_AMBULATORY_CARE_PROVIDER_SITE_OTHER): Payer: Self-pay

## 2020-01-25 VITALS — BP 138/70 | HR 72 | Ht 67.0 in | Wt 241.4 lb

## 2020-01-25 DIAGNOSIS — I509 Heart failure, unspecified: Secondary | ICD-10-CM | POA: Diagnosis not present

## 2020-01-25 DIAGNOSIS — IMO0002 Reserved for concepts with insufficient information to code with codable children: Secondary | ICD-10-CM

## 2020-01-25 DIAGNOSIS — I4891 Unspecified atrial fibrillation: Secondary | ICD-10-CM | POA: Diagnosis not present

## 2020-01-25 DIAGNOSIS — I10 Essential (primary) hypertension: Secondary | ICD-10-CM

## 2020-01-25 DIAGNOSIS — I4892 Unspecified atrial flutter: Secondary | ICD-10-CM

## 2020-01-25 DIAGNOSIS — I420 Dilated cardiomyopathy: Secondary | ICD-10-CM

## 2020-01-25 DIAGNOSIS — I429 Cardiomyopathy, unspecified: Secondary | ICD-10-CM

## 2020-01-25 MED ORDER — HYDRALAZINE HCL 25 MG PO TABS: 25.0000 mg | ORAL_TABLET | Freq: Three times a day (TID) | ORAL | 2 refills | Status: DC

## 2020-01-25 NOTE — Patient Instructions (Signed)
Medication Instructions:  Your physician has recommended you make the following change in your medication:   INCREASE: Hydralazine 25 mg every 8 hours.   IF you do ok with the increase in hydralazine after a week you can INCREASE: isosorbide dinitrate 20 mg 3 times daily.   *If you need a refill on your cardiac medications before your next appointment, please call your pharmacy*   Lab Work: None.  If you have labs (blood work) drawn today and your tests are completely normal, you will receive your results only by: [name] Kitchen MyChart Message (if you have MyChart) OR . A paper copy in the mail If you have any lab test that is abnormal or we need to change your treatment, we will call you to review the results.   Testing/Procedures: None.    Follow-Up: At [facility], you and your health needs are our priority.  As part of our continuing mission to provide you with exceptional heart care, we have created designated Provider Care Teams.  These Care Teams include your primary Cardiologist (physician) and Advanced Practice Providers (APPs -  Physician Assistants and Nurse Practitioners) who all work together to provide you with the care you need, when you need it.  We recommend signing up for the patient portal called "MyChart".  Sign up information is provided on this After Visit Summary.  MyChart is used to connect with patients for Virtual Visits (Telemedicine).  Patients are able to view lab/test results, encounter notes, upcoming appointments, etc.  Non-urgent messages can be sent to your provider as well.   To learn more about what you can do with MyChart, go to [email or web address].    Your next appointment:   1 month(s)  The format for your next appointment:   In Person  Provider:   Jenne Campus, MD   Other Instructions

## 2020-01-25 NOTE — Progress Notes (Signed)
Cardiology Office Note:    Date:  01/25/2020   ID:  Michael Mcknight, DOB [birth year], MRN [id number]  PCP:  Ronita Hipps, MD  Cardiologist:  Jenne Campus, MD    Referring MD: Ronita Hipps, MD   No chief complaint on file. Doing well  History of Present Illness:    Michael Mcknight is a 54 y.o. male with paroxysmal atrial fibrillatio, at the end of last year he came to hospital he was found to be in atrial fibrillation, TEE was done and he was cardioverted to sinus rhythm at that time his ejection fraction was 25% done later during the hospitalization echocardiogram has been repeated showed improvement in ejection fraction up to 35%.  He also does have schizophrenia as well as kidney dysfunction with creatinine usually in the neighborhood of 3.5.  He comes today to monitor follow-up.  Overall he is doing very well he does have schizophrenia so sometimes is difficult to get good story from him but I also have his knees on the phone and she confirmed that he is doing well.  Denies having any chest pain tightness squeezing pressure burning chest no palpitations.  No swelling of lower extremities.  The purpose of the visit is gradually continuing right medications.  His blood pressure still a little on the higher side.  History reviewed. No pertinent past medical history.  Past Surgical History:  Procedure Laterality Date  . CARDIOVERSION N/A 09/26/2019   Procedure: CARDIOVERSION;  Surgeon: Acie Fredrickson Wonda Cheng, MD;  Location: [facility] ENDOSCOPY;  Service: Cardiovascular;  Laterality: N/A;  . TEE WITHOUT CARDIOVERSION N/A 09/26/2019   Procedure: TRANSESOPHAGEAL ECHOCARDIOGRAM (TEE);  Surgeon: Acie Fredrickson Wonda Cheng, MD;  Location: [facility] ENDOSCOPY;  Service: Cardiovascular;  Laterality: N/A;    Current Medications: Current Meds  Medication Sig  . amiodarone (PACERONE) 200 MG tablet Take 1 tablet daily  . furosemide (LASIX) 40 MG tablet Take 1 tablet (40 mg total) by mouth daily.  . hydrALAZINE  (APRESOLINE) 10 MG tablet Take 1 tablet (10 mg total) by mouth 3 (three) times daily.  . isosorbide dinitrate (ISORDIL) 10 MG tablet Take 1 tablet (10 mg total) by mouth every 8 (eight) hours.     Allergies:   Patient has no known allergies.   Social History   Socioeconomic History  . Marital status: Married    Spouse name: Not on file  . Number of children: Not on file  . Years of education: Not on file  . Highest education level: Not on file  Occupational History  . Not on file  Tobacco Use  . Smoking status: Never Smoker  . Smokeless tobacco: Never Used  Substance and Sexual Activity  . Alcohol use: Not on file  . Drug use: Not on file  . Sexual activity: Not on file  Other Topics Concern  . Not on file  Social History Narrative  . Not on file   Social Determinants of Health   Financial Resource Strain:   . Difficulty of Paying Living Expenses:   Food Insecurity:   . Worried About Charity fundraiser in the Last Year:   . Arboriculturist in the Last Year:   Transportation Needs:   . Film/video editor (Medical):   [name] Kitchen Lack of Transportation (Non-Medical):   Physical Activity:   . Days of Exercise per Week:   . Minutes of Exercise per Session:   Stress:   . Feeling of Stress :   Social Connections:   .  Frequency of Communication with Friends and Family:   . Frequency of Social Gatherings with Friends and Family:   . Attends Religious Services:   . Active Member of Clubs or Organizations:   . Attends Archivist Meetings:   [name] Kitchen Marital Status:      Family History: The patient's family history is not on file. ROS:   Please see the history of present illness.    All 14 point review of systems negative except as described per history of present illness  EKGs/Labs/Other Studies Reviewed:      Recent Labs: 09/22/2019: B Natriuretic Peptide 414.1; TSH 1.463 09/24/2019: ALT 39 09/28/2019: Hemoglobin 12.2; Magnesium 2.3; Platelets 499 10/23/2019: BUN  35; Creatinine, Ser 3.20; Potassium 4.8; Sodium 141  Recent Lipid Panel    Component Value Date/Time   CHOL 138 09/23/2019 0247   TRIG 84 09/23/2019 0247   HDL 35 (L) 09/23/2019 0247   CHOLHDL 3.9 09/23/2019 0247   VLDL 17 09/23/2019 0247   LDLCALC 86 09/23/2019 0247    Physical Exam:    VS:  BP 138/70   Pulse 72   Ht 5\' 7"  (1.702 m)   Wt 241 lb 6.4 oz (109.5 kg)   SpO2 99%   BMI 37.81 kg/m     Wt Readings from Last 3 Encounters:  01/25/20 241 lb 6.4 oz (109.5 kg)  11/27/19 227 lb (103 kg)  10/23/19 230 lb 12.8 oz (104.7 kg)     GEN:  Well nourished, well developed in no acute distress HEENT: Normal NECK: No JVD; No carotid bruits LYMPHATICS: No lymphadenopathy CARDIAC: RRR, no murmurs, no rubs, no gallops RESPIRATORY:  Clear to auscultation without rales, wheezing or rhonchi  ABDOMEN: Soft, non-tender, non-distended MUSCULOSKELETAL:  No edema; No deformity  SKIN: Warm and dry LOWER EXTREMITIES: no swelling NEUROLOGIC:  Alert and oriented x 3 PSYCHIATRIC:  Normal affect   ASSESSMENT:    1. Atrial fibrillation/flutter ([place])   2. Dilated cardiomyopathy ([place]) ejection fraction 35% in November 2020   3. Congestive heart failure due to cardiomyopathy [facility]) New [place] Heart Association class II   4. Benign essential HTN    PLAN:    In order of problems listed above:  1. Atrial flutter/fibrillation, paroxysmal, seems to maintain sinus rhythm.  He is anticoagulated which I will continue.  We will continue all his medications. 2. Cardiomyopathy with ejection fraction of 35%.  I will increase dose of Apresoline to 25.3 times daily.  And also if he does okay about a week we will also doubling dose of isosorbide dinitrate to 20 mg every 8 hours.  The goal is to put him on appropriate medical therapy that will not affect his kidneys and a repeat echocardiogram to recheck left ventricle ejection fraction. 3. Congestive heart failure New [place] Heart Association class II.  Appears  to be compensated at the moment. 4. Benign essential hypertension blood pressure still a little on the higher side hopefully with higher dose of medication will be better. 5. Chronic kidney failure followed by internal medicine team.   Medication Adjustments/Labs and Tests Ordered: Current medicines are reviewed at length with the patient today.  Concerns regarding medicines are outlined above.  No orders of the defined types were placed in this encounter.  Medication changes: No orders of the defined types were placed in this encounter.   Signed, Park Liter, MD, [facility] 01/25/2020 8:37 AM    [place]

## 2020-01-25 NOTE — Addendum Note (Signed)
Addended by: Linna Hoff R on: 01/25/2020 09:00 AM   Modules accepted: Orders

## 2020-02-21 DIAGNOSIS — F209 Schizophrenia, unspecified: Secondary | ICD-10-CM | POA: Diagnosis not present

## 2020-02-22 ENCOUNTER — Encounter: Payer: Self-pay | Admitting: Cardiology

## 2020-02-22 ENCOUNTER — Other Ambulatory Visit: Payer: Self-pay

## 2020-02-22 ENCOUNTER — Ambulatory Visit: Payer: PPO | Admitting: Cardiology

## 2020-02-22 VITALS — BP 126/74 | HR 86 | Temp 97.3°F | Ht 67.0 in | Wt 244.8 lb

## 2020-02-22 DIAGNOSIS — I429 Cardiomyopathy, unspecified: Secondary | ICD-10-CM | POA: Diagnosis not present

## 2020-02-22 DIAGNOSIS — N1832 Chronic kidney disease, stage 3b: Secondary | ICD-10-CM | POA: Diagnosis not present

## 2020-02-22 DIAGNOSIS — I4891 Unspecified atrial fibrillation: Secondary | ICD-10-CM

## 2020-02-22 DIAGNOSIS — F209 Schizophrenia, unspecified: Secondary | ICD-10-CM | POA: Diagnosis not present

## 2020-02-22 DIAGNOSIS — I509 Heart failure, unspecified: Secondary | ICD-10-CM | POA: Diagnosis not present

## 2020-02-22 DIAGNOSIS — I4892 Unspecified atrial flutter: Secondary | ICD-10-CM | POA: Diagnosis not present

## 2020-02-22 DIAGNOSIS — IMO0002 Reserved for concepts with insufficient information to code with codable children: Secondary | ICD-10-CM

## 2020-02-22 DIAGNOSIS — I10 Essential (primary) hypertension: Secondary | ICD-10-CM

## 2020-02-22 DIAGNOSIS — I420 Dilated cardiomyopathy: Secondary | ICD-10-CM

## 2020-02-22 MED ORDER — HYDRALAZINE HCL 50 MG PO TABS: 50.0000 mg | ORAL_TABLET | Freq: Three times a day (TID) | ORAL | 1 refills | Status: DC

## 2020-02-22 NOTE — Progress Notes (Signed)
Cardiology Office Note:    Date:  02/22/2020   ID:  [name], DOB [birth year], MRN [id number]  PCP:  Ronita Hipps, MD  Cardiologist:  Jenne Campus, MD    Referring MD: Ronita Hipps, MD   No chief complaint on file. Doing well  History of Present Illness:    Michael Mcknight is a 54 y.o. male with past medical history significant for cardiomyopathy ejection fraction 35% in November 2020, also paroxysmal atrial fibrillation, status post cardioversion, now on amiodarone as well as Eliquis.  Comes today, for follow-up.  Overall doing well.  Denies have any shortness of breath, no swelling of lower extremities, no proximal nocturnal dyspnea.  Describes some dizziness sometimes when he gets up very quickly but overall seems to be doing well.  Past Medical History:  Diagnosis Date  . Atrial fibrillation/flutter ([place]) 09/22/2019  . Atypical atrial flutter ([place])   . Benign essential HTN 09/22/2019  . Cardiomegaly 09/22/2019  . CKD (chronic kidney disease), stage III 09/22/2019  . Congestive heart failure due to cardiomyopathy [facility]) New [place] Heart Association class II 10/23/2019  . Dilated cardiomyopathy ([place]) ejection fraction 35% in November 2020 10/23/2019  . Lobar pneumonia ([place]) 09/22/2019  . Pleural effusion 09/22/2019  . Schizophrenia ([place]) 09/22/2019    Past Surgical History:  Procedure Laterality Date  . CARDIOVERSION N/A 09/26/2019   Procedure: CARDIOVERSION;  Surgeon: Acie Fredrickson Wonda Cheng, MD;  Location: [facility] ENDOSCOPY;  Service: Cardiovascular;  Laterality: N/A;  . TEE WITHOUT CARDIOVERSION N/A 09/26/2019   Procedure: TRANSESOPHAGEAL ECHOCARDIOGRAM (TEE);  Surgeon: Acie Fredrickson Wonda Cheng, MD;  Location[facility] ENDOSCOPY;  Service: Cardiovascular;  Laterality: N/A;    Current Medications: Current Meds  Medication Sig  . amiodarone (PACERONE) 200 MG tablet Take 1 tablet daily  . escitalopram (LEXAPRO) 10 MG tablet Take 10 mg by mouth daily.  . hydrALAZINE (APRESOLINE) 25 MG tablet  Take 1 tablet (25 mg total) by mouth every 8 (eight) hours.  . risperidone (RISPERDAL) 4 MG tablet Take 4 mg by mouth 2 (two) times daily.  . trihexyphenidyl (ARTANE) 2 MG tablet Take 2 mg by mouth 2 (two) times daily.     Allergies:   Codeine   Social History   Socioeconomic History  . Marital status: Married    Spouse name: Not on file  . Number of children: Not on file  . Years of education: Not on file  . Highest education level: Not on file  Occupational History  . Not on file  Tobacco Use  . Smoking status: Never Smoker  . Smokeless tobacco: Never Used  Substance and Sexual Activity  . Alcohol use: Not on file  . Drug use: Not on file  . Sexual activity: Not on file  Other Topics Concern  . Not on file  Social History Narrative  . Not on file   Social Determinants of Health   Financial Resource Strain:   . Difficulty of Paying Living Expenses:   Food Insecurity:   . Worried About Charity fundraiser in the Last Year:   . Arboriculturist in the Last Year:   Transportation Needs:   . Film/video editor (Medical):   [name] Kitchen Lack of Transportation (Non-Medical):   Physical Activity:   . Days of Exercise per Week:   . Minutes of Exercise per Session:   Stress:   . Feeling of Stress :   Social Connections:   . Frequency of Communication with Friends and Family:   .  Frequency of Social Gatherings with Friends and Family:   . Attends Religious Services:   . Active Member of Clubs or Organizations:   . Attends Archivist Meetings:   [name] Kitchen Marital Status:      Family History: The patient's family history is not on file. ROS:   Please see the history of present illness.    All 14 point review of systems negative except as described per history of present illness  EKGs/Labs/Other Studies Reviewed:      Recent Labs: 09/22/2019: B Natriuretic Peptide 414.1; TSH 1.463 09/24/2019: ALT 39 09/28/2019: Hemoglobin 12.2; Magnesium 2.3; Platelets 499 10/23/2019:  BUN 35; Creatinine, Ser 3.20; Potassium 4.8; Sodium 141  Recent Lipid Panel    Component Value Date/Time   CHOL 138 09/23/2019 0247   TRIG 84 09/23/2019 0247   HDL 35 (L) 09/23/2019 0247   CHOLHDL 3.9 09/23/2019 0247   VLDL 17 09/23/2019 0247   LDLCALC 86 09/23/2019 0247    Physical Exam:    VS:  BP 126/74   Pulse 86   Temp (!) 97.3 F (36.3 C)   Ht 5\' 7"  (1.702 m)   Wt 244 lb 12.8 oz (111 kg)   SpO2 98%   BMI 38.34 kg/m     Wt Readings from Last 3 Encounters:  02/22/20 244 lb 12.8 oz (111 kg)  01/25/20 241 lb 6.4 oz (109.5 kg)  11/27/19 227 lb (103 kg)     GEN:  Well nourished, well developed in no acute distress HEENT: Normal NECK: No JVD; No carotid bruits LYMPHATICS: No lymphadenopathy CARDIAC: RRR, no murmurs, no rubs, no gallops RESPIRATORY:  Clear to auscultation without rales, wheezing or rhonchi  ABDOMEN: Soft, non-tender, non-distended MUSCULOSKELETAL:  No edema; No deformity  SKIN: Warm and dry LOWER EXTREMITIES: no swelling NEUROLOGIC:  Alert and oriented x 3 PSYCHIATRIC:  Normal affect   ASSESSMENT:    1. Congestive heart failure due to cardiomyopathy [facility]) New [place] Heart Association class II   2. Dilated cardiomyopathy ([place]) ejection fraction 35% in November 2020   3. Benign essential HTN   4. Atrial fibrillation/flutter ([facility])   5. Stage 3b chronic kidney disease   6. Schizophrenia, unspecified type ([place])    PLAN:    In order of problems listed above:  1. Congestive heart failure.  New [place] Heart Association class II.  Now stable.  Try to continue on the right medications.  He is already on 10 mg of isosorbide 3 times a day, today I will increase his Apresoline from 25 mg every 8 hours to 50 mg every 8 hours.  The biggest issue is the fact that he does have a kidney dysfunction with a creatinine never looked fine.  Therefore I cannot put him on ACE inhibitor, ARB, Entresto.  He also can benefit from beta-blocker, this will be next step and Dr. I  will do when I see him following appointment.  At that time we will most likely cut down his amiodarone and put him on beta-blocker. 2. Benign essential hypertension blood pressure well controlled continue present management. 3. Paroxysmal atrial fibrillation maintaining sinus rhythm, on amiodarone and Eliquis I will continue. 4. She is a primary.  Noted   Medication Adjustments/Labs and Tests Ordered: Current medicines are reviewed at length with the patient today.  Concerns regarding medicines are outlined above.  No orders of the defined types were placed in this encounter.  Medication changes: No orders of the defined types were placed in this encounter.  Signed, Park Liter, MD, [facility] 02/22/2020 11:35 AM    [place]

## 2020-02-22 NOTE — Patient Instructions (Signed)
Medication Instructions:  Your physician has recommended you make the following change in your medication:   INCREASE: Hydralazine 50 mg three times daily   *If you need a refill on your cardiac medications before your next appointment, please call your pharmacy*   Lab Work: None.  If you have labs (blood work) drawn today and your tests are completely normal, you will receive your results only by: [name] Kitchen MyChart Message (if you have MyChart) OR . A paper copy in the mail If you have any lab test that is abnormal or we need to change your treatment, we will call you to review the results.   Testing/Procedures: None.    Follow-Up: At [facility], you and your health needs are our priority.  As part of our continuing mission to provide you with exceptional heart care, we have created designated Provider Care Teams.  These Care Teams include your primary Cardiologist (physician) and Advanced Practice Providers (APPs -  Physician Assistants and Nurse Practitioners) who all work together to provide you with the care you need, when you need it.  We recommend signing up for the patient portal called "MyChart".  Sign up information is provided on this After Visit Summary.  MyChart is used to connect with patients for Virtual Visits (Telemedicine).  Patients are able to view lab/test results, encounter notes, upcoming appointments, etc.  Non-urgent messages can be sent to your provider as well.   To learn more about what you can do with MyChart, go to [email or web address].    Your next appointment:   3 month(s)  The format for your next appointment:   In Person  Provider:   Jenne Campus, MD   Other Instructions

## 2020-03-25 DIAGNOSIS — E782 Mixed hyperlipidemia: Secondary | ICD-10-CM | POA: Diagnosis not present

## 2020-03-25 DIAGNOSIS — I4891 Unspecified atrial fibrillation: Secondary | ICD-10-CM | POA: Diagnosis not present

## 2020-03-25 DIAGNOSIS — Z20822 Contact with and (suspected) exposure to covid-19: Secondary | ICD-10-CM | POA: Diagnosis not present

## 2020-03-25 DIAGNOSIS — Z6837 Body mass index (BMI) 37.0-37.9, adult: Secondary | ICD-10-CM | POA: Diagnosis not present

## 2020-03-25 DIAGNOSIS — F209 Schizophrenia, unspecified: Secondary | ICD-10-CM | POA: Diagnosis not present

## 2020-03-25 DIAGNOSIS — D638 Anemia in other chronic diseases classified elsewhere: Secondary | ICD-10-CM | POA: Diagnosis not present

## 2020-03-25 DIAGNOSIS — N183 Chronic kidney disease, stage 3 unspecified: Secondary | ICD-10-CM | POA: Diagnosis not present

## 2020-03-25 DIAGNOSIS — E669 Obesity, unspecified: Secondary | ICD-10-CM | POA: Diagnosis not present

## 2020-05-09 ENCOUNTER — Other Ambulatory Visit: Payer: Self-pay | Admitting: Cardiology

## 2020-05-13 ENCOUNTER — Telehealth: Payer: Self-pay | Admitting: Cardiology

## 2020-05-13 DIAGNOSIS — R0600 Dyspnea, unspecified: Secondary | ICD-10-CM | POA: Diagnosis not present

## 2020-05-13 DIAGNOSIS — G4719 Other hypersomnia: Secondary | ICD-10-CM | POA: Diagnosis not present

## 2020-05-13 DIAGNOSIS — N183 Chronic kidney disease, stage 3 unspecified: Secondary | ICD-10-CM | POA: Diagnosis not present

## 2020-05-13 DIAGNOSIS — E6609 Other obesity due to excess calories: Secondary | ICD-10-CM | POA: Diagnosis not present

## 2020-05-13 NOTE — Telephone Encounter (Signed)
. ° ° °  Heather calling, she said she was advised by pt's pcp to let Dr. Agustin Cree know pt was seen today due to SOB

## 2020-05-15 ENCOUNTER — Telehealth: Payer: Self-pay | Admitting: Cardiology

## 2020-05-15 DIAGNOSIS — R748 Abnormal levels of other serum enzymes: Secondary | ICD-10-CM | POA: Diagnosis not present

## 2020-05-15 NOTE — Telephone Encounter (Signed)
Ailene Ravel from [facility] calling to inform Dr. Agustin Cree that the patients CPK MD and CPK TOTAL labs are abnormal, she advised the trop was normal. Please call.

## 2020-05-16 NOTE — Telephone Encounter (Signed)
Left message for patient to return call.

## 2020-05-16 NOTE — Telephone Encounter (Signed)
Can we schedule patient to have follow-up please

## 2020-05-21 ENCOUNTER — Telehealth: Payer: Self-pay | Admitting: Cardiology

## 2020-05-21 NOTE — Telephone Encounter (Signed)
Nira Conn (pts niece) is calling to give labs (CPK MD, CPK TOTAL AND TROP). Transferred call to [facility].

## 2020-05-21 NOTE — Telephone Encounter (Signed)
Michael Mcknight called back. She had the patient on the line and he gave me verbal permission to speak with Nira Conn his niece and Juliann Pulse his niece about his medical information.   After this I spoke with Nira Conn and informed her that Dr. Agustin Cree was aware of the lab levels she provided and that he was advising we see him for a follow up appointment. I advised her he has a upcoming appointment on July 19th 2021 she informed me that would work. No further questions. They will call if they needed anything in between.

## 2020-05-21 NOTE — Telephone Encounter (Signed)
Patient niece Nira Conn called and wanted to report the patients CPK MB level : 5.0, Troponin- -0.01, and CPK: 749.   Unfortunately I could not speak with Nira Conn as she is not on the patient's dpr form. She is planning to go to him and have him call that way he can give verbal permission for me to speak with Nira Conn. Next time she plans to accompany him to his appointment that way we can get the form updated.   I will await the call from the patient to inform him or his niece with permission from her that he needs an appointment.

## 2020-05-21 NOTE — Telephone Encounter (Signed)
Heather, patients niece, called again to speak with Hayley. She states patient is going to give verbal consent for the office to speak with her and patients sister since there is not a DPR form filled out. Transferred call to [facility].

## 2020-05-23 DIAGNOSIS — R0602 Shortness of breath: Secondary | ICD-10-CM | POA: Diagnosis not present

## 2020-05-23 DIAGNOSIS — G4733 Obstructive sleep apnea (adult) (pediatric): Secondary | ICD-10-CM | POA: Diagnosis not present

## 2020-05-24 DIAGNOSIS — R0602 Shortness of breath: Secondary | ICD-10-CM | POA: Diagnosis not present

## 2020-05-24 DIAGNOSIS — G4733 Obstructive sleep apnea (adult) (pediatric): Secondary | ICD-10-CM | POA: Diagnosis not present

## 2020-05-27 DIAGNOSIS — W57XXXA Bitten or stung by nonvenomous insect and other nonvenomous arthropods, initial encounter: Secondary | ICD-10-CM | POA: Diagnosis not present

## 2020-05-27 DIAGNOSIS — N183 Chronic kidney disease, stage 3 unspecified: Secondary | ICD-10-CM | POA: Diagnosis not present

## 2020-05-27 DIAGNOSIS — F209 Schizophrenia, unspecified: Secondary | ICD-10-CM | POA: Diagnosis not present

## 2020-05-27 DIAGNOSIS — I4891 Unspecified atrial fibrillation: Secondary | ICD-10-CM | POA: Diagnosis not present

## 2020-05-27 DIAGNOSIS — Z6837 Body mass index (BMI) 37.0-37.9, adult: Secondary | ICD-10-CM | POA: Diagnosis not present

## 2020-05-27 DIAGNOSIS — R748 Abnormal levels of other serum enzymes: Secondary | ICD-10-CM | POA: Diagnosis not present

## 2020-05-27 DIAGNOSIS — S0096XA Insect bite (nonvenomous) of unspecified part of head, initial encounter: Secondary | ICD-10-CM | POA: Diagnosis not present

## 2020-05-27 DIAGNOSIS — E669 Obesity, unspecified: Secondary | ICD-10-CM | POA: Diagnosis not present

## 2020-05-28 ENCOUNTER — Telehealth: Payer: Self-pay | Admitting: Cardiology

## 2020-05-28 NOTE — Telephone Encounter (Signed)
Michael Mcknight with [facility] is requesting to speak with Dr. Wendy Poet nurse to discuss lab results. Please call.

## 2020-05-29 NOTE — Telephone Encounter (Signed)
Called back [facility] and spoke with denise. She informed me a troponin was done and was negative.They were doing these tests because get is complaining of malaise, and shortness of breath.

## 2020-05-29 NOTE — Telephone Encounter (Signed)
Patient's pcp wanted to inform Dr. Agustin Cree of the patient's most recent CPK level which was 519 and his MB 6.6.

## 2020-05-29 NOTE — Telephone Encounter (Signed)
Could you please find out why this patient had this test done.  Was there any additional testing what his index, troponin what they were looking for

## 2020-05-30 NOTE — Telephone Encounter (Signed)
Well-that indicated no evidence of myocardial injury.

## 2020-06-03 ENCOUNTER — Ambulatory Visit: Payer: PPO | Admitting: Cardiology

## 2020-06-03 ENCOUNTER — Other Ambulatory Visit: Payer: Self-pay

## 2020-06-03 ENCOUNTER — Encounter: Payer: Self-pay | Admitting: Cardiology

## 2020-06-03 VITALS — BP 114/54 | HR 84 | Ht 67.0 in | Wt 247.4 lb

## 2020-06-03 DIAGNOSIS — F209 Schizophrenia, unspecified: Secondary | ICD-10-CM | POA: Diagnosis not present

## 2020-06-03 DIAGNOSIS — I509 Heart failure, unspecified: Secondary | ICD-10-CM | POA: Diagnosis not present

## 2020-06-03 DIAGNOSIS — I10 Essential (primary) hypertension: Secondary | ICD-10-CM

## 2020-06-03 DIAGNOSIS — I4892 Unspecified atrial flutter: Secondary | ICD-10-CM | POA: Diagnosis not present

## 2020-06-03 DIAGNOSIS — I420 Dilated cardiomyopathy: Secondary | ICD-10-CM

## 2020-06-03 DIAGNOSIS — I429 Cardiomyopathy, unspecified: Secondary | ICD-10-CM

## 2020-06-03 DIAGNOSIS — N1832 Chronic kidney disease, stage 3b: Secondary | ICD-10-CM

## 2020-06-03 DIAGNOSIS — IMO0002 Reserved for concepts with insufficient information to code with codable children: Secondary | ICD-10-CM

## 2020-06-03 DIAGNOSIS — I4891 Unspecified atrial fibrillation: Secondary | ICD-10-CM | POA: Diagnosis not present

## 2020-06-03 NOTE — Patient Instructions (Addendum)
Medication Instructions:  No medication changes. *If you need a refill on your cardiac medications before your next appointment, please call your pharmacy*   Lab Work: You had a CPK and TSH done today in the office. If you have labs (blood work) drawn today and your tests are completely normal, you will receive your results only by: [name] Kitchen MyChart Message (if you have MyChart) OR . A paper copy in the mail If you have any lab test that is abnormal or we need to change your treatment, we will call you to review the results.   Testing/Procedures: Your physician has requested that you have an echocardiogram. Echocardiography is a painless test that uses sound waves to create images of your heart. It provides your doctor with information about the size and shape of your heart and how well your heart's chambers and valves are working. This procedure takes approximately one hour. There are no restrictions for this procedure.     Follow-Up: At [facility], you and your health needs are our priority.  As part of our continuing mission to provide you with exceptional heart care, we have created designated Provider Care Teams.  These Care Teams include your primary Cardiologist (physician) and Advanced Practice Providers (APPs -  Physician Assistants and Nurse Practitioners) who all work together to provide you with the care you need, when you need it.  We recommend signing up for the patient portal called "MyChart".  Sign up information is provided on this After Visit Summary.  MyChart is used to connect with patients for Virtual Visits (Telemedicine).  Patients are able to view lab/test results, encounter notes, upcoming appointments, etc.  Non-urgent messages can be sent to your provider as well.   To learn more about what you can do with MyChart, go to [email or web address].    Your next appointment:   2 month(s)  The format for your next appointment:   In Person  Provider:   Jenne Campus, MD   Other Instructions  Echocardiogram An echocardiogram is a procedure that uses painless sound waves (ultrasound) to produce an image of the heart. Images from an echocardiogram can provide important information about:  Signs of coronary artery disease (CAD).  Aneurysm detection. An aneurysm is a weak or damaged part of an artery wall that bulges out from the normal force of blood pumping through the body.  Heart size and shape. Changes in the size or shape of the heart can be associated with certain conditions, including heart failure, aneurysm, and CAD.  Heart muscle function.  Heart valve function.  Signs of a past heart attack.  Fluid buildup around the heart.  Thickening of the heart muscle.  A tumor or infectious growth around the heart valves. Tell a health care provider about:  Any allergies you have.  All medicines you are taking, including vitamins, herbs, eye drops, creams, and over-the-counter medicines.  Any blood disorders you have.  Any surgeries you have had.  Any medical conditions you have.  Whether you are pregnant or may be pregnant. What are the risks? Generally, this is a safe procedure. However, problems may occur, including:  Allergic reaction to dye (contrast) that may be used during the procedure. What happens before the procedure? No specific preparation is needed. You may eat and drink normally. What happens during the procedure?   An IV tube may be inserted into one of your veins.  You may receive contrast through this tube. A contrast is an injection that improves the  quality of the pictures from your heart.  A gel will be applied to your chest.  A wand-like tool (transducer) will be moved over your chest. The gel will help to transmit the sound waves from the transducer.  The sound waves will harmlessly bounce off of your heart to allow the heart images to be captured in real-time motion. The images will be recorded  on a computer. The procedure may vary among health care providers and hospitals. What happens after the procedure?  You may return to your normal, everyday life, including diet, activities, and medicines, unless your health care provider tells you not to do that. Summary  An echocardiogram is a procedure that uses painless sound waves (ultrasound) to produce an image of the heart.  Images from an echocardiogram can provide important information about the size and shape of your heart, heart muscle function, heart valve function, and fluid buildup around your heart.  You do not need to do anything to prepare before this procedure. You may eat and drink normally.  After the echocardiogram is completed, you may return to your normal, everyday life, unless your health care provider tells you not to do that. This information is not intended to replace advice given to you by your health care provider. Make sure you discuss any questions you have with your health care provider. Document Revised: 02/23/2019 Document Reviewed: 12/05/2016 Elsevier Patient Education  [place].

## 2020-06-03 NOTE — Progress Notes (Signed)
Cardiology Office Note:    Date:  06/03/2020   ID:  Michael Mcknight, DOB 1966/01/31, MRN [id number]  PCP:  Leilani Able, FNP  Cardiologist:  Jenne Campus, MD    Referring MD: Ronita Hipps, MD   No chief complaint on file. I am doing fine  History of Present Illness:    Michael Mcknight is a 54 y.o. male with past medical history significant for cardiomyopathy ejection fraction 35% in November 2020, also schizophrenia, essential hypertension, chronic kidney problem.  Paroxysmal atrial fibrillation.  Comes today 2 months of follow-up.  Recent discovery was the fact that his CPK was elevated, his Crestor has been withdrawn and his CPK dropped from before 700 now to 500.  He denies having any muscle aches.  Complain of having some exertional shortness of breath.  No palpitations, no chest pain no tightness no squeezing no pressure no burning in the chest.  Past Medical History:  Diagnosis Date  . Atrial fibrillation/flutter ([place]) 09/22/2019  . Atypical atrial flutter ([place])   . Benign essential HTN 09/22/2019  . Cardiomegaly 09/22/2019  . CKD (chronic kidney disease), stage III 09/22/2019  . Congestive heart failure due to cardiomyopathy [facility]) New York Heart Association class II 10/23/2019  . Dilated cardiomyopathy ([place]) ejection fraction 35% in November 2020 10/23/2019  . Lobar pneumonia ([place]) 09/22/2019  . Pleural effusion 09/22/2019  . Schizophrenia ([place]) 09/22/2019    Past Surgical History:  Procedure Laterality Date  . CARDIOVERSION N/A 09/26/2019   Procedure: CARDIOVERSION;  Surgeon: Acie Fredrickson Wonda Cheng, MD;  Location: [facility] ENDOSCOPY;  Service: Cardiovascular;  Laterality: N/A;  . TEE WITHOUT CARDIOVERSION N/A 09/26/2019   Procedure: TRANSESOPHAGEAL ECHOCARDIOGRAM (TEE);  Surgeon: Acie Fredrickson Wonda Cheng, MD;  Location: [facility] ENDOSCOPY;  Service: Cardiovascular;  Laterality: N/A;    Current Medications: Current Meds  Medication Sig  . amiodarone (PACERONE) 200 MG tablet Take 1  tablet daily  . apixaban (ELIQUIS) 5 MG TABS tablet Take 1 tablet (5 mg total) by mouth 2 (two) times daily.  [name] Kitchen escitalopram (LEXAPRO) 10 MG tablet Take 10 mg by mouth daily.  . furosemide (LASIX) 40 MG tablet Take 1 tablet (40 mg total) by mouth daily.  . hydrALAZINE (APRESOLINE) 50 MG tablet Take 1 tablet (50 mg total) by mouth 3 (three) times daily.  . isosorbide dinitrate (ISORDIL) 20 MG tablet TAKE 1/2 TABLET (10MG  TOTAL) EVERY 8 HOURS.  [name] Kitchen risperidone (RISPERDAL) 4 MG tablet Take 4 mg by mouth 2 (two) times daily.  . trihexyphenidyl (ARTANE) 2 MG tablet Take 2 mg by mouth 2 (two) times daily.  . Vitamin D, Ergocalciferol, (DRISDOL) 1.25 MG (50000 UNIT) CAPS capsule Take 50,000 Units by mouth once a week.     Allergies:   Codeine   Social History   Socioeconomic History  . Marital status: Married    Spouse name: Not on file  . Number of children: Not on file  . Years of education: Not on file  . Highest education level: Not on file  Occupational History  . Not on file  Tobacco Use  . Smoking status: Never Smoker  . Smokeless tobacco: Never Used  Substance and Sexual Activity  . Alcohol use: Not on file  . Drug use: Not on file  . Sexual activity: Not on file  Other Topics Concern  . Not on file  Social History Narrative  . Not on file   Social Determinants of Health   Financial Resource Strain:   . Difficulty of  Paying Living Expenses:   Food Insecurity:   . Worried About Charity fundraiser in the Last Year:   . Arboriculturist in the Last Year:   Transportation Needs:   . Film/video editor (Medical):   [name] Kitchen Lack of Transportation (Non-Medical):   Physical Activity:   . Days of Exercise per Week:   . Minutes of Exercise per Session:   Stress:   . Feeling of Stress :   Social Connections:   . Frequency of Communication with Friends and Family:   . Frequency of Social Gatherings with Friends and Family:   . Attends Religious Services:   . Active Member of  Clubs or Organizations:   . Attends Archivist Meetings:   [name] Kitchen Marital Status:      Family History: The patient's family history is not on file. ROS:   Please see the history of present illness.    All 14 point review of systems negative except as described per history of present illness  EKGs/Labs/Other Studies Reviewed:      Recent Labs: 09/22/2019: B Natriuretic Peptide 414.1; TSH 1.463 09/24/2019: ALT 39 09/28/2019: Hemoglobin 12.2; Magnesium 2.3; Platelets 499 10/23/2019: BUN 35; Creatinine, Ser 3.20; Potassium 4.8; Sodium 141  Recent Lipid Panel    Component Value Date/Time   CHOL 138 09/23/2019 0247   TRIG 84 09/23/2019 0247   HDL 35 (L) 09/23/2019 0247   CHOLHDL 3.9 09/23/2019 0247   VLDL 17 09/23/2019 0247   LDLCALC 86 09/23/2019 0247    Physical Exam:    VS:  BP (!) 114/54   Pulse 84   Ht 5\' 7"  (1.702 m)   Wt 247 lb 6.4 oz (112.2 kg)   SpO2 97%   BMI 38.75 kg/m     Wt Readings from Last 3 Encounters:  06/03/20 247 lb 6.4 oz (112.2 kg)  02/22/20 244 lb 12.8 oz (111 kg)  01/25/20 241 lb 6.4 oz (109.5 kg)     GEN:  Well nourished, well developed in no acute distress HEENT: Normal NECK: No JVD; No carotid bruits LYMPHATICS: No lymphadenopathy CARDIAC: RRR, no murmurs, no rubs, no gallops RESPIRATORY:  Clear to auscultation without rales, wheezing or rhonchi  ABDOMEN: Soft, non-tender, non-distended MUSCULOSKELETAL:  No edema; No deformity  SKIN: Warm and dry LOWER EXTREMITIES: no swelling NEUROLOGIC:  Alert and oriented x 3 PSYCHIATRIC:  Normal affect   ASSESSMENT:    1. Dilated cardiomyopathy (HCC) ejection fraction 35% in November 2020   2. Congestive heart failure due to cardiomyopathy [facility]) New York Heart Association class II   3. Benign essential HTN   4. Atrial fibrillation/flutter (HCC)   5. Stage 3b chronic kidney disease   6. Schizophrenia, unspecified type ([place])    PLAN:    In order of problems listed above:  1. Dilated  cardiomyopathy.  He is on Apresoline Lasix and Isordil which I will continue.  The biggest limiting factor is his kidney dysfunction.  Therefore, he is not on ARB, not on ACE inhibitor, not on Entresto.  We will continue present management.  I did review K PN which showing the last creatinine from 05/27/2020 being 2.6. 2. Congestive heart failure appears to be compensated.  I will ask him to have echocardiogram to reassess left ventricle ejection fraction. 3. Benign essential hypertension: Blood pressure well controlled continue present management. 4. Paroxysmal atrial fibrillation: Seems to maintain sinus rhythm.  He is on anticoagulation which I will continue. 5. CPK elevation: Improved after  discontinuation of Crestor.  We will check CPK again today, will check his TSH as well. 6. Chronic kidney failure.  Noted.  Creatinine improved.  Reviewing K PN showing creatinine of 2.6. 7. Schizophrenia: Follow-up by psychiatrist.   Medication Adjustments/Labs and Tests Ordered: Current medicines are reviewed at length with the patient today.  Concerns regarding medicines are outlined above.  No orders of the defined types were placed in this encounter.  Medication changes: No orders of the defined types were placed in this encounter.   Signed, Park Liter, MD, [facility] 06/03/2020 10:09 AM    [place]

## 2020-06-04 LAB — TSH: TSH: 1.65 u[IU]/mL (ref 0.450–4.500)

## 2020-06-04 LAB — CK: Total CK: 382 U/L — ABNORMAL HIGH (ref 41–331)

## 2020-06-10 ENCOUNTER — Telehealth: Payer: Self-pay | Admitting: Cardiology

## 2020-06-10 NOTE — Telephone Encounter (Signed)
Heather the patient's niece is returning Hayley's call.

## 2020-06-10 NOTE — Telephone Encounter (Signed)
Called spoke to patient niece informed her per dpr of lab results. No further questions.

## 2020-06-18 ENCOUNTER — Other Ambulatory Visit: Payer: Self-pay | Admitting: Cardiology

## 2020-06-24 ENCOUNTER — Ambulatory Visit (INDEPENDENT_AMBULATORY_CARE_PROVIDER_SITE_OTHER): Payer: PPO

## 2020-06-24 ENCOUNTER — Other Ambulatory Visit: Payer: Self-pay

## 2020-06-24 DIAGNOSIS — I4892 Unspecified atrial flutter: Secondary | ICD-10-CM | POA: Diagnosis not present

## 2020-06-24 DIAGNOSIS — I509 Heart failure, unspecified: Secondary | ICD-10-CM

## 2020-06-24 DIAGNOSIS — I4891 Unspecified atrial fibrillation: Secondary | ICD-10-CM

## 2020-06-24 DIAGNOSIS — IMO0002 Reserved for concepts with insufficient information to code with codable children: Secondary | ICD-10-CM

## 2020-06-24 DIAGNOSIS — I429 Cardiomyopathy, unspecified: Secondary | ICD-10-CM | POA: Diagnosis not present

## 2020-06-24 DIAGNOSIS — I420 Dilated cardiomyopathy: Secondary | ICD-10-CM | POA: Diagnosis not present

## 2020-06-24 LAB — ECHOCARDIOGRAM COMPLETE
Area-P 1/2: 3.37 cm2
S' Lateral: 3.45 cm

## 2020-06-24 NOTE — Progress Notes (Signed)
Complete echocardiogram performed.  Jimmy Volanda Mangine RDCS, RVT  

## 2020-06-26 DIAGNOSIS — I1310 Hypertensive heart and chronic kidney disease without heart failure, with stage 1 through stage 4 chronic kidney disease, or unspecified chronic kidney disease: Secondary | ICD-10-CM

## 2020-06-26 DIAGNOSIS — I5022 Chronic systolic (congestive) heart failure: Secondary | ICD-10-CM | POA: Insufficient documentation

## 2020-06-26 DIAGNOSIS — N184 Chronic kidney disease, stage 4 (severe): Secondary | ICD-10-CM | POA: Insufficient documentation

## 2020-06-26 DIAGNOSIS — E669 Obesity, unspecified: Secondary | ICD-10-CM

## 2020-06-26 HISTORY — DX: Chronic systolic (congestive) heart failure: I50.22

## 2020-06-26 HISTORY — DX: Chronic kidney disease, stage 4 (severe): N18.4

## 2020-06-26 HISTORY — DX: Hypertensive heart and chronic kidney disease without heart failure, with stage 1 through stage 4 chronic kidney disease, or unspecified chronic kidney disease: I13.10

## 2020-06-26 HISTORY — DX: Obesity, unspecified: E66.9

## 2020-07-01 ENCOUNTER — Telehealth: Payer: Self-pay | Admitting: Cardiology

## 2020-07-01 NOTE — Telephone Encounter (Signed)
Results reviewed with Nira Conn (per DPR) as per Dr. Wendy Poet note.  Heather verbalized understanding and had no additional questions. Routed to PCP

## 2020-07-01 NOTE — Telephone Encounter (Signed)
    Pt's niece returning call from [facility] to get echo results

## 2020-07-25 DIAGNOSIS — N281 Cyst of kidney, acquired: Secondary | ICD-10-CM | POA: Diagnosis not present

## 2020-07-25 DIAGNOSIS — N184 Chronic kidney disease, stage 4 (severe): Secondary | ICD-10-CM | POA: Diagnosis not present

## 2020-07-25 DIAGNOSIS — N179 Acute kidney failure, unspecified: Secondary | ICD-10-CM | POA: Diagnosis not present

## 2020-07-25 DIAGNOSIS — E785 Hyperlipidemia, unspecified: Secondary | ICD-10-CM | POA: Diagnosis not present

## 2020-07-25 DIAGNOSIS — I5022 Chronic systolic (congestive) heart failure: Secondary | ICD-10-CM | POA: Diagnosis not present

## 2020-08-21 DIAGNOSIS — F209 Schizophrenia, unspecified: Secondary | ICD-10-CM | POA: Diagnosis not present

## 2020-08-23 ENCOUNTER — Ambulatory Visit: Payer: PPO | Admitting: Cardiology

## 2020-08-27 ENCOUNTER — Other Ambulatory Visit: Payer: Self-pay | Admitting: Cardiology

## 2020-09-04 ENCOUNTER — Other Ambulatory Visit: Payer: Self-pay

## 2020-09-05 ENCOUNTER — Encounter: Payer: Self-pay | Admitting: Cardiology

## 2020-09-05 ENCOUNTER — Ambulatory Visit: Payer: PPO | Admitting: Cardiology

## 2020-09-05 ENCOUNTER — Other Ambulatory Visit: Payer: Self-pay

## 2020-09-05 VITALS — BP 120/70 | HR 72 | Ht 67.0 in | Wt 237.0 lb

## 2020-09-05 DIAGNOSIS — I484 Atypical atrial flutter: Secondary | ICD-10-CM | POA: Diagnosis not present

## 2020-09-05 DIAGNOSIS — I10 Essential (primary) hypertension: Secondary | ICD-10-CM

## 2020-09-05 DIAGNOSIS — I420 Dilated cardiomyopathy: Secondary | ICD-10-CM | POA: Diagnosis not present

## 2020-09-05 DIAGNOSIS — I429 Cardiomyopathy, unspecified: Secondary | ICD-10-CM

## 2020-09-05 DIAGNOSIS — G4733 Obstructive sleep apnea (adult) (pediatric): Secondary | ICD-10-CM | POA: Diagnosis not present

## 2020-09-05 DIAGNOSIS — F209 Schizophrenia, unspecified: Secondary | ICD-10-CM | POA: Diagnosis not present

## 2020-09-05 DIAGNOSIS — I509 Heart failure, unspecified: Secondary | ICD-10-CM | POA: Diagnosis not present

## 2020-09-05 NOTE — Patient Instructions (Signed)

## 2020-09-05 NOTE — Progress Notes (Signed)
Cardiology Office Note:    Date:  09/05/2020   ID:  Michael Mcknight, DOB 01-28-1966, MRN [id number]  PCP:  Michael Able, FNP  Cardiologist:  Michael Campus, MD    Referring MD: Michael Mcknight*   Chief Complaint  Patient presents with  . Follow-up  Doing fine  History of Present Illness:    Michael Mcknight is a 54 y.o. male with past medical history significant for paroxysmal atrial fibrillation, status post cardioversion, anticoagulated and maintaining sinus rhythm, essential hypertension, chronic kidney disease.  Comes today 2 months for follow-up.  Overall doing well denies have any chest pain tightness squeezing pressure burning chest.  Recently repeated his echocardiogram showed improvement to normalization of left ventricle ejection fraction.  Past Medical History:  Diagnosis Date  . Atrial fibrillation/flutter 09/22/2019  . Atypical atrial flutter ([place])   . Benign essential HTN 09/22/2019  . Cardiomegaly 09/22/2019  . Cardiorenal syndrome with renal failure 06/26/2020  . Chronic systolic congestive heart failure ([place]) 06/26/2020  . CKD (chronic kidney disease) stage 4, GFR 15-29 ml/min (HCC) 06/26/2020  . CKD (chronic kidney disease), stage III ([place]) 09/22/2019  . Congestive heart failure due to cardiomyopathy [facility]) New York Heart Association class II 10/23/2019  . Dilated cardiomyopathy ([place]) ejection fraction 35% in November 2020 10/23/2019  . Lobar pneumonia ([place]) 09/22/2019  . Obesity (BMI 30-39.9) 06/26/2020  . Pleural effusion 09/22/2019  . Schizophrenia ([place]) 09/22/2019    Past Surgical History:  Procedure Laterality Date  . CARDIOVERSION N/A 09/26/2019   Procedure: CARDIOVERSION;  Surgeon: Acie Fredrickson Wonda Cheng, MD;  Location[facility] ENDOSCOPY;  Service: Cardiovascular;  Laterality: N/A;  . TEE WITHOUT CARDIOVERSION N/A 09/26/2019   Procedure: TRANSESOPHAGEAL ECHOCARDIOGRAM (TEE);  Surgeon: Acie Fredrickson Wonda Cheng, MD;  Location[facility] ENDOSCOPY;  Service:  Cardiovascular;  Laterality: N/A;    Current Medications: Current Meds  Medication Sig  . amiodarone (PACERONE) 200 MG tablet Take 1 tablet daily  . apixaban (ELIQUIS) 5 MG TABS tablet Take 1 tablet (5 mg total) by mouth 2 (two) times daily.  [name] Kitchen escitalopram (LEXAPRO) 10 MG tablet Take 10 mg by mouth daily.  . furosemide (LASIX) 40 MG tablet TAKE ONE (1) TABLET ONCE DAILY  . hydrALAZINE (APRESOLINE) 50 MG tablet TAKE ONE (1) TABLET BY MOUTH 3 TIMES DAILY  . isosorbide dinitrate (ISORDIL) 20 MG tablet Take 20 mg by mouth 3 (three) times daily.  . risperiDONE (RISPERDAL) 1 MG tablet Take 1 mg by mouth at bedtime.  . risperidone (RISPERDAL) 4 MG tablet Take 4 mg by mouth at bedtime.  . trihexyphenidyl (ARTANE) 2 MG tablet Take 2 mg by mouth 2 (two) times daily.     Allergies:   Codeine   Social History   Socioeconomic History  . Marital status: Married    Spouse name: Not on file  . Number of children: Not on file  . Years of education: Not on file  . Highest education level: Not on file  Occupational History  . Not on file  Tobacco Use  . Smoking status: Never Smoker  . Smokeless tobacco: Never Used  Substance and Sexual Activity  . Alcohol use: Not on file  . Drug use: Not on file  . Sexual activity: Not on file  Other Topics Concern  . Not on file  Social History Narrative  . Not on file   Social Determinants of Health   Financial Resource Strain:   . Difficulty of Paying Living Expenses: Not on file  Food Insecurity:   . Worried About Charity fundraiser in the Last Year: Not on file  . Ran Out of Food in the Last Year: Not on file  Transportation Needs:   . Lack of Transportation (Medical): Not on file  . Lack of Transportation (Non-Medical): Not on file  Physical Activity:   . Days of Exercise per Week: Not on file  . Minutes of Exercise per Session: Not on file  Stress:   . Feeling of Stress : Not on file  Social Connections:   . Frequency of Communication  with Friends and Family: Not on file  . Frequency of Social Gatherings with Friends and Family: Not on file  . Attends Religious Services: Not on file  . Active Member of Clubs or Organizations: Not on file  . Attends Archivist Meetings: Not on file  . Marital Status: Not on file     Family History: The patient's family history is not on file. ROS:   Please see the history of present illness.    All 14 point review of systems negative except as described per history of present illness  EKGs/Labs/Other Studies Reviewed:      Recent Labs: 09/22/2019: B Natriuretic Peptide 414.1 09/24/2019: ALT 39 09/28/2019: Hemoglobin 12.2; Magnesium 2.3; Platelets 499 10/23/2019: BUN 35; Creatinine, Ser 3.20; Potassium 4.8; Sodium 141 06/03/2020: TSH 1.650  Recent Lipid Panel    Component Value Date/Time   CHOL 138 09/23/2019 0247   TRIG 84 09/23/2019 0247   HDL 35 (L) 09/23/2019 0247   CHOLHDL 3.9 09/23/2019 0247   VLDL 17 09/23/2019 0247   LDLCALC 86 09/23/2019 0247    Physical Exam:    VS:  BP 120/70 (BP Location: Right Arm, Patient Position: Sitting, Cuff Size: Normal)   Pulse 72   Ht 5\' 7"  (1.702 m)   Wt 237 lb (107.5 kg)   SpO2 94%   BMI 37.12 kg/m     Wt Readings from Last 3 Encounters:  09/05/20 237 lb (107.5 kg)  06/03/20 247 lb 6.4 oz (112.2 kg)  02/22/20 244 lb 12.8 oz (111 kg)     GEN:  Well nourished, well developed in no acute distress HEENT: Normal NECK: No JVD; No carotid bruits LYMPHATICS: No lymphadenopathy CARDIAC: RRR, no murmurs, no rubs, no gallops RESPIRATORY:  Clear to auscultation without rales, wheezing or rhonchi  ABDOMEN: Soft, non-tender, non-distended MUSCULOSKELETAL:  No edema; No deformity  SKIN: Warm and dry LOWER EXTREMITIES: no swelling NEUROLOGIC:  Alert and oriented x 3 PSYCHIATRIC:  Normal affect   ASSESSMENT:    1. Dilated cardiomyopathy (HCC) ejection fraction 35% in November 2020   2. Congestive heart failure due to  cardiomyopathy [facility]) New York Heart Association class II   3. Benign essential HTN   4. Schizophrenia, unspecified type ([place])   5. Atypical atrial flutter (HCC)    PLAN:    In order of problems listed above:  1. Dilated cardiomyopathy now with normalization.  He is on appropriate medication which I will continue.  He is not on ACE inhibitor or ARB or Entresto secondary to kidney dysfunction that being followed by nephrology.  Overall hemodynamically compensated. 2. Congestive heart failure now compensated.  Improvement left ventricle ejection fraction. 3. Benign essential high blood pressure, continue present management blood pressure well controlled. 4. Schizophrenia: Followed by psychiatry. 5. Paroxysmal atrial fibrillation/flutter.  He is maintaining sinus rhythm on amiodarone 200 mg daily which I will continue, however next time I see him  in 6 months anticipate need to decrease the dose of amiodarone.  He is anticoagulated which I will continue.  His chads 2 Vascor is 3 for congestive heart failure as well as essential hypertension.   Medication Adjustments/Labs and Tests Ordered: Current medicines are reviewed at length with the patient today.  Concerns regarding medicines are outlined above.  No orders of the defined types were placed in this encounter.  Medication changes: No orders of the defined types were placed in this encounter.   Signed, Park Liter, MD, [facility] 09/05/2020 9:54 AM    [place]

## 2020-09-24 DIAGNOSIS — N183 Chronic kidney disease, stage 3 unspecified: Secondary | ICD-10-CM | POA: Diagnosis not present

## 2020-09-24 DIAGNOSIS — F209 Schizophrenia, unspecified: Secondary | ICD-10-CM | POA: Diagnosis not present

## 2020-09-24 DIAGNOSIS — D638 Anemia in other chronic diseases classified elsewhere: Secondary | ICD-10-CM | POA: Diagnosis not present

## 2020-09-24 DIAGNOSIS — Z79899 Other long term (current) drug therapy: Secondary | ICD-10-CM | POA: Diagnosis not present

## 2020-09-24 DIAGNOSIS — I4891 Unspecified atrial fibrillation: Secondary | ICD-10-CM | POA: Diagnosis not present

## 2020-09-24 DIAGNOSIS — E6609 Other obesity due to excess calories: Secondary | ICD-10-CM | POA: Diagnosis not present

## 2020-09-24 DIAGNOSIS — E559 Vitamin D deficiency, unspecified: Secondary | ICD-10-CM | POA: Diagnosis not present

## 2020-09-25 ENCOUNTER — Telehealth: Payer: Self-pay | Admitting: Cardiology

## 2020-09-25 NOTE — Telephone Encounter (Signed)
Ailene Ravel, NP, from [place] states the patient was seen and his BP has been ranging 98-110/53's -64's. She would like to know if his medications need to be changed. She states he has been dizzy while standing, but otherwise feels fine. She would like to know if he can hold his hydralazine.

## 2020-09-25 NOTE — Telephone Encounter (Signed)
She can d/c HCTZ

## 2020-09-27 NOTE — Telephone Encounter (Signed)
Left message for patient to return call.

## 2020-10-01 NOTE — Telephone Encounter (Signed)
  Sister is returning call, she would like a call back tomorrow because she will be home tomorrow

## 2020-10-02 NOTE — Telephone Encounter (Signed)
Called patient sister per dpr. Informed her that Dr. Agustin Cree said patient can stop hydralazine. I asked her to let us know if his blood pressure and dizziness doesn't get better after stopping. She verbally understood. No further questions.

## 2020-10-03 ENCOUNTER — Other Ambulatory Visit: Payer: Self-pay | Admitting: Cardiology

## 2020-10-04 DIAGNOSIS — E669 Obesity, unspecified: Secondary | ICD-10-CM | POA: Diagnosis not present

## 2020-10-04 DIAGNOSIS — N184 Chronic kidney disease, stage 4 (severe): Secondary | ICD-10-CM | POA: Diagnosis not present

## 2020-10-04 DIAGNOSIS — N281 Cyst of kidney, acquired: Secondary | ICD-10-CM | POA: Insufficient documentation

## 2020-10-04 DIAGNOSIS — I5022 Chronic systolic (congestive) heart failure: Secondary | ICD-10-CM | POA: Diagnosis not present

## 2020-10-04 DIAGNOSIS — I1310 Hypertensive heart and chronic kidney disease without heart failure, with stage 1 through stage 4 chronic kidney disease, or unspecified chronic kidney disease: Secondary | ICD-10-CM | POA: Diagnosis not present

## 2020-10-04 HISTORY — DX: Cyst of kidney, acquired: N28.1

## 2020-10-06 DIAGNOSIS — G4733 Obstructive sleep apnea (adult) (pediatric): Secondary | ICD-10-CM | POA: Diagnosis not present

## 2020-10-14 ENCOUNTER — Other Ambulatory Visit: Payer: Self-pay | Admitting: Cardiology

## 2020-10-14 NOTE — Telephone Encounter (Signed)
Rx request sent to pharmacy.  

## 2020-10-21 DIAGNOSIS — F209 Schizophrenia, unspecified: Secondary | ICD-10-CM | POA: Diagnosis not present

## 2020-11-05 DIAGNOSIS — G4733 Obstructive sleep apnea (adult) (pediatric): Secondary | ICD-10-CM | POA: Diagnosis not present

## 2020-12-17 DIAGNOSIS — I129 Hypertensive chronic kidney disease with stage 1 through stage 4 chronic kidney disease, or unspecified chronic kidney disease: Secondary | ICD-10-CM | POA: Diagnosis not present

## 2020-12-17 DIAGNOSIS — Z6837 Body mass index (BMI) 37.0-37.9, adult: Secondary | ICD-10-CM | POA: Diagnosis not present

## 2020-12-17 DIAGNOSIS — E669 Obesity, unspecified: Secondary | ICD-10-CM | POA: Diagnosis not present

## 2020-12-17 DIAGNOSIS — M25572 Pain in left ankle and joints of left foot: Secondary | ICD-10-CM | POA: Diagnosis not present

## 2020-12-20 DIAGNOSIS — M19072 Primary osteoarthritis, left ankle and foot: Secondary | ICD-10-CM | POA: Diagnosis not present

## 2020-12-24 ENCOUNTER — Other Ambulatory Visit: Payer: Self-pay | Admitting: Cardiology

## 2021-01-03 DIAGNOSIS — N184 Chronic kidney disease, stage 4 (severe): Secondary | ICD-10-CM | POA: Diagnosis not present

## 2021-01-06 DIAGNOSIS — G4733 Obstructive sleep apnea (adult) (pediatric): Secondary | ICD-10-CM | POA: Diagnosis not present

## 2021-02-03 DIAGNOSIS — G4733 Obstructive sleep apnea (adult) (pediatric): Secondary | ICD-10-CM | POA: Diagnosis not present

## 2021-02-19 DIAGNOSIS — E669 Obesity, unspecified: Secondary | ICD-10-CM | POA: Diagnosis not present

## 2021-02-19 DIAGNOSIS — N184 Chronic kidney disease, stage 4 (severe): Secondary | ICD-10-CM | POA: Diagnosis not present

## 2021-02-19 DIAGNOSIS — N281 Cyst of kidney, acquired: Secondary | ICD-10-CM | POA: Diagnosis not present

## 2021-02-19 DIAGNOSIS — I1310 Hypertensive heart and chronic kidney disease without heart failure, with stage 1 through stage 4 chronic kidney disease, or unspecified chronic kidney disease: Secondary | ICD-10-CM | POA: Diagnosis not present

## 2021-03-03 DIAGNOSIS — N281 Cyst of kidney, acquired: Secondary | ICD-10-CM | POA: Diagnosis not present

## 2021-03-06 ENCOUNTER — Other Ambulatory Visit: Payer: Self-pay | Admitting: Cardiology

## 2021-03-27 ENCOUNTER — Other Ambulatory Visit: Payer: Self-pay

## 2021-03-31 ENCOUNTER — Other Ambulatory Visit: Payer: Self-pay

## 2021-03-31 ENCOUNTER — Ambulatory Visit: Payer: PPO | Admitting: Cardiology

## 2021-03-31 ENCOUNTER — Encounter: Payer: Self-pay | Admitting: Cardiology

## 2021-03-31 VITALS — BP 102/60 | HR 69 | Ht 67.0 in | Wt 229.0 lb

## 2021-03-31 DIAGNOSIS — F209 Schizophrenia, unspecified: Secondary | ICD-10-CM

## 2021-03-31 DIAGNOSIS — I484 Atypical atrial flutter: Secondary | ICD-10-CM

## 2021-03-31 DIAGNOSIS — I10 Essential (primary) hypertension: Secondary | ICD-10-CM

## 2021-03-31 DIAGNOSIS — I5022 Chronic systolic (congestive) heart failure: Secondary | ICD-10-CM

## 2021-03-31 DIAGNOSIS — N1832 Chronic kidney disease, stage 3b: Secondary | ICD-10-CM

## 2021-03-31 DIAGNOSIS — I420 Dilated cardiomyopathy: Secondary | ICD-10-CM

## 2021-03-31 MED ORDER — FUROSEMIDE 20 MG PO TABS: 20.0000 mg | ORAL_TABLET | Freq: Every day | ORAL | 1 refills | Status: DC

## 2021-03-31 NOTE — Patient Instructions (Signed)
Medication Instructions:  Your physician has recommended you make the following change in your medication:   DECREASE: Lasix to 20 mg daily   *If you need a refill on your cardiac medications before your next appointment, please call your pharmacy*   Lab Work: None If you have labs (blood work) drawn today and your tests are completely normal, you will receive your results only by: [name] Kitchen MyChart Message (if you have MyChart) OR . A paper copy in the mail If you have any lab test that is abnormal or we need to change your treatment, we will call you to review the results.   Testing/Procedures: Your physician has requested that you have an echocardiogram. Echocardiography is a painless test that uses sound waves to create images of your heart. It provides your doctor with information about the size and shape of your heart and how well your heart's chambers and valves are working. This procedure takes approximately one hour. There are no restrictions for this procedure.     Follow-Up: At [facility] you and your health needs are our priority.  As part of our continuing mission to provide you with exceptional heart care, we have created designated Provider Care Teams.  These Care Teams include your primary Cardiologist (physician) and Advanced Practice Providers (APPs -  Physician Assistants and Nurse Practitioners) who all work together to provide you with the care you need, when you need it.  We recommend signing up for the patient portal called "MyChart".  Sign up information is provided on this After Visit Summary.  MyChart is used to connect with patients for Virtual Visits (Telemedicine).  Patients are able to view lab/test results, encounter notes, upcoming appointments, etc.  Non-urgent messages can be sent to your provider as well.   To learn more about what you can do with MyChart, go to [email or web address].    Your next appointment:   3 month(s)  The format for your next  appointment:   In Person  Provider:   Jenne Campus, MD   Other Instructions

## 2021-03-31 NOTE — Addendum Note (Signed)
Addended by: Senaida Ores on: 03/31/2021 01:49 PM   Modules accepted: Orders

## 2021-03-31 NOTE — Progress Notes (Signed)
Cardiology Office Note:    Date:  03/31/2021   ID:  Michael Mcknight, DOB 01-26-1966, MRN [id number]  PCP:  Leilani Able, FNP  Cardiologist:  Jenne Campus, MD    Referring MD: Farrel Conners*   Chief Complaint  Patient presents with  . Dizziness    History of Present Illness:    Michael Mcknight is a 55 y.o. male with past medical history significant for paroxysmal atrial fibrillation status post cardioversion now maintaining sinus rhythm, essential hypertension, chronic kidney disease, history of cardiomyopathy ejection fraction being diminished however latest echocardiogram showed normalization of left ventricle ejection fraction.  He comes today to my office he complained of having dizziness when he is getting up very quickly.  He never passed out and he never fell down because of this but it bothers him somewhat.  Denies have any chest pain tightness squeezing pressure burning chest no palpitations no swelling of lower extremities no proximal nocturnal dyspnea.  Past Medical History:  Diagnosis Date  . Atrial fibrillation/flutter 09/22/2019  . Atypical atrial flutter ([place])   . Benign essential HTN 09/22/2019  . Cardiomegaly 09/22/2019  . Cardiorenal syndrome with renal failure 06/26/2020  . Chronic systolic congestive heart failure ([place]) 06/26/2020  . CKD (chronic kidney disease) stage 4, GFR 15-29 ml/min (HCC) 06/26/2020  . CKD (chronic kidney disease), stage III ([place]) 09/22/2019  . Congestive heart failure due to cardiomyopathy [facility]) New York Heart Association class II 10/23/2019  . Cyst of left kidney 10/04/2020  . Dilated cardiomyopathy ([place]) ejection fraction 35% in November 2020 10/23/2019  . Lobar pneumonia ([place]) 09/22/2019  . Obesity (BMI 30-39.9) 06/26/2020  . Pleural effusion 09/22/2019  . Schizophrenia ([place]) 09/22/2019    Past Surgical History:  Procedure Laterality Date  . CARDIOVERSION N/A 09/26/2019   Procedure: CARDIOVERSION;  Surgeon: Acie Fredrickson  Wonda Cheng, MD;  Location: [facility] ENDOSCOPY;  Service: Cardiovascular;  Laterality: N/A;  . TEE WITHOUT CARDIOVERSION N/A 09/26/2019   Procedure: TRANSESOPHAGEAL ECHOCARDIOGRAM (TEE);  Surgeon: Acie Fredrickson Wonda Cheng, MD;  Location: [facility] ENDOSCOPY;  Service: Cardiovascular;  Laterality: N/A;    Current Medications: Current Meds  Medication Sig  . amiodarone (PACERONE) 200 MG tablet TAKE ONE (1) TABLET ONCE DAILY (Patient taking differently: Take 200 mg by mouth daily. TAKE ONE (1) TABLET ONCE DAILY)  . ELIQUIS 5 MG TABS tablet TAKE ONE TABLET TWICE DAILY (Patient taking differently: Take 5 mg by mouth 2 (two) times daily.)  . escitalopram (LEXAPRO) 10 MG tablet Take 10 mg by mouth daily.  . furosemide (LASIX) 40 MG tablet TAKE ONE (1) TABLET ONCE DAILY (Patient taking differently: Take 40 mg by mouth daily.)  . risperiDONE (RISPERDAL) 1 MG tablet Take 1 mg by mouth at bedtime.  . trihexyphenidyl (ARTANE) 2 MG tablet Take 2 mg by mouth 2 (two) times daily.  . Vitamin D, Ergocalciferol, (DRISDOL) 1.25 MG (50000 UNIT) CAPS capsule Take 50,000 Units by mouth once a week.  . [DISCONTINUED] risperidone (RISPERDAL) 4 MG tablet Take 4 mg by mouth at bedtime.     Allergies:   Codeine   Social History   Socioeconomic History  . Marital status: Married    Spouse name: Not on file  . Number of children: Not on file  . Years of education: Not on file  . Highest education level: Not on file  Occupational History  . Not on file  Tobacco Use  . Smoking status: Never Smoker  . Smokeless tobacco: Never Used  Substance and  Sexual Activity  . Alcohol use: Not on file  . Drug use: Not on file  . Sexual activity: Not on file  Other Topics Concern  . Not on file  Social History Narrative  . Not on file   Social Determinants of Health   Financial Resource Strain: Not on file  Food Insecurity: Not on file  Transportation Needs: Not on file  Physical Activity: Not on file  Stress: Not on file  Social  Connections: Not on file     Family History: The patient's family history is not on file. ROS:   Please see the history of present illness.    All 14 point review of systems negative except as described per history of present illness  EKGs/Labs/Other Studies Reviewed:      Recent Labs: 06/03/2020: TSH 1.650  Recent Lipid Panel    Component Value Date/Time   CHOL 138 09/23/2019 0247   TRIG 84 09/23/2019 0247   HDL 35 (L) 09/23/2019 0247   CHOLHDL 3.9 09/23/2019 0247   VLDL 17 09/23/2019 0247   LDLCALC 86 09/23/2019 0247    Physical Exam:    VS:  BP 102/60 (BP Location: Left Arm, Patient Position: Sitting)   Pulse 69   Ht 5\' 7"  (1.702 m)   Wt 229 lb (103.9 kg)   SpO2 97%   BMI 35.87 kg/m     Wt Readings from Last 3 Encounters:  03/31/21 229 lb (103.9 kg)  09/05/20 237 lb (107.5 kg)  06/03/20 247 lb 6.4 oz (112.2 kg)     GEN:  Well nourished, well developed in no acute distress HEENT: Normal NECK: No JVD; No carotid bruits LYMPHATICS: No lymphadenopathy CARDIAC: RRR, no murmurs, no rubs, no gallops RESPIRATORY:  Clear to auscultation without rales, wheezing or rhonchi  ABDOMEN: Soft, non-tender, non-distended MUSCULOSKELETAL:  No edema; No deformity  SKIN: Warm and dry LOWER EXTREMITIES: no swelling NEUROLOGIC:  Alert and oriented x 3 PSYCHIATRIC:  Normal affect   ASSESSMENT:    1. Dilated cardiomyopathy (HCC) ejection fraction 35% in November 2020   2. Chronic systolic congestive heart failure ([place])   3. Benign essential HTN   4. Atypical atrial flutter (HCC)   5. Stage 3b chronic kidney disease ([place])   6. Schizophrenia, unspecified type ([place])    PLAN:    In order of problems listed above:  1. Dilated cardiomyopathy ejection fraction 35% in November 2020 but latest echocardiogram showing normalization.  He demonstrates signs and symptoms of orthostatic hypotension.  I will reduce dose of diuretic from 40 to 20 mg daily.  I will ask him to have another  echocardiogram done to recheck left ventricle ejection fraction.  I warned him about signs and symptoms of decompensated congestive heart failure meaning swelling of lower extremity shortness of breath or ask him to let me know if it happens. 2. Chronic systolic congestive heart failure but again latest echocardiogram showed normalization. 3. Benign essential hypertension, blood pressure is on the lower side which I think is responsible for his symptomatology. 4. Atypical atrial flutter maintained sinus rhythm anticoagulated which I will continue. 5. Chronic kidney disease followed by nephrology. 6. Schizophrenia stable.   Medication Adjustments/Labs and Tests Ordered: Current medicines are reviewed at length with the patient today.  Concerns regarding medicines are outlined above.  No orders of the defined types were placed in this encounter.  Medication changes: No orders of the defined types were placed in this encounter.   Signed, Park Liter, MD,  [facility] 03/31/2021 1:42 PM    [facility]

## 2021-04-01 NOTE — Addendum Note (Signed)
Addended by: Senaida Ores on: 04/01/2021 08:31 AM   Modules accepted: Orders

## 2021-04-05 DIAGNOSIS — G4733 Obstructive sleep apnea (adult) (pediatric): Secondary | ICD-10-CM | POA: Diagnosis not present

## 2021-04-23 ENCOUNTER — Other Ambulatory Visit: Payer: PPO

## 2021-05-02 ENCOUNTER — Other Ambulatory Visit: Payer: Self-pay | Admitting: Cardiology

## 2021-05-14 ENCOUNTER — Ambulatory Visit (INDEPENDENT_AMBULATORY_CARE_PROVIDER_SITE_OTHER): Payer: PPO

## 2021-05-14 ENCOUNTER — Other Ambulatory Visit: Payer: Self-pay

## 2021-05-14 DIAGNOSIS — I5022 Chronic systolic (congestive) heart failure: Secondary | ICD-10-CM | POA: Diagnosis not present

## 2021-05-14 DIAGNOSIS — I484 Atypical atrial flutter: Secondary | ICD-10-CM | POA: Diagnosis not present

## 2021-05-14 DIAGNOSIS — I420 Dilated cardiomyopathy: Secondary | ICD-10-CM

## 2021-05-14 DIAGNOSIS — I10 Essential (primary) hypertension: Secondary | ICD-10-CM | POA: Diagnosis not present

## 2021-05-14 DIAGNOSIS — F209 Schizophrenia, unspecified: Secondary | ICD-10-CM | POA: Diagnosis not present

## 2021-05-14 DIAGNOSIS — N1832 Chronic kidney disease, stage 3b: Secondary | ICD-10-CM

## 2021-05-14 LAB — ECHOCARDIOGRAM COMPLETE
Area-P 1/2: 3.99 cm2
S' Lateral: 2.7 cm

## 2021-05-14 NOTE — Progress Notes (Signed)
Complete echocardiogram performed.  Jimmy Stephon Weathers RDCS, RVT  

## 2021-07-01 ENCOUNTER — Ambulatory Visit: Payer: PPO | Admitting: Cardiology

## 2021-07-28 ENCOUNTER — Other Ambulatory Visit: Payer: Self-pay | Admitting: Cardiology

## 2021-07-31 DIAGNOSIS — F209 Schizophrenia, unspecified: Secondary | ICD-10-CM | POA: Diagnosis not present

## 2021-08-04 DIAGNOSIS — N184 Chronic kidney disease, stage 4 (severe): Secondary | ICD-10-CM | POA: Diagnosis not present

## 2021-08-18 DIAGNOSIS — E669 Obesity, unspecified: Secondary | ICD-10-CM | POA: Diagnosis not present

## 2021-08-18 DIAGNOSIS — N1832 Chronic kidney disease, stage 3b: Secondary | ICD-10-CM | POA: Diagnosis not present

## 2021-08-18 DIAGNOSIS — I1310 Hypertensive heart and chronic kidney disease without heart failure, with stage 1 through stage 4 chronic kidney disease, or unspecified chronic kidney disease: Secondary | ICD-10-CM | POA: Diagnosis not present

## 2021-09-19 ENCOUNTER — Inpatient Hospital Stay (HOSPITAL_COMMUNITY)
Admission: EM | Admit: 2021-09-19 | Discharge: 2021-09-22 | DRG: 683 | Disposition: A | Discharge status: Home / Self Care | Payer: PPO | Attending: Internal Medicine | Admitting: Internal Medicine

## 2021-09-19 ENCOUNTER — Other Ambulatory Visit: Payer: Self-pay

## 2021-09-19 ENCOUNTER — Encounter (HOSPITAL_COMMUNITY): Payer: Self-pay | Admitting: Emergency Medicine

## 2021-09-19 ENCOUNTER — Ambulatory Visit: Payer: PPO | Admitting: Cardiology

## 2021-09-19 ENCOUNTER — Encounter: Payer: Self-pay | Admitting: Cardiology

## 2021-09-19 VITALS — BP 110/70 | HR 78 | Ht 67.0 in | Wt 222.0 lb

## 2021-09-19 DIAGNOSIS — I5042 Chronic combined systolic (congestive) and diastolic (congestive) heart failure: Secondary | ICD-10-CM | POA: Diagnosis present

## 2021-09-19 DIAGNOSIS — I951 Orthostatic hypotension: Secondary | ICD-10-CM

## 2021-09-19 DIAGNOSIS — I420 Dilated cardiomyopathy: Secondary | ICD-10-CM | POA: Diagnosis not present

## 2021-09-19 DIAGNOSIS — I4510 Unspecified right bundle-branch block: Secondary | ICD-10-CM | POA: Diagnosis present

## 2021-09-19 DIAGNOSIS — E872 Acidosis, unspecified: Secondary | ICD-10-CM | POA: Diagnosis present

## 2021-09-19 DIAGNOSIS — R55 Syncope and collapse: Secondary | ICD-10-CM

## 2021-09-19 DIAGNOSIS — N179 Acute kidney failure, unspecified: Principal | ICD-10-CM

## 2021-09-19 DIAGNOSIS — R197 Diarrhea, unspecified: Secondary | ICD-10-CM | POA: Diagnosis present

## 2021-09-19 DIAGNOSIS — F209 Schizophrenia, unspecified: Secondary | ICD-10-CM

## 2021-09-19 DIAGNOSIS — I5022 Chronic systolic (congestive) heart failure: Secondary | ICD-10-CM | POA: Diagnosis present

## 2021-09-19 DIAGNOSIS — Z7901 Long term (current) use of anticoagulants: Secondary | ICD-10-CM

## 2021-09-19 DIAGNOSIS — E871 Hypo-osmolality and hyponatremia: Secondary | ICD-10-CM | POA: Diagnosis present

## 2021-09-19 DIAGNOSIS — I484 Atypical atrial flutter: Secondary | ICD-10-CM | POA: Diagnosis not present

## 2021-09-19 DIAGNOSIS — Z79899 Other long term (current) drug therapy: Secondary | ICD-10-CM

## 2021-09-19 DIAGNOSIS — N184 Chronic kidney disease, stage 4 (severe): Secondary | ICD-10-CM | POA: Diagnosis present

## 2021-09-19 DIAGNOSIS — I480 Paroxysmal atrial fibrillation: Secondary | ICD-10-CM

## 2021-09-19 DIAGNOSIS — G909 Disorder of the autonomic nervous system, unspecified: Secondary | ICD-10-CM | POA: Diagnosis present

## 2021-09-19 DIAGNOSIS — I130 Hypertensive heart and chronic kidney disease with heart failure and stage 1 through stage 4 chronic kidney disease, or unspecified chronic kidney disease: Secondary | ICD-10-CM | POA: Diagnosis present

## 2021-09-19 DIAGNOSIS — R9431 Abnormal electrocardiogram [ECG] [EKG]: Secondary | ICD-10-CM

## 2021-09-19 DIAGNOSIS — I10 Essential (primary) hypertension: Secondary | ICD-10-CM

## 2021-09-19 DIAGNOSIS — Z20822 Contact with and (suspected) exposure to covid-19: Secondary | ICD-10-CM | POA: Diagnosis present

## 2021-09-19 DIAGNOSIS — Z885 Allergy status to narcotic agent status: Secondary | ICD-10-CM

## 2021-09-19 DIAGNOSIS — E861 Hypovolemia: Secondary | ICD-10-CM | POA: Diagnosis present

## 2021-09-19 LAB — CBC WITH DIFFERENTIAL/PLATELET
Abs Immature Granulocytes: 0.03 10*3/uL (ref 0.00–0.07)
Basophils Absolute: 0 10*3/uL (ref 0.0–0.1)
Basophils Relative: 0 %
Eosinophils Absolute: 0 10*3/uL (ref 0.0–0.5)
Eosinophils Relative: 0 %
HCT: 36.7 % — ABNORMAL LOW (ref 39.0–52.0)
Hemoglobin: 12 g/dL — ABNORMAL LOW (ref 13.0–17.0)
Immature Granulocytes: 0 %
Lymphocytes Relative: 8 %
Lymphs Abs: 0.7 10*3/uL (ref 0.7–4.0)
MCH: 28.5 pg (ref 26.0–34.0)
MCHC: 32.7 g/dL (ref 30.0–36.0)
MCV: 87.2 fL (ref 80.0–100.0)
Monocytes Absolute: 1.3 10*3/uL — ABNORMAL HIGH (ref 0.1–1.0)
Monocytes Relative: 14 %
Neutro Abs: 7.3 10*3/uL (ref 1.7–7.7)
Neutrophils Relative %: 78 %
Platelets: 309 10*3/uL (ref 150–400)
RBC: 4.21 MIL/uL — ABNORMAL LOW (ref 4.22–5.81)
RDW: 14.7 % (ref 11.5–15.5)
WBC: 9.3 10*3/uL (ref 4.0–10.5)
nRBC: 0 % (ref 0.0–0.2)

## 2021-09-19 NOTE — Patient Instructions (Signed)
Medication Instructions:  Your physician has recommended you make the following change in your medication:  STOP: Furosemide  DECREASE: Amiodarone 100 mg per 0.5 tablet by mouth daily.  *If you need a refill on your cardiac medications before your next appointment, please call your pharmacy*   Lab Work: None If you have labs (blood work) drawn today and your tests are completely normal, you will receive your results only by: [place] (if you have MyChart) OR A paper copy in the mail If you have any lab test that is abnormal or we need to change your treatment, we will call you to review the results.   Testing/Procedures: None   Follow-Up: At [facility], you and your health needs are our priority.  As part of our continuing mission to provide you with exceptional heart care, we have created designated Provider Care Teams.  These Care Teams include your primary Cardiologist (physician) and Advanced Practice Providers (APPs -  Physician Assistants and Nurse Practitioners) who all work together to provide you with the care you need, when you need it.  We recommend signing up for the patient portal called "MyChart".  Sign up information is provided on this After Visit Summary.  MyChart is used to connect with patients for Virtual Visits (Telemedicine).  Patients are able to view lab/test results, encounter notes, upcoming appointments, etc.  Non-urgent messages can be sent to your provider as well.   To learn more about what you can do with MyChart, go to [email or web address].    Your next appointment:   5 month(s)  The format for your next appointment:   In Person  Provider:   Jenne Campus, MD    Other Instructions

## 2021-09-19 NOTE — Progress Notes (Signed)
Cardiology Office Note:    Date:  09/19/2021   ID:  Michael Mcknight, DOB May 04, 1966, MRN [id number]  PCP:  Leilani Able, FNP  Cardiologist:  Jenne Campus, MD    Referring MD: Farrel Conners*   Chief Complaint  Patient presents with   Follow-up  Feeling better but still dizzy when getting up  History of Present Illness:    Michael Mcknight is a 55 y.o. male with past medical history significant for prior paroxysmal atrial flutter, status post cardioversion now seems to maintaining sinus rhythm on amiodarone.  Chronic kidney disease, history of cardiomyopathy detected in 2020 with ejection fraction 30% now with normalization.  Last time I see him concern was about the fact that when he gets up quickly he had dizzy I cut down his diuretic and a half.  He said the dizziness improved but still present. He denies have any chest pain tightness squeezing pressure burning chest.  Past Medical History:  Diagnosis Date   Atrial fibrillation/flutter 09/22/2019   Atypical atrial flutter (HCC)    Benign essential HTN 09/22/2019   Cardiomegaly 09/22/2019   Cardiorenal syndrome with renal failure 5/46/5035   Chronic systolic congestive heart failure ([place]) 06/26/2020   CKD (chronic kidney disease) stage 4, GFR 15-29 ml/min ([place]) 06/26/2020   CKD (chronic kidney disease), stage III ([place]) 09/22/2019   Congestive heart failure due to cardiomyopathy [facility]) New York Heart Association class II 10/23/2019   Cyst of left kidney 10/04/2020   Dilated cardiomyopathy (HCC) ejection fraction 35% in November 2020 10/23/2019   Lobar pneumonia ([place]) 09/22/2019   Obesity (BMI 30-39.9) 06/26/2020   Pleural effusion 09/22/2019   Schizophrenia ([place]) 09/22/2019    Past Surgical History:  Procedure Laterality Date   CARDIOVERSION N/A 09/26/2019   Procedure: CARDIOVERSION;  Surgeon: Nahser, Wonda Cheng, MD;  Location: [place];  Service: Cardiovascular;  Laterality: N/A;   TEE WITHOUT CARDIOVERSION N/A  09/26/2019   Procedure: TRANSESOPHAGEAL ECHOCARDIOGRAM (TEE);  Surgeon: Acie Fredrickson Wonda Cheng, MD;  Location[facility] END[facility]COPY;  Service: Cardiovascular;  Laterality: N/A;    Current Medications: Current Meds  Medication Sig   amiodarone (PACERONE) 200 MG tablet Take 200 mg by mouth daily.   apixaban (ELIQUIS) 5 MG TABS tablet Take 1 tablet (5 mg total) by mouth 2 (two) times daily.   escitalopram (LEXAPRO) 10 MG tablet Take 10 mg by mouth daily.   furosemide (LASIX) 20 MG tablet Take 1 tablet (20 mg total) by mouth daily.   risperiDONE (RISPERDAL) 1 MG tablet Take 1 mg by mouth at bedtime.   trihexyphenidyl (ARTANE) 2 MG tablet Take 2 mg by mouth 2 (two) times daily.   Vitamin D, Ergocalciferol, (DRISDOL) 1.25 MG (50000 UNIT) CAPS capsule Take 50,000 Units by mouth once a week.     Allergies:   Codeine   Social History   Socioeconomic History   Marital status: Married    Spouse name: Not on file   Number of children: Not on file   Years of education: Not on file   Highest education level: Not on file  Occupational History   Not on file  Tobacco Use   Smoking status: Never   Smokeless tobacco: Never  Substance and Sexual Activity   Alcohol use: Not on file   Drug use: Not on file   Sexual activity: Not on file  Other Topics Concern   Not on file  Social History Narrative   Not on file   Social Determinants of  Health   Financial Resource Strain: Not on file  Food Insecurity: Not on file  Transportation Needs: Not on file  Physical Activity: Not on file  Stress: Not on file  Social Connections: Not on file     Family History: The patient's family history is not on file. ROS:   Please see the history of present illness.    All 14 point review of systems negative except as described per history of present illness  EKGs/Labs/Other Studies Reviewed:      Recent Labs: No results found for requested labs within last 8760 hours.  Recent Lipid Panel    Component Value  Date/Time   CHOL 138 09/23/2019 0247   TRIG 84 09/23/2019 0247   HDL 35 (L) 09/23/2019 0247   CHOLHDL 3.9 09/23/2019 0247   VLDL 17 09/23/2019 0247   LDLCALC 86 09/23/2019 0247    Physical Exam:    VS:  BP 110/70 (BP Location: Left Arm, Patient Position: Sitting, Cuff Size: Normal)   Pulse 78   Ht 5\' 7"  (1.702 m)   Wt 222 lb (100.7 kg)   SpO2 97%   BMI 34.77 kg/m     Wt Readings from Last 3 Encounters:  09/19/21 222 lb (100.7 kg)  03/31/21 229 lb (103.9 kg)  09/05/20 237 lb (107.5 kg)     GEN:  Well nourished, well developed in no acute distress HEENT: Normal NECK: No JVD; No carotid bruits LYMPHATICS: No lymphadenopathy CARDIAC: RRR, no murmurs, no rubs, no gallops RESPIRATORY:  Clear to auscultation without rales, wheezing or rhonchi  ABDOMEN: Soft, non-tender, non-distended MUSCULOSKELETAL:  No edema; No deformity  SKIN: Warm and dry LOWER EXTREMITIES: no swelling NEUROLOGIC:  Alert and oriented x 3 PSYCHIATRIC:  Normal affect   ASSESSMENT:    1. Atypical atrial flutter ([place])   2. Dilated cardiomyopathy ([place]) ejection fraction 35% in November 2020   3. Benign essential HTN   4. Schizophrenia, unspecified type ([place])    PLAN:    In order of problems listed above:  Atrial flutter maintaining sinus rhythm he does have right bundle branch block on EKG.  He does have some APCs.  He is anticoagulated because of CHA2DS2-VASc of being 2 which is essential hypertension as well as congestive heart failure.  We will continue for now however his left ventricle chest ejection fraction improved in the future we may revisit his need for anticoagulation. Dizziness upon getting up I suspect orthostatic hypotension.  On the physical exam he is euvolemic I will ask him to stop Lasix. High risk medication use he is on amiodarone 200 mg daily I will cut it down to only 1 tablet.  Later we will check his liver function test as well as thyroid. Benign essential hypertension,  interestingly his blood pressure today is very good he tells me that his nephrologist is very concerned about his high blood pressure.  He does have blood pressure monitor at home I asked him to check his blood pressure on the regular basis at home and then report to [place] that we will decide about management of this problem   Medication Adjustments/Labs and Tests Ordered: Current medicines are reviewed at length with the patient today.  Concerns regarding medicines are outlined above.  No orders of the defined types were placed in this encounter.  Medication changes: No orders of the defined types were placed in this encounter.   Signed, Park Liter, MD, [facility] 09/19/2021 9:37 AM    [facility]

## 2021-09-19 NOTE — ED Triage Notes (Signed)
Pt bib EMS from home for generalized weakness, especially in his legs. Right BBB noted on EMS EKG, cardiac hx of cardioversion for a fib RVR in November 2020. Pt states that he has not missed any doses of cardiac medications. Denies chest pain and nausea.   CBG 123 BP 130/70 HR 60  18G IV in R forearm, no meds given with EMS

## 2021-09-19 NOTE — Addendum Note (Signed)
Addended by: Resa Miner I on: 09/19/2021 09:43 AM   Modules accepted: Orders

## 2021-09-19 NOTE — ED Provider Notes (Signed)
[facility] EMERGENCY DEPARTMENT Provider Note   CSN: [id number] Arrival date & time: 09/19/21  2322     History Chief Complaint  Patient presents with   generalized weakness    Michael Mcknight is a 55 y.o. male.  The history is provided by the patient, the EMS personnel and medical records.  Michael Mcknight is a 55 y.o. male who presents to the Emergency Department complaining of near syncope. He presents the emergency department by EMS for evaluation following near syncopal event. He states that he has episodes of getting lightheaded intermittently when he goes from sitting to standing position. He had an episode today when he became lightheaded at the grocery store. Then this evening he got up from a seated position on the couch and was going to the bathroom and he fell to the floor. He did not injure himself and did not fully lose consciousness but did feel like he was about to blackout. He did have loss of bowel. He does report running stool today. No hematochezia or melena. Denies any recent illnesses. No chest pain, difficulty breathing, nausea, vomiting, abdominal pain. He does have a history of a fib, CKD, CHF. He is compliant with his home medications.    Past Medical History:  Diagnosis Date   Atrial fibrillation/flutter 09/22/2019   Atypical atrial flutter (HCC)    Benign essential HTN 09/22/2019   Cardiomegaly 09/22/2019   Cardiorenal syndrome with renal failure 6/57/8469   Chronic systolic congestive heart failure ([place]) 06/26/2020   CKD (chronic kidney disease) stage 4, GFR 15-29 ml/min ([place]) 06/26/2020   CKD (chronic kidney disease), stage III ([place]) 09/22/2019   Congestive heart failure due to cardiomyopathy [facility]) New York Heart Association class II 10/23/2019   Cyst of left kidney 10/04/2020   Dilated cardiomyopathy (HCC) ejection fraction 35% in November 2020 10/23/2019   Lobar pneumonia ([place]) 09/22/2019   Obesity (BMI 30-39.9) 06/26/2020   Pleural  effusion 09/22/2019   Schizophrenia ([place]) 09/22/2019    Patient Active Problem List   Diagnosis Date Noted   Acute kidney injury superimposed on CKD ([place]) 09/20/2021   Orthostatic hypotension 09/20/2021   Near syncope 09/20/2021   AF (paroxysmal atrial fibrillation) ([place]) 09/20/2021   Prolonged QT interval 09/20/2021   Cyst of left kidney 10/04/2020   Cardiorenal syndrome with renal failure 62/95/2841   Chronic systolic congestive heart failure ([place]) 06/26/2020   Obesity (BMI 30-39.9) 06/26/2020   CKD (chronic kidney disease) stage 4, GFR 15-29 ml/min (HCC) 06/26/2020   Dilated cardiomyopathy ([place]) ejection fraction 35% in November 2020 10/23/2019   Congestive heart failure due to cardiomyopathy [facility]) New York Heart Association class II 10/23/2019   Atypical atrial flutter ([place])    CKD (chronic kidney disease), stage III ([place]) 09/22/2019   Schizophrenia ([place]) 09/22/2019   Cardiomegaly 09/22/2019   Pleural effusion 09/22/2019   Lobar pneumonia ([place]) 09/22/2019   Benign essential HTN 09/22/2019   Atrial fibrillation/flutter 09/22/2019    Past Surgical History:  Procedure Laterality Date   CARDIOVERSION N/A 09/26/2019   Procedure: CARDIOVERSION;  Surgeon: Acie Fredrickson Wonda Cheng, MD;  Location: [place];  Service: Cardiovascular;  Laterality: N/A;   TEE WITHOUT CARDIOVERSION N/A 09/26/2019   Procedure: TRANSESOPHAGEAL ECHOCARDIOGRAM (TEE);  Surgeon: Acie Fredrickson Wonda Cheng, MD;  Location: [facility] ENDOSCOPY;  Service: Cardiovascular;  Laterality: N/A;       History reviewed. No pertinent family history.  Social History   Tobacco Use   Smoking status: Never   Smokeless tobacco: Never  Home Medications Prior to Admission medications   Medication Sig Start Date End Date Taking? Authorizing Provider  acetaminophen (TYLENOL) 500 MG tablet Take 1,000 mg by mouth every 6 (six) hours as needed for moderate pain or headache.   Yes [provider]  amiodarone (PACERONE) 200 MG tablet  Take 100 mg by mouth daily.   Yes [provider]  apixaban (ELIQUIS) 5 MG TABS tablet Take 1 tablet (5 mg total) by mouth 2 (two) times daily. 05/02/21  Yes Park Liter, MD  escitalopram (LEXAPRO) 10 MG tablet Take 10 mg by mouth daily. 02/07/20  Yes [provider]  risperiDONE (RISPERDAL) 1 MG tablet Take 1 mg by mouth daily. 08/21/20  Yes [provider]  risperidone (RISPERDAL) 4 MG tablet Take 4 mg by mouth at bedtime.   Yes [provider]  trihexyphenidyl (ARTANE) 2 MG tablet Take 2 mg by mouth 2 (two) times daily. 02/12/20  Yes [provider]  Vitamin D, Ergocalciferol, (DRISDOL) 1.25 MG (50000 UNIT) CAPS capsule Take 50,000 Units by mouth every Sunday. 01/28/21  Yes [provider]    Allergies    Codeine  Review of Systems   Review of Systems  All other systems reviewed and are negative.  Physical Exam Updated Vital Signs BP (!) 116/98   Pulse 62   Temp 97.8 F (36.6 C) (Oral)   Resp (!) 21   Ht 5\' 6"  (1.676 m)   Wt 100.7 kg   SpO2 100%   BMI 35.83 kg/m   Physical Exam Vitals and nursing note reviewed.  Constitutional:      Appearance: He is well-developed.  HENT:     Head: Normocephalic and atraumatic.  Cardiovascular:     Rate and Rhythm: Normal rate and regular rhythm.     Heart sounds: No murmur heard. Pulmonary:     Effort: Pulmonary effort is normal. No respiratory distress.     Breath sounds: Normal breath sounds.  Abdominal:     Palpations: Abdomen is soft.     Tenderness: There is no abdominal tenderness. There is no guarding or rebound.  Musculoskeletal:        General: No swelling or tenderness.  Skin:    General: Skin is warm and dry.  Neurological:     Mental Status: He is alert and oriented to person, place, and time.  Psychiatric:        Behavior: Behavior normal.    ED Results / Procedures / Treatments   Labs (all labs ordered are listed, but only abnormal results are  displayed) Labs Reviewed  COMPREHENSIVE METABOLIC PANEL - Abnormal; Notable for the following components:      Result Value   Sodium 132 (*)    CO2 18 (*)    Glucose, Bld 118 (*)    BUN 60 (*)    Creatinine, Ser 4.94 (*)    Calcium 8.7 (*)    AST 63 (*)    GFR, Estimated 13 (*)    All other components within normal limits  CBC WITH DIFFERENTIAL/PLATELET - Abnormal; Notable for the following components:   RBC 4.21 (*)    Hemoglobin 12.0 (*)    HCT 36.7 (*)    Monocytes Absolute 1.3 (*)    All other components within normal limits  URINALYSIS, ROUTINE W REFLEX MICROSCOPIC - Abnormal; Notable for the following components:   Hgb urine dipstick SMALL (*)    All other components within normal limits  RESP PANEL BY RT-PCR (FLU  A&B, COVID) ARPGX2  BRAIN NATRIURETIC PEPTIDE  HIV ANTIBODY (ROUTINE TESTING W REFLEX)  TROPONIN I (HIGH SENSITIVITY)  TROPONIN I (HIGH SENSITIVITY)    EKG EKG Interpretation  Date/Time:  Saturday September 20 2021 01:52:33 EDT Ventricular Rate:  75 PR Interval:  145 QRS Duration: 180 QT Interval:  446 QTC Calculation: 499 R Axis:   92 Text Interpretation: Sinus rhythm Atrial premature complexes RBBB and LPFB Confirmed by Quintella Reichert [phone]) on 09/20/2021 3:27:42 AM  Radiology DG Chest Port 1 View  Result Date: 09/20/2021 CLINICAL DATA:  Generalized weakness. EXAM: PORTABLE CHEST 1 VIEW COMPARISON:  September 21, 2019 FINDINGS: Mildly decreased lung volumes are seen with multiple overlying radiopaque cardiac lead wires present. There is no evidence of acute infiltrate, pleural effusion or pneumothorax. The cardiac silhouette is mildly enlarged. The visualized skeletal structures are unremarkable. IMPRESSION: Mildly decreased lung volumes without evidence of acute or active cardiopulmonary disease. Electronically Signed   By: Virgina Norfolk M.D.   On: 09/20/2021 00:14    Procedures Procedures   Medications Ordered in ED Medications  amiodarone  (PACERONE) tablet 100 mg (has no administration in time range)  escitalopram (LEXAPRO) tablet 10 mg (has no administration in time range)  risperiDONE (RISPERDAL) tablet 1 mg (has no administration in time range)  risperiDONE (RISPERDAL) tablet 4 mg (has no administration in time range)  apixaban (ELIQUIS) tablet 5 mg (has no administration in time range)  lactated ringers infusion (has no administration in time range)  acetaminophen (TYLENOL) tablet 650 mg (has no administration in time range)    Or  acetaminophen (TYLENOL) suppository 650 mg (has no administration in time range)  sodium chloride 0.9 % bolus 250 mL (0 mLs Intravenous Stopped 09/20/21 0405)  lactated ringers bolus 500 mL (500 mLs Intravenous New Bag/Given 09/20/21 0731)    ED Course  I have reviewed the triage vital signs and the nursing notes.  Pertinent labs & imaging results that were available during my care of the patient were reviewed by me and considered in my medical decision making (see chart for details).    MDM Rules/Calculators/A&P                          patient with history of CHF, CKD here for evaluation following near syncopal episode. On evaluation patient is non-toxic appearing and well perfused. He is significantly orthostatic on ED presentation. He was treated with IV fluid hydration. On repeat assessment after IV fluids he remains orthostatic. Labs significant for AK I would compared to priors in care everywhere in late September with a doubling of his creatinine from 2 to 4. Given his underlying CHF and CKD recommend observation admission for IV fluids and to monitor response in setting of multiple near syncopal events. Medicine consulted for admission. Patient is in agreement with admission.  Final Clinical Impression(s) / ED Diagnoses Final diagnoses:  AKI (acute kidney injury) ([place])  Orthostatic hypotension    Rx / DC Orders ED Discharge Orders     None        Quintella Reichert,  MD 09/20/21 [phone]

## 2021-09-20 ENCOUNTER — Encounter (HOSPITAL_COMMUNITY): Payer: Self-pay | Admitting: Family Medicine

## 2021-09-20 ENCOUNTER — Emergency Department (HOSPITAL_COMMUNITY): Payer: PPO

## 2021-09-20 DIAGNOSIS — G909 Disorder of the autonomic nervous system, unspecified: Secondary | ICD-10-CM | POA: Diagnosis not present

## 2021-09-20 DIAGNOSIS — F209 Schizophrenia, unspecified: Secondary | ICD-10-CM

## 2021-09-20 DIAGNOSIS — Z79899 Other long term (current) drug therapy: Secondary | ICD-10-CM | POA: Diagnosis not present

## 2021-09-20 DIAGNOSIS — I480 Paroxysmal atrial fibrillation: Secondary | ICD-10-CM | POA: Diagnosis not present

## 2021-09-20 DIAGNOSIS — E861 Hypovolemia: Secondary | ICD-10-CM | POA: Diagnosis not present

## 2021-09-20 DIAGNOSIS — I4510 Unspecified right bundle-branch block: Secondary | ICD-10-CM | POA: Diagnosis not present

## 2021-09-20 DIAGNOSIS — Z885 Allergy status to narcotic agent status: Secondary | ICD-10-CM | POA: Diagnosis not present

## 2021-09-20 DIAGNOSIS — N189 Chronic kidney disease, unspecified: Secondary | ICD-10-CM | POA: Diagnosis not present

## 2021-09-20 DIAGNOSIS — I5022 Chronic systolic (congestive) heart failure: Secondary | ICD-10-CM

## 2021-09-20 DIAGNOSIS — N179 Acute kidney failure, unspecified: Secondary | ICD-10-CM | POA: Diagnosis present

## 2021-09-20 DIAGNOSIS — E872 Acidosis, unspecified: Secondary | ICD-10-CM | POA: Diagnosis not present

## 2021-09-20 DIAGNOSIS — R197 Diarrhea, unspecified: Secondary | ICD-10-CM | POA: Diagnosis not present

## 2021-09-20 DIAGNOSIS — R531 Weakness: Secondary | ICD-10-CM | POA: Diagnosis not present

## 2021-09-20 DIAGNOSIS — N184 Chronic kidney disease, stage 4 (severe): Secondary | ICD-10-CM | POA: Diagnosis not present

## 2021-09-20 DIAGNOSIS — R55 Syncope and collapse: Secondary | ICD-10-CM | POA: Diagnosis not present

## 2021-09-20 DIAGNOSIS — I951 Orthostatic hypotension: Secondary | ICD-10-CM

## 2021-09-20 DIAGNOSIS — I5042 Chronic combined systolic (congestive) and diastolic (congestive) heart failure: Secondary | ICD-10-CM | POA: Diagnosis not present

## 2021-09-20 DIAGNOSIS — Z20822 Contact with and (suspected) exposure to covid-19: Secondary | ICD-10-CM | POA: Diagnosis not present

## 2021-09-20 DIAGNOSIS — E871 Hypo-osmolality and hyponatremia: Secondary | ICD-10-CM | POA: Diagnosis not present

## 2021-09-20 DIAGNOSIS — I484 Atypical atrial flutter: Secondary | ICD-10-CM | POA: Diagnosis not present

## 2021-09-20 DIAGNOSIS — Z7901 Long term (current) use of anticoagulants: Secondary | ICD-10-CM | POA: Diagnosis not present

## 2021-09-20 DIAGNOSIS — I130 Hypertensive heart and chronic kidney disease with heart failure and stage 1 through stage 4 chronic kidney disease, or unspecified chronic kidney disease: Secondary | ICD-10-CM | POA: Diagnosis not present

## 2021-09-20 DIAGNOSIS — R9431 Abnormal electrocardiogram [ECG] [EKG]: Secondary | ICD-10-CM

## 2021-09-20 DIAGNOSIS — I420 Dilated cardiomyopathy: Secondary | ICD-10-CM | POA: Diagnosis not present

## 2021-09-20 HISTORY — DX: Orthostatic hypotension: I95.1

## 2021-09-20 HISTORY — DX: Abnormal electrocardiogram (ECG) (EKG): R94.31

## 2021-09-20 HISTORY — DX: Syncope and collapse: R55

## 2021-09-20 HISTORY — DX: Paroxysmal atrial fibrillation: I48.0

## 2021-09-20 HISTORY — DX: Acute kidney failure, unspecified: N17.9

## 2021-09-20 LAB — URINALYSIS, ROUTINE W REFLEX MICROSCOPIC
Bacteria, UA: NONE SEEN
Bilirubin Urine: NEGATIVE
Glucose, UA: NEGATIVE mg/dL
Ketones, ur: NEGATIVE mg/dL
Leukocytes,Ua: NEGATIVE
Nitrite: NEGATIVE
Protein, ur: NEGATIVE mg/dL
Specific Gravity, Urine: 1.018 (ref 1.005–1.030)
pH: 5 (ref 5.0–8.0)

## 2021-09-20 LAB — TROPONIN I (HIGH SENSITIVITY)
Troponin I (High Sensitivity): 14 ng/L (ref <18)
Troponin I (High Sensitivity): 14 ng/L (ref <18)

## 2021-09-20 LAB — COMPREHENSIVE METABOLIC PANEL
ALT: 41 U/L (ref 0–44)
AST: 63 U/L — ABNORMAL HIGH (ref 15–41)
Albumin: 3.9 g/dL (ref 3.5–5.0)
Alkaline Phosphatase: 75 U/L (ref 38–126)
Anion gap: 10 (ref 5–15)
BUN: 60 mg/dL — ABNORMAL HIGH (ref 6–20)
CO2: 18 mmol/L — ABNORMAL LOW (ref 22–32)
Calcium: 8.7 mg/dL — ABNORMAL LOW (ref 8.9–10.3)
Chloride: 104 mmol/L (ref 98–111)
Creatinine, Ser: 4.94 mg/dL — ABNORMAL HIGH (ref 0.61–1.24)
GFR, Estimated: 13 mL/min — ABNORMAL LOW (ref >60)
Glucose, Bld: 118 mg/dL — ABNORMAL HIGH (ref 70–99)
Potassium: 4.2 mmol/L (ref 3.5–5.1)
Sodium: 132 mmol/L — ABNORMAL LOW (ref 135–145)
Total Bilirubin: 0.7 mg/dL (ref 0.3–1.2)
Total Protein: 7.3 g/dL (ref 6.5–8.1)

## 2021-09-20 LAB — BRAIN NATRIURETIC PEPTIDE: B Natriuretic Peptide: 9.4 pg/mL (ref 0.0–100.0)

## 2021-09-20 LAB — RESP PANEL BY RT-PCR (FLU A&B, COVID) ARPGX2
Influenza A by PCR: NEGATIVE
Influenza B by PCR: NEGATIVE
SARS Coronavirus 2 by RT PCR: NEGATIVE

## 2021-09-20 LAB — HIV ANTIBODY (ROUTINE TESTING W REFLEX): HIV Screen 4th Generation wRfx: NONREACTIVE

## 2021-09-20 MED ORDER — RISPERIDONE 1 MG PO TABS
4.0000 mg | ORAL_TABLET | Freq: Every day | ORAL | Status: DC
  Administered 2021-09-21 (×2): 4 mg via ORAL
  Filled 2021-09-20 (×2): qty 4

## 2021-09-20 MED ORDER — RISPERIDONE 1 MG PO TABS
1.0000 mg | ORAL_TABLET | Freq: Every day | ORAL | Status: DC
  Administered 2021-09-20 – 2021-09-22 (×3): 1 mg via ORAL
  Filled 2021-09-20 (×4): qty 1

## 2021-09-20 MED ORDER — ACETAMINOPHEN 325 MG PO TABS
650.0000 mg | ORAL_TABLET | Freq: Four times a day (QID) | ORAL | Status: DC | PRN
  Administered 2021-09-21 – 2021-09-22 (×2): 650 mg via ORAL
  Filled 2021-09-20 (×2): qty 2

## 2021-09-20 MED ORDER — AMIODARONE HCL 100 MG PO TABS
100.0000 mg | ORAL_TABLET | Freq: Every day | ORAL | Status: DC
  Administered 2021-09-20 – 2021-09-22 (×3): 100 mg via ORAL
  Filled 2021-09-20 (×3): qty 1

## 2021-09-20 MED ORDER — SODIUM CHLORIDE 0.9 % IV BOLUS
250.0000 mL | Freq: Once | INTRAVENOUS | Status: AC
  Administered 2021-09-20: 250 mL via INTRAVENOUS

## 2021-09-20 MED ORDER — LACTATED RINGERS IV BOLUS
500.0000 mL | Freq: Once | INTRAVENOUS | Status: AC
  Administered 2021-09-20: 500 mL via INTRAVENOUS

## 2021-09-20 MED ORDER — APIXABAN 5 MG PO TABS
5.0000 mg | ORAL_TABLET | Freq: Two times a day (BID) | ORAL | Status: DC
  Administered 2021-09-20 – 2021-09-22 (×5): 5 mg via ORAL
  Filled 2021-09-20 (×5): qty 1

## 2021-09-20 MED ORDER — LACTATED RINGERS IV SOLN: INTRAVENOUS | Status: DC

## 2021-09-20 MED ORDER — ESCITALOPRAM OXALATE 10 MG PO TABS
10.0000 mg | ORAL_TABLET | Freq: Every day | ORAL | Status: DC
  Administered 2021-09-20 – 2021-09-22 (×3): 10 mg via ORAL
  Filled 2021-09-20 (×3): qty 1

## 2021-09-20 MED ORDER — STERILE WATER FOR INJECTION IV SOLN
INTRAVENOUS | Status: DC
  Filled 2021-09-20 (×2): qty 1000

## 2021-09-20 MED ORDER — ACETAMINOPHEN 650 MG RE SUPP: 650.0000 mg | Freq: Four times a day (QID) | RECTAL | Status: DC | PRN

## 2021-09-20 NOTE — ED Notes (Signed)
RN made Dr. Ralene Bathe aware of pt BP decreases seen throughout orthostatic vital sign measurement.

## 2021-09-20 NOTE — Progress Notes (Signed)
PROGRESS NOTE    Michael Mcknight  BCW:[id number] DOB: Mar 08, 1966 DOA: 09/19/2021 PCP: Leilani Able, FNP    Brief Narrative:  Michael Mcknight was admitted to the hospital with the working diagnosis of AKI on CKD  55 year old male past medical history for chronic kidney disease, paroxysmal atrial fibrillation, diastolic heart failure, hypertension and schizophrenia who presented with near syncope episode, and orthostatic symptoms.  Patient was his normal state of health, he was sitting on a couch at his home for a while after being in a grocery store shopping.  He stood up to go to the bathroom and felt lightheaded and weak, collapsing to the floor no loss of consciousness or tonic-clonic activity.  In the emergency department he continued to have orthostatic hypotension and was referred for further evaluation.  His blood pressure was 116/72, heart rate 66, respirate 15, oxygen saturation 99%, his lungs are clear to auscultation bilaterally, heart S1-S2, present, rhythmic, soft abdomen, no lower extremity edema.  Sodium 132, potassium 4.2, chloride 1 4, bicarb 18, glucose 118, BUN 60, creatinine 4.94, white count 9.3, hemoglobin 12.0, hematocrit 36.7, platelets 309. SARS COVID-19 negative.  Urine analysis specific gravity 1.018, negative protein negative nitrates.  EKG 67 bpm, normal axis, QTC 520, right bundle branch block, sinus rhythm, no significant ST segment or T wave changes.  Assessment & Plan:   Principal Problem:   Acute kidney injury superimposed on CKD ([name]) Active Problems:   Schizophrenia ([name])   Chronic systolic congestive heart failure (HCC)   Orthostatic hypotension   Near syncope   AF (paroxysmal atrial fibrillation) (HCC)   Prolonged QT interval   AKI (acute kidney injury) ([place])   AKI on CKD stage IV. Hyponatremia and non gap metabolic acidosis/ hypovolemic Likely hypovolemic related to diarrhea.  Patient continue to have orthostatic hypotension this am.    Plan to continue hydration with isotonic fluids with bicarb drip at 100 ml per Hr and will follow up renal function and electrolytes in am. Avoid hypotension and nephrotoxic medications.   2. Paroxysmal atrial fibrillation prolonged Qtc. Patient sinus rhythm, continue with amiodarone per outpatient regimen.  Anticoagulation with apixaban.   3. Chronic congested heart failure. No signs of acute exacerbation, will continue close blood pressure and volume monitoring.   4. Schizophrenia. Continue with escitalopram, and risperidone.    Patient continue to be at high risk for worsening renal failure   Status is: Inpatient  Remains inpatient appropriate because: IV fluids and renal function monitoring   DVT prophylaxis: Heparin   Code Status:    full  Family Communication:   No family at the bedside       Subjective: Patient with no nausea or vomiting, no dyspnea or chest pain, no lower extremity edema, he is feeling better but not yet back to baseline.   Objective: Vitals:   09/20/21 0830 09/20/21 0845 09/20/21 0914 09/20/21 1121  BP: (!) 138/95 127/69 114/75 139/84  Pulse: 67 66 100 62  Resp: 15 20 18 18   Temp:   98 F (36.7 C) 98 F (36.7 C)  TempSrc:   Oral Oral  SpO2: 100% 100% 94% 100%  Weight:   102.1 kg   Height:   5\' 6"  (1.676 m)     Intake/Output Summary (Last 24 hours) at 09/20/2021 1537 Last data filed at 09/20/2021 0900 Gross per 24 hour  Intake 250 ml  Output 725 ml  Net -475 ml   Filed Weights   09/19/21 2327 09/20/21 0914  Weight:  100.7 kg 102.1 kg    Examination:   General: Not in pain or dyspnea.  Neurology: Awake and alert, non focal  E ENT: mild pallor, no icterus, oral mucosa moist Cardiovascular: No JVD. S1-S2 present, rhythmic, no gallops, rubs, or murmurs. No lower extremity edema. Pulmonary: positive breath sounds bilaterally, adequate air movement, no wheezing, rhonchi or rales. Gastrointestinal. Abdomen soft and non tender Skin. No  rashes Musculoskeletal: no joint deformities     Data Reviewed: I have personally reviewed following labs and imaging studies  CBC: Recent Labs  Lab 09/19/21 2334  WBC 9.3  NEUTROABS 7.3  HGB 12.0*  HCT 36.7*  MCV 87.2  PLT 381   Basic Metabolic Panel: Recent Labs  Lab 09/19/21 2334  NA 132*  K 4.2  CL 104  CO2 18*  GLUCOSE 118*  BUN 60*  CREATININE 4.94*  CALCIUM 8.7*   GFR: Estimated Creatinine Clearance: 19.1 mL/min (A) (by C-G formula based on SCr of 4.94 mg/dL (H)). Liver Function Tests: Recent Labs  Lab 09/19/21 2334  AST 63*  ALT 41  ALKPHOS 75  BILITOT 0.7  PROT 7.3  ALBUMIN 3.9   No results for input(s): LIPASE, AMYLASE in the last 168 hours. No results for input(s): AMMONIA in the last 168 hours. Coagulation Profile: No results for input(s): INR, PROTIME in the last 168 hours. Cardiac Enzymes: No results for input(s): CKTOTAL, CKMB, CKMBINDEX, TROPONINI in the last 168 hours. BNP (last 3 results) No results for input(s): PROBNP in the last 8760 hours. HbA1C: No results for input(s): HGBA1C in the last 72 hours. CBG: No results for input(s): GLUCAP in the last 168 hours. Lipid Profile: No results for input(s): CHOL, HDL, LDLCALC, TRIG, CHOLHDL, LDLDIRECT in the last 72 hours. Thyroid Function Tests: No results for input(s): TSH, T4TOTAL, FREET4, T3FREE, THYROIDAB in the last 72 hours. Anemia Panel: No results for input(s): VITAMINB12, FOLATE, FERRITIN, TIBC, IRON, RETICCTPCT in the last 72 hours.    Radiology Studies: I have reviewed all of the imaging during this hospital visit personally     Scheduled Meds:  amiodarone  100 mg Oral Daily   apixaban  5 mg Oral BID   escitalopram  10 mg Oral Daily   risperiDONE  1 mg Oral Daily   risperidone  4 mg Oral QHS   Continuous Infusions:  lactated ringers 50 mL/hr at 09/20/21 1443     LOS: 0 days        Quantia Grullon Gerome Apley, MD

## 2021-09-20 NOTE — ED Notes (Signed)
[phone] Nira Conn, PT NIECE PHONE NUMBER

## 2021-09-20 NOTE — ED Notes (Signed)
This RN called and spoke with Nira Conn, pt's niece, for update on pt and answered all of her questions.

## 2021-09-20 NOTE — H&P (Signed)
History and Physical    Michael Mcknight EGB:[id number] DOB: 06-11-1966 DOA: 09/19/2021  PCP: Leilani Able, FNP   Michael Mcknight coming from: Home  Chief Complaint: Near syncope, lightheaded when stands up  HPI: Michael Mcknight is a 55 y.o. male with medical history significant for CKD, PAF, HFrEF, HTN, schizophrenia who presents by EMS for evaluation of near syncope.  Said Michael Mcknight went to [employer] yesterday afternoon to do some shopping and Michael Mcknight felt fine at that time.  Michael Mcknight went home and after sitting on the couch for a while Michael Mcknight got up to go the bathroom when Michael Mcknight felt very lightheaded and weak and went down to the floor.  Michael Mcknight did not have any loss of consciousness.  Michael Mcknight had no tremors or seizure-like activity.  Michael Mcknight reports Michael Mcknight did have a bout of diarrhea that was nonbloody but Michael Mcknight had no abdominal pain or distention.  Michael Mcknight reports Michael Mcknight has not had any fevers or chills.  Michael Mcknight did not have any chest pain or palpitations with the episode.  Michael Mcknight denies having any numbness in his extremities and Michael Mcknight does not have any slurred speech, drooping face or blurry vision.  In the emergency room Michael Mcknight was stood up Michael Mcknight again felt lightheaded and woozy.  Orthostatic blood pressures were obtained and were positive.  Systolic blood pressure was in the 120 range laying down but dropped to 85  when Michael Mcknight stood up.  Michael Mcknight reports Michael Mcknight has not had any urinary hesitancy or frequency and has not had any dysuria.  Michael Mcknight does not take any medications for symptoms at home.  Michael Mcknight does take Artane at home which does have a rare side effect of causing orthostatic hypotension.  ED Course: In the emergency room Michael Mcknight was found to have an elevated creatinine of 4.94 today.  Cording to records from [facility] at the end of September of this year his creatinine was around 2.  Michael Mcknight has a negative BNP and negative troponins in the emergency room.  BNP was 9.4 and troponins were 14 on 2 occasions.  Michael Mcknight denies any chest pain and Michael Mcknight has no acute ischemic EKG changes.  Sodium  is 132 potassium 4.2 chloride 104 bicarb 18 creatinine 4.94 BUN 60 alkaline phosphatase 75 AST 63 ALT 41.  CBC is unremarkable.  Hospitalist service was asked to evaluate and manage Michael Mcknight with his orthostatic hypotension.  Michael Mcknight was given a small volume of fluid bolus in the emergency room at 250 mL of normal saline  Review of Systems:  General: Reports weakness and lightheaded when stands up. Denies fever, chills, weight loss, night sweats. Denies change in appetite HENT: Denies head trauma, headache, denies change in hearing, tinnitus.  Denies nasal congestion or bleeding.  Denies sore throat, sores in mouth.  Denies difficulty swallowing Eyes: Denies blurry vision, pain in eye, drainage.  Denies discoloration of eyes. Neck: Denies pain.  Denies swelling.  Denies pain with movement. Cardiovascular: Denies chest pain, palpitations.  Denies edema.  Denies orthopnea Respiratory: Denies shortness of breath, cough.  Denies wheezing.  Denies sputum production Gastrointestinal: Denies abdominal pain, swelling.  Denies nausea, vomiting.  Denies melena.  Denies hematemesis. Musculoskeletal: Denies limitation of movement.  Denies deformity or swelling. Denies arthralgias or myalgias. Genitourinary: Denies pelvic pain.  Denies urinary frequency or hesitancy.  Denies dysuria.  Skin: Denies rash.  Denies petechiae, purpura, ecchymosis. Neurological: Denies syncope.  Denies seizure activity. Denies paresthesia. Denies slurred speech, drooping face.  Denies visual change. Psychiatric: Denies depression, anxiety.  Denies hallucinations.  Past Medical History:  Diagnosis Date   Atrial fibrillation/flutter 09/22/2019   Atypical atrial flutter (HCC)    Benign essential HTN 09/22/2019   Cardiomegaly 09/22/2019   Cardiorenal syndrome with renal failure 02/22/8118   Chronic systolic congestive heart failure ([place]) 06/26/2020   CKD (chronic kidney disease) stage 4, GFR 15-29 ml/min ([place]) 06/26/2020   CKD (chronic kidney  disease), stage III ([place]) 09/22/2019   Congestive heart failure due to cardiomyopathy [facility]) New York Heart Association class II 10/23/2019   Cyst of left kidney 10/04/2020   Dilated cardiomyopathy (HCC) ejection fraction 35% in November 2020 10/23/2019   Lobar pneumonia ([place]) 09/22/2019   Obesity (BMI 30-39.9) 06/26/2020   Pleural effusion 09/22/2019   Schizophrenia ([place]) 09/22/2019    Past Surgical History:  Procedure Laterality Date   CARDIOVERSION N/A 09/26/2019   Procedure: CARDIOVERSION;  Surgeon: Nahser, Wonda Cheng, MD;  Location: [facility];  Service: Cardiovascular;  Laterality: N/A;   TEE WITHOUT CARDIOVERSION N/A 09/26/2019   Procedure: TRANSESOPHAGEAL ECHOCARDIOGRAM (TEE);  Surgeon: Acie Fredrickson Wonda Cheng, MD;  Location: [facility] ENDOSCOPY;  Service: Cardiovascular;  Laterality: N/A;    Social History  reports that Michael Mcknight has never smoked. Michael Mcknight has never used smokeless tobacco. No history on file for alcohol use and drug use.  Allergies  Allergen Reactions   Codeine Nausea And Vomiting    History reviewed. No pertinent family history.   Prior to Admission medications   Medication Sig Start Date End Date Taking? Authorizing Provider  acetaminophen (TYLENOL) 500 MG tablet Take 1,000 mg by mouth every 6 (six) hours as needed for moderate pain or headache.   Yes [provider]  amiodarone (PACERONE) 200 MG tablet Take 100 mg by mouth daily.   Yes [provider]  apixaban (ELIQUIS) 5 MG TABS tablet Take 1 tablet (5 mg total) by mouth 2 (two) times daily. 05/02/21  Yes Park Liter, MD  escitalopram (LEXAPRO) 10 MG tablet Take 10 mg by mouth daily. 02/07/20  Yes [provider]  risperiDONE (RISPERDAL) 1 MG tablet Take 1 mg by mouth daily. 08/21/20  Yes [provider]  risperidone (RISPERDAL) 4 MG tablet Take 4 mg by mouth at bedtime.   Yes [provider]  trihexyphenidyl (ARTANE) 2 MG tablet Take 2 mg by mouth 2 (two) times daily. 02/12/20  Yes  [provider]  Vitamin D, Ergocalciferol, (DRISDOL) 1.25 MG (50000 UNIT) CAPS capsule Take 50,000 Units by mouth every Sunday. 01/28/21  Yes [provider]    Physical Exam: Vitals:   09/20/21 0045 09/20/21 0145 09/20/21 0245 09/20/21 0400  BP: 116/72 122/76 120/73 120/74  Pulse: 66 71 66 65  Resp: 15 16 15 15   Temp:      TempSrc:      SpO2: 100% 100% 99% 99%  Weight:      Height:        Constitutional: NAD, calm, comfortable Vitals:   09/20/21 0045 09/20/21 0145 09/20/21 0245 09/20/21 0400  BP: 116/72 122/76 120/73 120/74  Pulse: 66 71 66 65  Resp: 15 16 15 15   Temp:      TempSrc:      SpO2: 100% 100% 99% 99%  Weight:      Height:       General: WDWN, Alert and oriented x3.  Eyes: EOMI, PERRL, conjunctivae normal.  Sclera nonicteric.  No nystagmus HENT:  [place]/AT, external ears normal.  Nares patent without epistasis.  Mucous membranes are dry. Posterior pharynx clear  Neck: Soft, normal range of motion, supple, no masses, no thyromegaly.  Trachea midline Respiratory: clear to auscultation bilaterally, no wheezing, no crackles. Normal respiratory effort. No accessory muscle use.  Cardiovascular: Regular rate and rhythm, no murmurs / rubs / gallops. No extremity edema. 2+ pedal pulses.  Abdomen: Soft, no tenderness, nondistended, no rebound or guarding.  No masses palpated. Obese. Bowel sounds normoactive Musculoskeletal: FROM. no cyanosis. No joint deformity upper and lower extremities. Normal muscle tone.  Skin: Warm, dry, intact no rashes, lesions, ulcers. No induration Neurologic: CN 2-12 grossly intact.  Normal speech.  Sensation intact to touch. Strength 5/5 in all extremities.   Psychiatric:   Normal mood.    Labs on Admission: I have personally reviewed following labs and imaging studies  CBC: Recent Labs  Lab 09/19/21 2334  WBC 9.3  NEUTROABS 7.3  HGB 12.0*  HCT 36.7*  MCV 87.2  PLT 782    Basic Metabolic Panel: Recent Labs  Lab  09/19/21 2334  NA 132*  K 4.2  CL 104  CO2 18*  GLUCOSE 118*  BUN 60*  CREATININE 4.94*  CALCIUM 8.7*    GFR: Estimated Creatinine Clearance: 19 mL/min (A) (by C-G formula based on SCr of 4.94 mg/dL (H)).  Liver Function Tests: Recent Labs  Lab 09/19/21 2334  AST 63*  ALT 41  ALKPHOS 75  BILITOT 0.7  PROT 7.3  ALBUMIN 3.9    Urine analysis:    Component Value Date/Time   COLORURINE RED (A) 09/23/2019 1827   APPEARANCEUR CLOUDY (A) 09/23/2019 1827   LABSPEC 1.025 09/23/2019 1827   PHURINE 5.5 09/23/2019 1827   GLUCOSEU NEGATIVE 09/23/2019 1827   HGBUR LARGE (A) 09/23/2019 1827   BILIRUBINUR SMALL (A) 09/23/2019 1827   KETONESUR NEGATIVE 09/23/2019 1827   PROTEINUR 30 (A) 09/23/2019 1827   NITRITE POSITIVE (A) 09/23/2019 1827   LEUKOCYTESUR TRACE (A) 09/23/2019 1827    Radiological Exams on Admission: DG Chest Port 1 View  Result Date: 09/20/2021 CLINICAL DATA:  Generalized weakness. EXAM: PORTABLE CHEST 1 VIEW COMPARISON:  September 21, 2019 FINDINGS: Mildly decreased lung volumes are seen with multiple overlying radiopaque cardiac lead wires present. There is no evidence of acute infiltrate, pleural effusion or pneumothorax. The cardiac silhouette is mildly enlarged. The visualized skeletal structures are unremarkable. IMPRESSION: Mildly decreased lung volumes without evidence of acute or active cardiopulmonary disease. Electronically Signed   By: Virgina Norfolk M.D.   On: 09/20/2021 00:14    EKG: Independently reviewed.  EKG shows normal sinus rhythm with occasional PACs.  Right bundle branch block and left posterior fascicular block noted.  No acute ST elevation or depression.  QTc prolonged at 499  Assessment/Plan Principal Problem:   Acute kidney injury superimposed on CKD  Michael Mcknight is placed on medical telemetry for observation.  Michael Mcknight has increase in his creatinine from a level of 2 at the end of September at [facility] per records to over 4 today.  Given small bolus of LR and will provide gentle IVF hydration with LR at 50 ml/hr after the bolus Recheck renal function and electrolytes in am.   Active Problems:   Orthostatic hypotension Michael Mcknight with orthostatic hypotension on testing in the emergency room.  Systolic blood pressure went from 120 while lying down to 88 when standing. Michael Mcknight has been taking Artane which does have a rare side effect causing orthostatic hypotension therefore Artane is held    Near syncope Secondary to orthostatic hypotension.  Occurred when Michael Mcknight  stood up.  Did not have any loss of consciousness. Placed on fall precautions    Chronic systolic congestive heart failure Michael Mcknight had echo in June of this year which showed an EF of 30%. Michael Mcknight will need optimization of his medications for heart failure as Michael Mcknight is currently not taking any    AF (paroxysmal atrial fibrillation)  On amiodarone and Eliquis which will be continued    Prolonged QT interval Avoid medications which could further prolong QT interval.  Monitor on telemetry    Schizophrenia Michael Mcknight is on Risperdal which will be continued   DVT prophylaxis: Pt is on Eliquis for anticoagulation which is continued  Code Status:   Full Code  Family Communication:  Diagnosis and plan discussed with Michael Mcknight.  Michael Mcknight verbalizes agreement with plan.  Further recommendations to follow as clinical indicated Disposition Plan:   Michael Mcknight is from:  Home  Anticipated DC to:  Home  Anticipated DC date:  Anticipate less than 2 midnight stay   Admission status:  Observation   Yevonne Aline Taryne Kiger MD [facility]  How to contact the [facility] Attending or Consulting provider [place] or covering provider during after hours [place], for this Michael Mcknight?   Check the care team in [facility] and look for a) attending/consulting [facility] provider listed and b) the [facility] team listed Log into [email or web address] and use [place]'s universal password to access. If you do not have the password, please  contact the hospital operator. Locate the [facility] provider you are looking for under [facility] and page to a number that you can be directly reached. If you still have difficulty reaching the provider, please page the [facility] (Director on Call) for the Hospitalists listed on amion for assistance.  09/20/2021, 5:11 AM

## 2021-09-20 NOTE — Progress Notes (Signed)
Mobility Specialist Progress Note:   09/20/21 1516  Mobility  Activity Ambulated in room  Level of Assistance Standby assist, set-up cues, supervision of patient - no hands on  Assistive Device Front wheel walker  Distance Ambulated (ft) 40 ft  Mobility Ambulated with assistance in room  Mobility Response Tolerated well  Mobility performed by Mobility specialist  $Mobility charge 1 Mobility   Pt received in bed willing to participate in mobility. L foot pain limited mobility. Pt returned to bed with call bell in reach and all needs met.   [facility] Health and safety inspector Phone [phone]

## 2021-09-21 LAB — BASIC METABOLIC PANEL
Anion gap: 6 (ref 5–15)
BUN: 36 mg/dL — ABNORMAL HIGH (ref 6–20)
CO2: 25 mmol/L (ref 22–32)
Calcium: 8.4 mg/dL — ABNORMAL LOW (ref 8.9–10.3)
Chloride: 104 mmol/L (ref 98–111)
Creatinine, Ser: 3.06 mg/dL — ABNORMAL HIGH (ref 0.61–1.24)
GFR, Estimated: 23 mL/min — ABNORMAL LOW (ref >60)
Glucose, Bld: 110 mg/dL — ABNORMAL HIGH (ref 70–99)
Potassium: 4.1 mmol/L (ref 3.5–5.1)
Sodium: 135 mmol/L (ref 135–145)

## 2021-09-21 MED ORDER — INFLUENZA VAC SPLIT QUAD 0.5 ML IM SUSY: 0.5000 mL | PREFILLED_SYRINGE | INTRAMUSCULAR | Status: DC

## 2021-09-21 MED ORDER — SODIUM CHLORIDE 0.45 % IV SOLN: INTRAVENOUS | Status: DC

## 2021-09-21 NOTE — Progress Notes (Signed)
[facility] night cross cover note:  I placed an order for nightly CPAP in the setting of a known history of OSA and report that the patient compliantly uses CPAP on a nightly basis at home.      Babs Bertin, DO Hospitalist

## 2021-09-21 NOTE — Plan of Care (Signed)
  Problem: Skin Integrity: Goal: Risk for impaired skin integrity will decrease Outcome: Completed/Met   Problem: Pain Managment: Goal: General experience of comfort will improve Outcome: Completed/Met   Problem: Elimination: Goal: Will not experience complications related to urinary retention Outcome: Completed/Met   Problem: Nutrition: Goal: Adequate nutrition will be maintained Outcome: Completed/Met

## 2021-09-21 NOTE — Discharge Summary (Addendum)
Physician Discharge Summary  Michael Mcknight ZOX:[id number] DOB: [birth year] DOA: 09/19/2021  PCP: Leilani Able, FNP  Admit date: 09/19/2021 Discharge date: 09/22/2021  Admitted From: Home  Disposition:  Home   Recommendations for Outpatient Follow-up and new medication changes:  Follow up with Clydie Braun FNP in 7 to 10 days.  Follow up renal function as outpatient   I called his sister but not able to reach her, not able to leave any message.   Home Health: no   Equipment/Devices: no    Discharge Condition: stable  CODE STATUS: full  Diet recommendation:  heart healthy   Brief/Interim Summary: Michael Mcknight was admitted to the hospital with the working diagnosis of AKI on CKD IV   55 year old male past medical history for chronic kidney disease, paroxysmal atrial fibrillation, diastolic heart failure, hypertension and schizophrenia who presented with near syncope episode, and orthostatic symptoms.  Patient was his normal state of health, he was sitting on a couch at his home for a while after being in a grocery store shopping.  He stood up to go to the bathroom and felt lightheaded and weak, collapsing to the floor no loss of consciousness or tonic-clonic activity.  In the emergency department he continued to have orthostatic hypotension and was referred for further evaluation.  His blood pressure was 116/72, heart rate 66, respiratory rate 15, oxygen saturation 99%, his lungs were clear to auscultation bilaterally, heart S1-S2, present, rhythmic, soft abdomen, no lower extremity edema.   Sodium 132, potassium 4.2, chloride 1 4, bicarb 18, glucose 118, BUN 60, creatinine 4.94, white count 9.3, hemoglobin 12.0, hematocrit 36.7, platelets 309. SARS COVID-19 negative.   Urine analysis specific gravity 1.018, negative protein negative nitrates.   EKG 67 bpm, normal axis, QTC 520, right bundle branch block, sinus rhythm, no significant ST segment or T wave changes.    AKI on  CKD stage IV. Hyponatremia and non gap metabolic acidosis/ hypovolemic Patient was placed on intravenous fluids including bicarb drip with improvement of his volume status, orthostatic symptoms and kidney function.  At discharge she is urine output is 1555 mL over last 24 hours. His renal function shows a creatinine of 3.0, back to its baseline, BUN is 36, sodium 135, potassium 4.1, chloride 104, bicarb 25.  Plan to follow-up kidney function as an outpatient.  2. Paroxysmal atrial fibrillation prolonged Qtc.  Heart rate remained well controlled with amiodarone. Continue apixaban for anticoagulation.    3. Chronic congested heart failure.  Patient tolerated well intravenous fluids, no signs of exacerbation.  4. Schizophrenia.  On escitalopram, and risperidone. Continue with artane.   Late entry:  Patient feeling better, his discharge was held yesterday due to hypotension on standing. Today he is feeling well and denies any orthostatic symptoms, his diarrhea continue to improve.    Blood pressure: lying, 147/82                            Seating 139/79                           Standing 95/61 (asymptomatic)                            Seating from standing 109/70 Clinically euvolemic.   I suspect patient has autonomic dysfunction.  Instructed to do slow transitions from seating to standing and seat if  feeling lightheaded.  Discharge Diagnoses:  Principal Problem:   Acute kidney injury superimposed on CKD ([place]) Active Problems:   Schizophrenia ([place])   Chronic systolic congestive heart failure (HCC)   Orthostatic hypotension   Near syncope   AF (paroxysmal atrial fibrillation) (HCC)   Prolonged QT interval   AKI (acute kidney injury) ([place])    Discharge Instructions   Allergies as of 09/21/2021       Reactions   Codeine Nausea And Vomiting        Medication List     TAKE these medications    acetaminophen 500 MG tablet Commonly known as: TYLENOL Take 1,000 mg  by mouth every 6 (six) hours as needed for moderate pain or headache.   amiodarone 200 MG tablet Commonly known as: PACERONE Take 100 mg by mouth daily.   Eliquis 5 MG Tabs tablet Generic drug: apixaban Take 1 tablet (5 mg total) by mouth 2 (two) times daily.   escitalopram 10 MG tablet Commonly known as: LEXAPRO Take 10 mg by mouth daily.   risperidone 4 MG tablet Commonly known as: RISPERDAL Take 4 mg by mouth at bedtime.   risperiDONE 1 MG tablet Commonly known as: RISPERDAL Take 1 mg by mouth daily.   trihexyphenidyl 2 MG tablet Commonly known as: ARTANE Take 2 mg by mouth 2 (two) times daily.   Vitamin D (Ergocalciferol) 1.25 MG (50000 UNIT) Caps capsule Commonly known as: DRISDOL Take 50,000 Units by mouth every Sunday.        Allergies  Allergen Reactions   Codeine Nausea And Vomiting     Procedures/Studies: DG Chest Port 1 View  Result Date: 09/20/2021 CLINICAL DATA:  Generalized weakness. EXAM: PORTABLE CHEST 1 VIEW COMPARISON:  September 21, 2019 FINDINGS: Mildly decreased lung volumes are seen with multiple overlying radiopaque cardiac lead wires present. There is no evidence of acute infiltrate, pleural effusion or pneumothorax. The cardiac silhouette is mildly enlarged. The visualized skeletal structures are unremarkable. IMPRESSION: Mildly decreased lung volumes without evidence of acute or active cardiopulmonary disease. Electronically Signed   By: Virgina Norfolk M.D.   On: 09/20/2021 00:14     P  Subjective: Patient is feeling better, no further orthostatic symptoms, no nausea or vomiting, diarrhea improving.   Discharge Exam: Vitals:   09/21/21 0012 09/21/21 0442  BP: 126/72 (!) 137/51  Pulse: 70 70  Resp: 18 18  Temp: 97.8 F (36.6 C) 98.4 F (36.9 C)  SpO2: 98% 100%   Vitals:   09/20/21 1625 09/20/21 1932 09/21/21 0012 09/21/21 0442  BP: 124/69 133/86 126/72 (!) 137/51  Pulse: 65 71 70 70  Resp: 20 18 18 18   Temp: 98.1 F (36.7  C) 98.4 F (36.9 C) 97.8 F (36.6 C) 98.4 F (36.9 C)  TempSrc: Oral Oral Oral Oral  SpO2: 98% 98% 98% 100%  Weight:    101.8 kg  Height:        General: Not in pain or dyspnea  Neurology: Awake and alert, non focal  E ENT: no pallor, no icterus, oral mucosa moist Cardiovascular: No JVD. S1-S2 present, rhythmic, no gallops, rubs, or murmurs. No lower extremity edema. Pulmonary: positive breath sounds bilaterally, adequate air movement, no wheezing, rhonchi or rales. Gastrointestinal. Abdomen soft and non tender Skin. No rashes Musculoskeletal: no joint deformities   The results of significant diagnostics from this hospitalization (including imaging, microbiology, ancillary and laboratory) are listed below for reference.     Microbiology: Recent Results (from the past 240 hour(s))  Resp Panel by RT-PCR (Flu A&B, Covid) Nasopharyngeal Swab     Status: None   Collection Time: 09/20/21  4:42 AM   Specimen: Nasopharyngeal Swab; Nasopharyngeal(NP) swabs in vial transport medium  Result Value Ref Range Status   SARS Coronavirus 2 by RT PCR NEGATIVE NEGATIVE Final    Comment: (NOTE) SARS-CoV-2 target nucleic acids are NOT DETECTED.  The SARS-CoV-2 RNA is generally detectable in upper respiratory specimens during the acute phase of infection. The lowest concentration of SARS-CoV-2 viral copies this assay can detect is 138 copies/mL. A negative result does not preclude SARS-Cov-2 infection and should not be used as the sole basis for treatment or other patient management decisions. A negative result may occur with  improper specimen collection/handling, submission of specimen other than nasopharyngeal swab, presence of viral mutation(s) within the areas targeted by this assay, and inadequate number of viral copies(<138 copies/mL). A negative result must be combined with clinical observations, patient history, and epidemiological information. The expected result is Negative.  Fact  Sheet for Patients:  EntrepreneurPulse.com.au  Fact Sheet for Healthcare Providers:  IncredibleEmployment.be  This test is no t yet approved or cleared by the [place] FDA and  has been authorized for detection and/or diagnosis of SARS-CoV-2 by FDA under an Emergency Use Authorization (EUA). This EUA will remain  in effect (meaning this test can be used) for the duration of the COVID-19 declaration under Section 564(b)(1) of the Act, 21 U.S.C.section 360bbb-3(b)(1), unless the authorization is terminated  or revoked sooner.       Influenza A by PCR NEGATIVE NEGATIVE Final   Influenza B by PCR NEGATIVE NEGATIVE Final    Comment: (NOTE) The Xpert Xpress SARS-CoV-2/FLU/RSV plus assay is intended as an aid in the diagnosis of influenza from Nasopharyngeal swab specimens and should not be used as a sole basis for treatment. Nasal washings and aspirates are unacceptable for Xpert Xpress SARS-CoV-2/FLU/RSV testing.  Fact Sheet for Patients: EntrepreneurPulse.com.au  Fact Sheet for Healthcare Providers: IncredibleEmployment.be  This test is not yet approved or cleared by the [place] FDA and has been authorized for detection and/or diagnosis of SARS-CoV-2 by FDA under an Emergency Use Authorization (EUA). This EUA will remain in effect (meaning this test can be used) for the duration of the COVID-19 declaration under Section 564(b)(1) of the Act, 21 U.S.C. section 360bbb-3(b)(1), unless the authorization is terminated or revoked.  Performed at [facility], [place] [address] [place], [place] [address]      Labs: BNP (last 3 results) Recent Labs    09/20/21 0215  BNP 9.4   Basic Metabolic Panel: Recent Labs  Lab 09/19/21 2334 09/21/21 0413  NA 132* 135  K 4.2 4.1  CL 104 104  CO2 18* 25  GLUCOSE 118* 110*  BUN 60* 36*  CREATININE 4.94* 3.06*  CALCIUM 8.7* 8.4*   Liver Function  Tests: Recent Labs  Lab 09/19/21 2334  AST 63*  ALT 41  ALKPHOS 75  BILITOT 0.7  PROT 7.3  ALBUMIN 3.9   No results for input(s): LIPASE, AMYLASE in the last 168 hours. No results for input(s): AMMONIA in the last 168 hours. CBC: Recent Labs  Lab 09/19/21 2334  WBC 9.3  NEUTROABS 7.3  HGB 12.0*  HCT 36.7*  MCV 87.2  PLT 309   Cardiac Enzymes: No results for input(s): CKTOTAL, CKMB, CKMBINDEX, TROPONINI in the last 168 hours. BNP: Invalid input(s): POCBNP CBG: No results for input(s): GLUCAP in the last 168 hours. D-Dimer  No results for input(s): DDIMER in the last 72 hours. Hgb A1c No results for input(s): HGBA1C in the last 72 hours. Lipid Profile No results for input(s): CHOL, HDL, LDLCALC, TRIG, CHOLHDL, LDLDIRECT in the last 72 hours. Thyroid function studies No results for input(s): TSH, T4TOTAL, T3FREE, THYROIDAB in the last 72 hours.  Invalid input(s): FREET3 Anemia work up No results for input(s): VITAMINB12, FOLATE, FERRITIN, TIBC, IRON, RETICCTPCT in the last 72 hours. Urinalysis    Component Value Date/Time   COLORURINE YELLOW 09/20/2021 0518   APPEARANCEUR CLEAR 09/20/2021 0518   LABSPEC 1.018 09/20/2021 0518   PHURINE 5.0 09/20/2021 0518   GLUCOSEU NEGATIVE 09/20/2021 0518   HGBUR SMALL (A) 09/20/2021 0518   BILIRUBINUR NEGATIVE 09/20/2021 0518   KETONESUR NEGATIVE 09/20/2021 0518   PROTEINUR NEGATIVE 09/20/2021 0518   NITRITE NEGATIVE 09/20/2021 0518   LEUKOCYTESUR NEGATIVE 09/20/2021 0518   Sepsis Labs Invalid input(s): PROCALCITONIN,  WBC,  LACTICIDVEN Microbiology Recent Results (from the past 240 hour(s))  Resp Panel by RT-PCR (Flu A&B, Covid) Nasopharyngeal Swab     Status: None   Collection Time: 09/20/21  4:42 AM   Specimen: Nasopharyngeal Swab; Nasopharyngeal(NP) swabs in vial transport medium  Result Value Ref Range Status   SARS Coronavirus 2 by RT PCR NEGATIVE NEGATIVE Final    Comment: (NOTE) SARS-CoV-2 target nucleic acids  are NOT DETECTED.  The SARS-CoV-2 RNA is generally detectable in upper respiratory specimens during the acute phase of infection. The lowest concentration of SARS-CoV-2 viral copies this assay can detect is 138 copies/mL. A negative result does not preclude SARS-Cov-2 infection and should not be used as the sole basis for treatment or other patient management decisions. A negative result may occur with  improper specimen collection/handling, submission of specimen other than nasopharyngeal swab, presence of viral mutation(s) within the areas targeted by this assay, and inadequate number of viral copies(<138 copies/mL). A negative result must be combined with clinical observations, patient history, and epidemiological information. The expected result is Negative.  Fact Sheet for Patients:  EntrepreneurPulse.com.au  Fact Sheet for Healthcare Providers:  IncredibleEmployment.be  This test is no t yet approved or cleared by the [place] FDA and  has been authorized for detection and/or diagnosis of SARS-CoV-2 by FDA under an Emergency Use Authorization (EUA). This EUA will remain  in effect (meaning this test can be used) for the duration of the COVID-19 declaration under Section 564(b)(1) of the Act, 21 U.S.C.section 360bbb-3(b)(1), unless the authorization is terminated  or revoked sooner.       Influenza A by PCR NEGATIVE NEGATIVE Final   Influenza B by PCR NEGATIVE NEGATIVE Final    Comment: (NOTE) The Xpert Xpress SARS-CoV-2/FLU/RSV plus assay is intended as an aid in the diagnosis of influenza from Nasopharyngeal swab specimens and should not be used as a sole basis for treatment. Nasal washings and aspirates are unacceptable for Xpert Xpress SARS-CoV-2/FLU/RSV testing.  Fact Sheet for Patients: EntrepreneurPulse.com.au  Fact Sheet for Healthcare Providers: IncredibleEmployment.be  This test is  not yet approved or cleared by the [place] FDA and has been authorized for detection and/or diagnosis of SARS-CoV-2 by FDA under an Emergency Use Authorization (EUA). This EUA will remain in effect (meaning this test can be used) for the duration of the COVID-19 declaration under Section 564(b)(1) of the Act, 21 U.S.C. section 360bbb-3(b)(1), unless the authorization is terminated or revoked.  Performed at [facility], [place] [address] [place], [place] [address]  Time coordinating discharge: 45 minutes  SIGNED:   Tawni Millers, MD  [facility] 09/21/2021, 12:34 PM

## 2021-09-22 NOTE — Progress Notes (Signed)
Discharge instructions (including medications) discussed with and copy provided to patient/caregiver 

## 2021-09-22 NOTE — Plan of Care (Signed)
  Problem: Education: Goal: Knowledge of General Education information will improve Description: Including pain rating scale, medication(s)/side effects and non-pharmacologic comfort measures Outcome: Adequate for Discharge   

## 2021-09-22 NOTE — Plan of Care (Signed)
  Problem: Elimination: Goal: Will not experience complications related to bowel motility Outcome: Completed/Met   Problem: Clinical Measurements: Goal: Will remain free from infection Outcome: Completed/Met   Problem: Elimination: Goal: Will not experience complications related to bowel motility Outcome: Completed/Met

## 2021-09-22 NOTE — Progress Notes (Signed)
Patient feeling better, his discharge was held yesterday due to hypotension on standing. Today he is feeling well and denies any orthostatic symptoms, his diarrhea continue to improve.   Blood pressure: lying, 147/82                            Seating 139/79                           Standing 95/61 (asymptomatic)                            Seating from standing 109/70 Clinically euvolemic.  I suspect patient has autonomic dysfunction.  Instructed to do slow transitions from seating to standing and seat if feeling lightheaded.

## 2021-09-25 DIAGNOSIS — N1831 Chronic kidney disease, stage 3a: Secondary | ICD-10-CM | POA: Diagnosis not present

## 2021-09-25 DIAGNOSIS — D638 Anemia in other chronic diseases classified elsewhere: Secondary | ICD-10-CM | POA: Diagnosis not present

## 2021-09-25 DIAGNOSIS — I4891 Unspecified atrial fibrillation: Secondary | ICD-10-CM | POA: Diagnosis not present

## 2021-09-25 DIAGNOSIS — F209 Schizophrenia, unspecified: Secondary | ICD-10-CM | POA: Diagnosis not present

## 2021-09-25 DIAGNOSIS — E669 Obesity, unspecified: Secondary | ICD-10-CM | POA: Diagnosis not present

## 2021-09-25 DIAGNOSIS — Z6837 Body mass index (BMI) 37.0-37.9, adult: Secondary | ICD-10-CM | POA: Diagnosis not present

## 2021-11-20 DIAGNOSIS — F209 Schizophrenia, unspecified: Secondary | ICD-10-CM | POA: Diagnosis not present

## 2022-02-16 DIAGNOSIS — I1310 Hypertensive heart and chronic kidney disease without heart failure, with stage 1 through stage 4 chronic kidney disease, or unspecified chronic kidney disease: Secondary | ICD-10-CM | POA: Diagnosis not present

## 2022-02-16 DIAGNOSIS — E669 Obesity, unspecified: Secondary | ICD-10-CM | POA: Diagnosis not present

## 2022-02-16 DIAGNOSIS — N1832 Chronic kidney disease, stage 3b: Secondary | ICD-10-CM | POA: Diagnosis not present

## 2022-02-18 ENCOUNTER — Encounter: Payer: Self-pay | Admitting: Cardiology

## 2022-02-18 ENCOUNTER — Ambulatory Visit: Payer: PPO | Admitting: Cardiology

## 2022-02-18 VITALS — BP 142/92 | HR 82 | Ht 66.0 in | Wt 234.2 lb

## 2022-02-18 DIAGNOSIS — I509 Heart failure, unspecified: Secondary | ICD-10-CM | POA: Diagnosis not present

## 2022-02-18 DIAGNOSIS — E669 Obesity, unspecified: Secondary | ICD-10-CM

## 2022-02-18 DIAGNOSIS — I429 Cardiomyopathy, unspecified: Secondary | ICD-10-CM

## 2022-02-18 DIAGNOSIS — I484 Atypical atrial flutter: Secondary | ICD-10-CM

## 2022-02-18 DIAGNOSIS — I420 Dilated cardiomyopathy: Secondary | ICD-10-CM | POA: Diagnosis not present

## 2022-02-18 DIAGNOSIS — F209 Schizophrenia, unspecified: Secondary | ICD-10-CM

## 2022-02-18 NOTE — Progress Notes (Signed)
?Cardiology Office Note:   ? ?Date:  02/18/2022  ? ?ID:  Michael Mcknight, DOB 09-19-1966, MRN [id number] ? ?PCP:  Leilani Able, FNP  ?Cardiologist:  Jenne Campus, MD   ? ?Referring MD: Farrel Conners*  ? ?Chief Complaint  ?Patient presents with  ? Follow-up  ?   ?  ?I am doing fine ? ?History of Present Illness:   ? ?Michael Mcknight is a 56 y.o. male with past medical history significant for paroxysmal atrial flutter, status post cardioversion maintaining sinus rhythm with amiodarone, chronic kidney disease, cardiomyopathy initially detected in 2020 with ejection fraction 30% but now normalization, he also have schizophrenia.  He is in my office today.  Doing well.  Denies have any chest pain tightness squeezing pressure burning chest no palpitations dizziness swelling of lower extremities. ? ?Past Medical History:  ?Diagnosis Date  ? Atrial fibrillation/flutter 09/22/2019  ? Atypical atrial flutter ([place])   ? Benign essential HTN 09/22/2019  ? Cardiomegaly 09/22/2019  ? Cardiorenal syndrome with renal failure 06/26/2020  ? Chronic systolic congestive heart failure ([place]) 06/26/2020  ? CKD (chronic kidney disease) stage 4, GFR 15-29 ml/min (HCC) 06/26/2020  ? CKD (chronic kidney disease), stage III ([place]) 09/22/2019  ? Congestive heart failure due to cardiomyopathy [facility]) New York Heart Association class II 10/23/2019  ? Cyst of left kidney 10/04/2020  ? Dilated cardiomyopathy ([place]) ejection fraction 35% in November 2020 10/23/2019  ? Lobar pneumonia ([place]) 09/22/2019  ? Obesity (BMI 30-39.9) 06/26/2020  ? Pleural effusion 09/22/2019  ? Schizophrenia ([place]) 09/22/2019  ? ? ?Past Surgical History:  ?Procedure Laterality Date  ? CARDIOVERSION N/A 09/26/2019  ? Procedure: CARDIOVERSION;  Surgeon: Thayer Headings, MD;  Location: [place];  Service: Cardiovascular;  Laterality: N/A;  ? TEE WITHOUT CARDIOVERSION N/A 09/26/2019  ? Procedure: TRANSESOPHAGEAL ECHOCARDIOGRAM (TEE);  Surgeon: Acie Fredrickson Wonda Cheng, MD;   Location: [place];  Service: Cardiovascular;  Laterality: N/A;  ? ? ?Current Medications: ?Current Meds  ?Medication Sig  ? acetaminophen (TYLENOL) 500 MG tablet Take 1,000 mg by mouth every 6 (six) hours as needed for moderate pain or headache.  ? amiodarone (PACERONE) 200 MG tablet Take 100 mg by mouth daily.  ? apixaban (ELIQUIS) 5 MG TABS tablet Take 1 tablet (5 mg total) by mouth 2 (two) times daily.  ? escitalopram (LEXAPRO) 10 MG tablet Take 10 mg by mouth daily.  ? risperiDONE (RISPERDAL) 1 MG tablet Take 1 mg by mouth daily.  ? risperidone (RISPERDAL) 4 MG tablet Take 4 mg by mouth at bedtime.  ? trihexyphenidyl (ARTANE) 2 MG tablet Take 2 mg by mouth 2 (two) times daily.  ? Vitamin D, Ergocalciferol, (DRISDOL) 1.25 MG (50000 UNIT) CAPS capsule Take 50,000 Units by mouth every Sunday.  ?  ? ?Allergies:   Codeine  ? ?Social History  ? ?Socioeconomic History  ? Marital status: Married  ?  Spouse name: Not on file  ? Number of children: Not on file  ? Years of education: Not on file  ? Highest education level: Not on file  ?Occupational History  ? Not on file  ?Tobacco Use  ? Smoking status: Never  ? Smokeless tobacco: Never  ?Substance and Sexual Activity  ? Alcohol use: Not on file  ? Drug use: Not on file  ? Sexual activity: Not on file  ?Other Topics Concern  ? Not on file  ?Social History Narrative  ? Not on file  ? ?Social Determinants of Health  ? ?  Financial Resource Strain: Not on file  ?Food Insecurity: Not on file  ?Transportation Needs: Not on file  ?Physical Activity: Not on file  ?Stress: Not on file  ?Social Connections: Not on file  ?  ? ?Family History: ?The patient's family history is not on file. ?ROS:   ?Please see the history of present illness.    ?All 14 point review of systems negative except as described per history of present illness ? ?EKGs/Labs/Other Studies Reviewed:   ? ? ? ?Recent Labs: ?09/19/2021: ALT 41; Hemoglobin 12.0; Platelets 309 ?09/20/2021: B Natriuretic Peptide  9.4 ?09/21/2021: BUN 36; Creatinine, Ser 3.06; Potassium 4.1; Sodium 135  ?Recent Lipid Panel ?   ?Component Value Date/Time  ? CHOL 138 09/23/2019 0247  ? TRIG 84 09/23/2019 0247  ? HDL 35 (L) 09/23/2019 0247  ? CHOLHDL 3.9 09/23/2019 0247  ? VLDL 17 09/23/2019 0247  ? [place] 86 09/23/2019 0247  ? ? ?Physical Exam:   ? ?VS:  BP (!) 142/92 (BP Location: Left Arm, Patient Position: Sitting)   Pulse 82   Ht '5\' 6"'$  (1.676 m)   Wt 234 lb 3.2 oz (106.2 kg)   SpO2 95%   BMI 37.80 kg/m?    ? ?Wt Readings from Last 3 Encounters:  ?02/18/22 234 lb 3.2 oz (106.2 kg)  ?09/22/21 226 lb 3.2 oz (102.6 kg)  ?09/19/21 222 lb (100.7 kg)  ?  ? ?GEN:  Well nourished, well developed in no acute distress ?HEENT: Normal ?NECK: No JVD; No carotid bruits ?LYMPHATICS: No lymphadenopathy ?CARDIAC: RRR, no murmurs, no rubs, no gallops ?RESPIRATORY:  Clear to auscultation without rales, wheezing or rhonchi  ?ABDOMEN: Soft, non-tender, non-distended ?MUSCULOSKELETAL:  No edema; No deformity  ?SKIN: Warm and dry ?LOWER EXTREMITIES: no swelling ?NEUROLOGIC:  Alert and oriented x 3 ?PSYCHIATRIC:  Normal affect  ? ?ASSESSMENT:   ? ?1. Atypical atrial flutter ([place])   ?2. Dilated cardiomyopathy ([place]) ejection fraction 35% in November 2020   ?3. Congestive heart failure due to cardiomyopathy [facility]) New York Heart Association class II   ?4. Schizophrenia, unspecified type ([place])   ?5. Obesity (BMI 30-39.9)   ? ?PLAN:   ? ?In order of problems listed above: ? ?Paroxysmal atrial flutter.  Maintaining sinus rhythm, anticoagulated, CHADS2 Vascor equals 2 we will continue.  He is only very small dose of amiodarone only 100 mg daily.  In the future if no arrhythmia recur we will consider discontinuation of this medication. ?History of dilated cardiomyopathy last echocardiogram showed preserved left ventricle ejection fraction we will continue monitoring. ?Congestive heart failure.  Compensated continue present management. ?Schizophrenia  stable. ?Dyslipidemia I did review K PN which show me his LDL of 86 HDL 49 is acceptable cholesterol profile well we will continue ? ? ?Medication Adjustments/Labs and Tests Ordered: ?Current medicines are reviewed at length with the patient today.  Concerns regarding medicines are outlined above.  ?No orders of the defined types were placed in this encounter. ? ?Medication changes: No orders of the defined types were placed in this encounter. ? ? ?Signed, ?Park Liter, MD, [facility] ?02/18/2022 9:09 AM    ?[place] ?

## 2022-02-18 NOTE — Patient Instructions (Signed)

## 2022-03-19 DIAGNOSIS — F209 Schizophrenia, unspecified: Secondary | ICD-10-CM | POA: Diagnosis not present

## 2022-05-08 ENCOUNTER — Other Ambulatory Visit: Payer: Self-pay | Admitting: Cardiology

## 2022-05-08 DIAGNOSIS — I484 Atypical atrial flutter: Secondary | ICD-10-CM

## 2022-05-13 ENCOUNTER — Other Ambulatory Visit: Payer: Self-pay | Admitting: Cardiology

## 2022-06-17 IMAGING — DX DG CHEST 1V PORT
1 series · 1 of 1 positions shown · non-contrast
Comparison: September 21, 2019

CLINICAL DATA: Generalized weakness.

EXAM:
PORTABLE CHEST 1 VIEW

[chest]
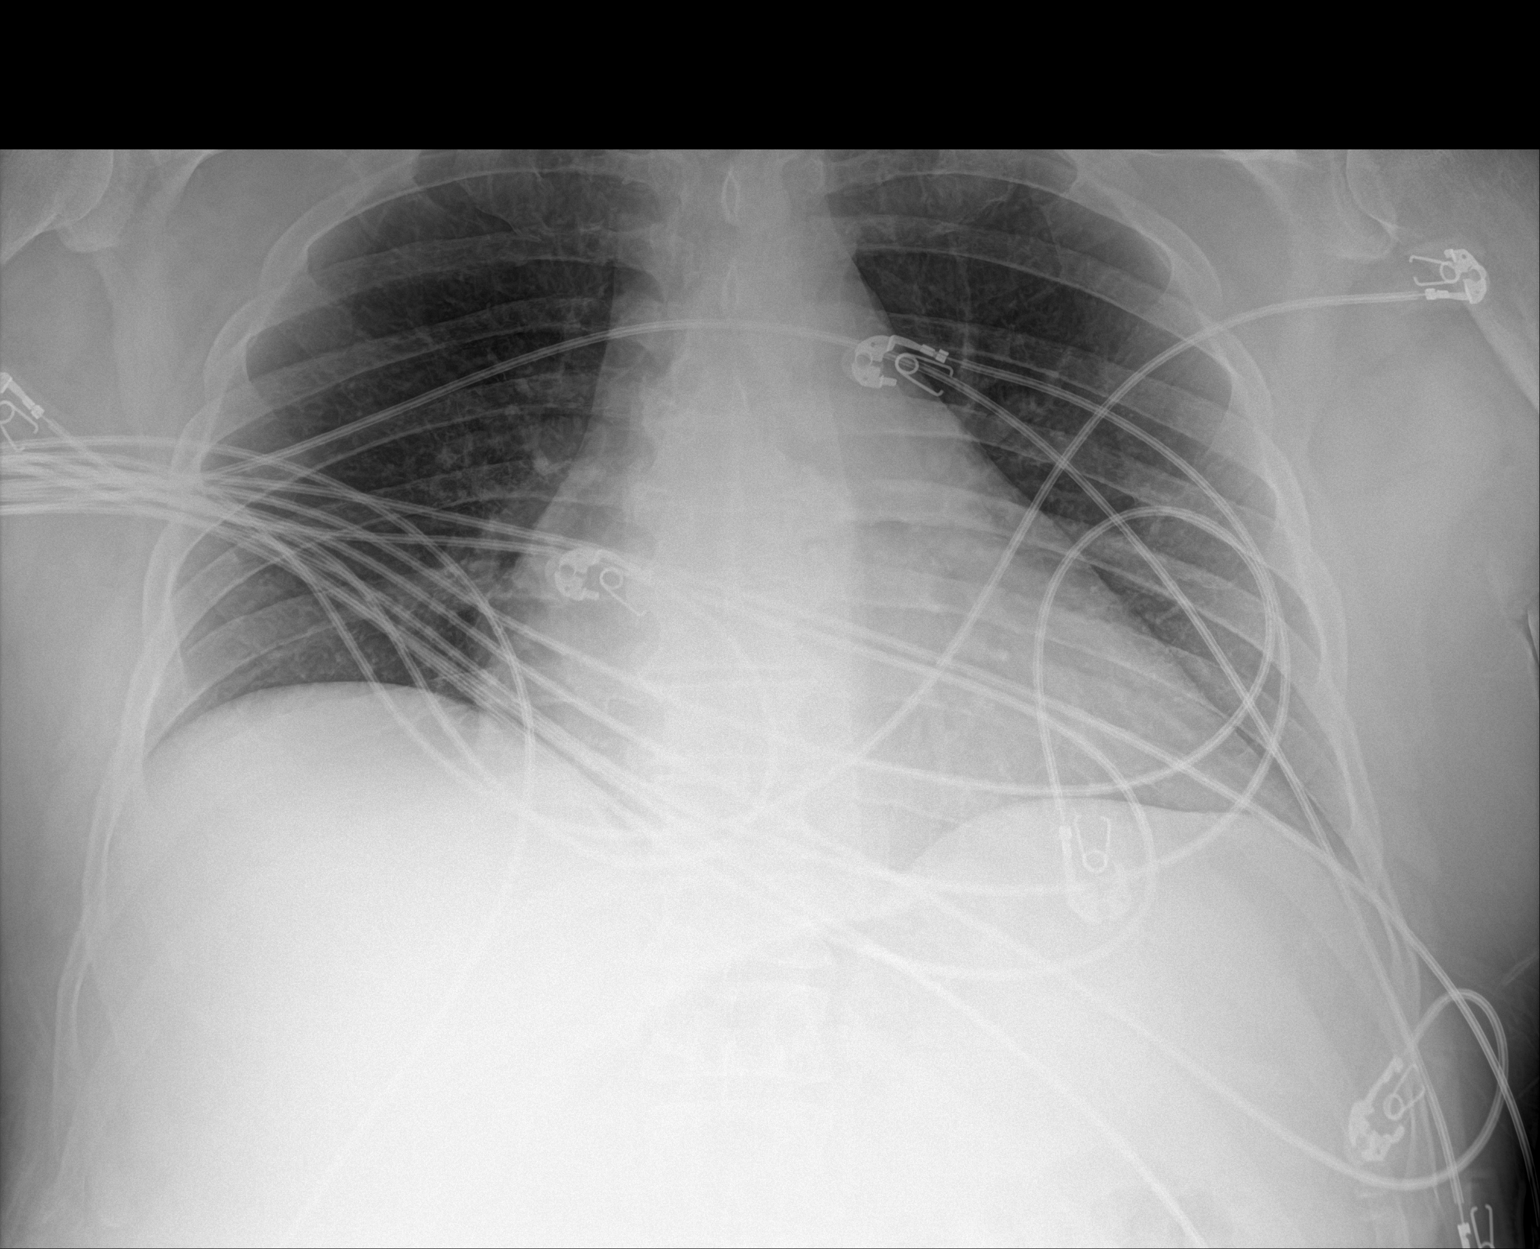

[1 of 1 positions shown; findings below may reference images not displayed]

FINDINGS: Mildly decreased lung volumes are seen with multiple overlying
radiopaque cardiac lead wires present. There is no evidence of acute
infiltrate, pleural effusion or pneumothorax. The cardiac silhouette
is mildly enlarged. The visualized skeletal structures are
unremarkable.
IMPRESSION: Mildly decreased lung volumes without evidence of acute or active
cardiopulmonary disease.

## 2022-07-23 DIAGNOSIS — F209 Schizophrenia, unspecified: Secondary | ICD-10-CM | POA: Diagnosis not present

## 2022-08-06 DIAGNOSIS — N1832 Chronic kidney disease, stage 3b: Secondary | ICD-10-CM | POA: Diagnosis not present

## 2022-08-17 ENCOUNTER — Ambulatory Visit: Payer: PPO | Attending: Cardiology | Admitting: Cardiology

## 2022-08-17 ENCOUNTER — Encounter: Payer: Self-pay | Admitting: Cardiology

## 2022-08-17 VITALS — BP 100/70 | HR 63 | Ht 66.0 in | Wt 247.4 lb

## 2022-08-17 DIAGNOSIS — I420 Dilated cardiomyopathy: Secondary | ICD-10-CM | POA: Diagnosis not present

## 2022-08-17 DIAGNOSIS — R0609 Other forms of dyspnea: Secondary | ICD-10-CM

## 2022-08-17 DIAGNOSIS — I10 Essential (primary) hypertension: Secondary | ICD-10-CM | POA: Diagnosis not present

## 2022-08-17 DIAGNOSIS — I480 Paroxysmal atrial fibrillation: Secondary | ICD-10-CM | POA: Diagnosis not present

## 2022-08-17 DIAGNOSIS — F209 Schizophrenia, unspecified: Secondary | ICD-10-CM

## 2022-08-17 NOTE — Patient Instructions (Addendum)
Medication Instructions:  Your physician has recommended you make the following change in your medication:   DECREASE: Amiodarone to '100mg'$  daily then in 1 month stop completely.    Lab Work: None Ordered If you have labs (blood work) drawn today and your tests are completely normal, you will receive your results only by: [place] (if you have MyChart) OR A paper copy in the mail If you have any lab test that is abnormal or we need to change your treatment, we will call you to review the results.   Testing/Procedures: Your physician has requested that you have an echocardiogram. Echocardiography is a painless test that uses sound waves to create images of your heart. It provides your doctor with information about the size and shape of your heart and how well your heart's chambers and valves are working. This procedure takes approximately one hour. There are no restrictions for this procedure.    Follow-Up: At [facility], you and your health needs are our priority.  As part of our continuing mission to provide you with exceptional heart care, we have created designated Provider Care Teams.  These Care Teams include your primary Cardiologist (physician) and Advanced Practice Providers (APPs -  Physician Assistants and Nurse Practitioners) who all work together to provide you with the care you need, when you need it.  We recommend signing up for the patient portal called "MyChart".  Sign up information is provided on this After Visit Summary.  MyChart is used to connect with patients for Virtual Visits (Telemedicine).  Patients are able to view lab/test results, encounter notes, upcoming appointments, etc.  Non-urgent messages can be sent to your provider as well.   To learn more about what you can do with MyChart, go to [email or web address].    Your next appointment:   6 month(s)  The format for your next appointment:   In Person  Provider:   Jenne Campus, MD    Other  Instructions NA

## 2022-08-17 NOTE — Addendum Note (Signed)
Addended by: Jacobo Forest D on: 08/17/2022 04:01 PM   Modules accepted: Orders

## 2022-08-17 NOTE — Progress Notes (Signed)
Cardiology Office Note:    Date:  08/17/2022   ID:  Michael Mcknight, DOB 20-Aug-1966, MRN [id number]  PCP:  Leilani Able, FNP (Inactive)  Cardiologist:  Jenne Campus, MD    Referring MD: No ref. provider found   Chief Complaint  Patient presents with   Follow-up  Doing fine  History of Present Illness:    Michael Mcknight is a 56 y.o. male with past medical history significant for paroxysmal atrial flutter, status post cardioversion maintained sinus rhythm with amiodarone, chronic kidney disease, cardiomyopathy initially detected in 2020 with ejection fraction 30% but recent echocardiogram showed normalization, schizophrenia.  He is in my office today for follow-up.  Overall he is doing fine denies have any chest pain tightness squeezing pressure burning chest no palpitations dizziness swelling of lower extremities  Past Medical History:  Diagnosis Date   Atrial fibrillation/flutter 09/22/2019   Atypical atrial flutter (HCC)    Benign essential HTN 09/22/2019   Cardiomegaly 09/22/2019   Cardiorenal syndrome with renal failure 5/78/4696   Chronic systolic congestive heart failure ([place]) 06/26/2020   CKD (chronic kidney disease) stage 4, GFR 15-29 ml/min ([place]) 06/26/2020   CKD (chronic kidney disease), stage III ([place]) 09/22/2019   Congestive heart failure due to cardiomyopathy [facility]) New York Heart Association class II 10/23/2019   Cyst of left kidney 10/04/2020   Dilated cardiomyopathy (HCC) ejection fraction 35% in November 2020 10/23/2019   Lobar pneumonia ([place]) 09/22/2019   Obesity (BMI 30-39.9) 06/26/2020   Pleural effusion 09/22/2019   Schizophrenia ([place]) 09/22/2019    Past Surgical History:  Procedure Laterality Date   CARDIOVERSION N/A 09/26/2019   Procedure: CARDIOVERSION;  Surgeon: Nahser, Wonda Cheng, MD;  Location: [place];  Service: Cardiovascular;  Laterality: N/A;   TEE WITHOUT CARDIOVERSION N/A 09/26/2019   Procedure: TRANSESOPHAGEAL ECHOCARDIOGRAM (TEE);   Surgeon: Thayer Headings, MD;  Location: [facility] ENDOSCOPY;  Service: Cardiovascular;  Laterality: N/A;    Current Medications: Current Meds  Medication Sig   acetaminophen (TYLENOL) 500 MG tablet Take 1,000 mg by mouth every 6 (six) hours as needed for moderate pain or headache.   amiodarone (PACERONE) 200 MG tablet TAKE ONE TABLET BY MOUTH EVERY DAY (Patient taking differently: Take 200 mg by mouth daily.)   apixaban (ELIQUIS) 5 MG TABS tablet TAKE ONE TABLET BY MOUTH EVERY DAY and TAKE ONE TABLET BY MOUTH AT BEDTIME (Patient taking differently: Take 5 mg by mouth 2 (two) times daily.)   escitalopram (LEXAPRO) 10 MG tablet Take 10 mg by mouth daily.   risperidone (RISPERDAL) 4 MG tablet Take 4 mg by mouth at bedtime.   trihexyphenidyl (ARTANE) 2 MG tablet Take 2 mg by mouth 2 (two) times daily.   Vitamin D, Ergocalciferol, (DRISDOL) 1.25 MG (50000 UNIT) CAPS capsule Take 50,000 Units by mouth every Sunday.   [DISCONTINUED] risperiDONE (RISPERDAL) 1 MG tablet Take 1 mg by mouth daily.     Allergies:   Codeine   Social History   Socioeconomic History   Marital status: Married    Spouse name: Not on file   Number of children: Not on file   Years of education: Not on file   Highest education level: Not on file  Occupational History   Not on file  Tobacco Use   Smoking status: Never   Smokeless tobacco: Never  Substance and Sexual Activity   Alcohol use: Not on file   Drug use: Not on file   Sexual activity: Not on file  Other  Topics Concern   Not on file  Social History Narrative   Not on file   Social Determinants of Health   Financial Resource Strain: Not on file  Food Insecurity: Not on file  Transportation Needs: Not on file  Physical Activity: Not on file  Stress: Not on file  Social Connections: Not on file     Family History: The patient's family history is not on file. ROS:   Please see the history of present illness.    All 14 point review of systems negative  except as described per history of present illness  EKGs/Labs/Other Studies Reviewed:      Recent Labs: 09/19/2021: ALT 41; Hemoglobin 12.0; Platelets 309 09/20/2021: B Natriuretic Peptide 9.4 09/21/2021: BUN 36; Creatinine, Ser 3.06; Potassium 4.1; Sodium 135  Recent Lipid Panel    Component Value Date/Time   CHOL 138 09/23/2019 0247   TRIG 84 09/23/2019 0247   HDL 35 (L) 09/23/2019 0247   CHOLHDL 3.9 09/23/2019 0247   VLDL 17 09/23/2019 0247   LDLCALC 86 09/23/2019 0247    Physical Exam:    VS:  BP 100/70 (BP Location: Left Arm, Patient Position: Sitting)   Pulse 63   Ht '5\' 6"'$  (1.676 m)   Wt 247 lb 6.4 oz (112.2 kg)   SpO2 96%   BMI 39.93 kg/m     Wt Readings from Last 3 Encounters:  08/17/22 247 lb 6.4 oz (112.2 kg)  02/18/22 234 lb 3.2 oz (106.2 kg)  09/22/21 226 lb 3.2 oz (102.6 kg)     GEN:  Well nourished, well developed in no acute distress HEENT: Normal NECK: No JVD; No carotid bruits LYMPHATICS: No lymphadenopathy CARDIAC: RRR, no murmurs, no rubs, no gallops RESPIRATORY:  Clear to auscultation without rales, wheezing or rhonchi  ABDOMEN: Soft, non-tender, non-distended MUSCULOSKELETAL:  No edema; No deformity  SKIN: Warm and dry LOWER EXTREMITIES: no swelling NEUROLOGIC:  Alert and oriented x 3 PSYCHIATRIC:  Normal affect   ASSESSMENT:    1. Dilated cardiomyopathy (HCC) ejection fraction 35% in November 2020   2. Benign essential HTN   3. Schizophrenia, unspecified type (HCC)   4. AF (paroxysmal atrial fibrillation) (HCC)    PLAN:    In order of problems listed above:  Paroxysmal atrial flutter.  He is on amiodarone still I will cut down amiodarone 200 mg daily asking to discontinue in about a month if there is no palpitations we will continue anticoagulation at least for now. Essential hypertension.  Blood pressure seems to be well controlled continue present management. History of cardiomyopathy I will ask him to have echocardiogram performed  to recheck left ventricle ejection fraction Dyslipidemia I did review K PN which show me his LDL 86 HDL 49 but this is from 2021 we will make arrangements for his fasting lipid profile to be rechecked   Medication Adjustments/Labs and Tests Ordered: Current medicines are reviewed at length with the patient today.  Concerns regarding medicines are outlined above.  No orders of the defined types were placed in this encounter.  Medication changes: No orders of the defined types were placed in this encounter.   Signed, Park Liter, MD, [facility] 08/17/2022 3:46 PM    [place]

## 2022-08-18 DIAGNOSIS — N184 Chronic kidney disease, stage 4 (severe): Secondary | ICD-10-CM | POA: Diagnosis not present

## 2022-08-18 DIAGNOSIS — Z6839 Body mass index (BMI) 39.0-39.9, adult: Secondary | ICD-10-CM | POA: Diagnosis not present

## 2022-08-18 DIAGNOSIS — E669 Obesity, unspecified: Secondary | ICD-10-CM | POA: Diagnosis not present

## 2022-08-18 DIAGNOSIS — I129 Hypertensive chronic kidney disease with stage 1 through stage 4 chronic kidney disease, or unspecified chronic kidney disease: Secondary | ICD-10-CM | POA: Diagnosis not present

## 2022-08-27 ENCOUNTER — Ambulatory Visit: Payer: PPO | Attending: Cardiology

## 2022-08-27 DIAGNOSIS — R0609 Other forms of dyspnea: Secondary | ICD-10-CM

## 2022-08-27 LAB — ECHOCARDIOGRAM COMPLETE
Area-P 1/2: 3.53 cm2
MV M vel: 5.06 m/s
MV Peak grad: 102.4 mmHg
S' Lateral: 3.8 cm

## 2022-09-07 ENCOUNTER — Telehealth: Payer: Self-pay

## 2022-09-07 NOTE — Telephone Encounter (Signed)
Results reviewed with pt as per Dr. Krasowski's note.  Pt verbalized understanding and had no additional questions. Routed to PCP  

## 2022-10-22 ENCOUNTER — Other Ambulatory Visit: Payer: Self-pay | Admitting: Cardiology

## 2022-10-22 DIAGNOSIS — F209 Schizophrenia, unspecified: Secondary | ICD-10-CM | POA: Diagnosis not present

## 2022-10-22 DIAGNOSIS — I484 Atypical atrial flutter: Secondary | ICD-10-CM

## 2022-10-22 NOTE — Telephone Encounter (Signed)
Prescription refill request for Eliquis received. Indication: Afultter Last office visit: 08/17/22 Agustin Cree) Scr: 2.52 (08/06/22)  Age: 56 Weight: 112.2kg  Appropriate dose and refill sent to requested pharmacy.

## 2022-11-20 DIAGNOSIS — Z23 Encounter for immunization: Secondary | ICD-10-CM | POA: Diagnosis not present

## 2022-11-20 DIAGNOSIS — Z801 Family history of malignant neoplasm of trachea, bronchus and lung: Secondary | ICD-10-CM | POA: Diagnosis not present

## 2022-11-20 DIAGNOSIS — F259 Schizoaffective disorder, unspecified: Secondary | ICD-10-CM | POA: Diagnosis not present

## 2022-11-20 DIAGNOSIS — I10 Essential (primary) hypertension: Secondary | ICD-10-CM | POA: Diagnosis not present

## 2022-11-20 DIAGNOSIS — I252 Old myocardial infarction: Secondary | ICD-10-CM | POA: Diagnosis not present

## 2022-11-20 DIAGNOSIS — N184 Chronic kidney disease, stage 4 (severe): Secondary | ICD-10-CM | POA: Diagnosis not present

## 2022-11-30 DIAGNOSIS — L609 Nail disorder, unspecified: Secondary | ICD-10-CM | POA: Diagnosis not present

## 2022-11-30 DIAGNOSIS — M25475 Effusion, left foot: Secondary | ICD-10-CM | POA: Diagnosis not present

## 2023-01-27 DIAGNOSIS — N184 Chronic kidney disease, stage 4 (severe): Secondary | ICD-10-CM | POA: Diagnosis not present

## 2023-02-17 DIAGNOSIS — N1832 Chronic kidney disease, stage 3b: Secondary | ICD-10-CM | POA: Diagnosis not present

## 2023-02-17 DIAGNOSIS — N183 Chronic kidney disease, stage 3 unspecified: Secondary | ICD-10-CM | POA: Diagnosis not present

## 2023-02-17 DIAGNOSIS — I129 Hypertensive chronic kidney disease with stage 1 through stage 4 chronic kidney disease, or unspecified chronic kidney disease: Secondary | ICD-10-CM | POA: Diagnosis not present

## 2023-02-22 DIAGNOSIS — F209 Schizophrenia, unspecified: Secondary | ICD-10-CM | POA: Diagnosis not present

## 2023-04-20 ENCOUNTER — Other Ambulatory Visit: Payer: Self-pay | Admitting: Cardiology

## 2023-04-20 DIAGNOSIS — I484 Atypical atrial flutter: Secondary | ICD-10-CM

## 2023-04-20 NOTE — Telephone Encounter (Signed)
Prescription refill request for Eliquis received. Indication: A Flutter Last office visit: 08/17/22  Kandyce Rud MD Scr: 2.52 on 08/06/22  Epic Age: 57 Weight: 112.2kg  Based on above findings Eliquis 5mg  twice daily is the appropriate dose.  Refill approved.

## 2023-05-20 NOTE — Progress Notes (Signed)
Cardiology Office Note:  .   Date:  05/21/2023  ID:  Michael Mcknight, DOB 1966-09-17, MRN [id number] PCP: Ellis Parents, FNP  [facility] Providers Cardiologist:  Norman Herrlich, MD    History of Present Illness: Michael Mcknight is a 57 y.o. male with a past medical history of hypertension, atrial fibrillation, atrial flutter, HFrEF, CKD stage IV, prolonged QT interval, schizophrenia, OSA on CPAP.  Establish care with Dr. Bing Matter in 2020 posthospitalization for newly diagnosed atrial fibrillation.  He had undergone a TEE and was cardioverted, EF 25 to 30%, GDMT prohibited secondary to kidney dysfunction.  Most recently evaluated by Dr. Bing Matter on 08/17/2022, he was doing well at this time and offers no complaints.  His amiodarone was decreased to 200 mg daily and he was advised to discontinue in a month if he had no further palpitations.  Repeat echo was arranged and completed on 08/27/2022 revealing an EF of 55 to 60%, grade 2 DD, mildly elevated PASP, no valvular abnormalities.  He presents today for follow-up of his hypertension, atrial fibrillation, heart failure.  He offers no complaints.  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  He has not noticed any further palpitations.  There is some confusion surrounding his medications, apparently they are prepared by local pharmacy and a prepack, we did contact his pharmacy and his medication list is now correct.  Apparently he was post to be on amlodipine but he had not been taking this.  He follows with nephrology and will see them again in October. He denies chest pain, palpitations, dyspnea, pnd, orthopnea, n, v, dizziness, syncope, edema, weight gain, or early satiety.   ROS: negative  Studies Reviewed: [name] Kitchen   EKG Interpretation Date/Time:  Friday May 21 2023 09:32:41 EDT Ventricular Rate:  78 PR Interval:  144 QRS Duration:  122 QT Interval:  414 QTC Calculation: 471 R Axis:   64  Text  Interpretation: Sinus rhythm with Premature supraventricular complexes Right bundle branch block Abnormal ECG When compared with ECG of 20-Sep-2021 01:52, PREVIOUS ECG IS PRESENT Confirmed by Wallis Bamberg [phone]) on 05/21/2023 12:22:11 PM    Cardiac Studies & Procedures       ECHOCARDIOGRAM  ECHOCARDIOGRAM COMPLETE 08/27/2022  Narrative ECHOCARDIOGRAM REPORT    [name] Date of Exam: 08/27/2022 Medical Rec #:  [id number]        Height:       66.0 in Accession #:    [id number]       Weight:       247.4 lb Date of Birth:  Aug 24, 1966        BSA:          2.189 m Patient Age:    56 years         BP:           100/70 mmHg Patient Gender: M                HR:           65 bpm. Exam Location:  [place]  Procedure: 2D Echo, Cardiac Doppler, Color Doppler and Strain Analysis  Indications:    Dyspnea on exertion [R06.09 (ICD-10-CM)]  History:        Patient has prior history of Echocardiogram examinations, most recent 05/14/2021. CHF and Cardiomegaly, Arrythmias:Atrial Fibrillation and Atrial Flutter, Signs/Symptoms:Dyspnea; Risk Factors:Hypertension.  Sonographer:    Margreta Journey RDCS Referring Phys:  [id number] ROBERT J KRASOWSKI  IMPRESSIONS   1. Left ventricular ejection fraction, by estimation, is 55 to 60%. The left ventricle has normal function. Left ventricular endocardial border not optimally defined to evaluate regional wall motion. Left ventricular diastolic parameters are consistent with Grade II diastolic dysfunction (pseudonormalization). Elevated left atrial pressure. The average left ventricular global longitudinal strain is -16.7 %. 2. Right ventricular systolic function is normal. The right ventricular size is normal. There is mildly elevated pulmonary artery systolic pressure. 3. A small pericardial effusion is present. The pericardial effusion is posterior to the left ventricle. 4. The mitral valve is normal in structure. No evidence of mitral  valve regurgitation. No evidence of mitral stenosis. 5. The aortic valve is tricuspid. Aortic valve regurgitation is not visualized. No aortic stenosis is present. 6. Aortic DTA is NWV.  FINDINGS Left Ventricle: Left ventricular ejection fraction, by estimation, is 55 to 60%. The left ventricle has normal function. Left ventricular endocardial border not optimally defined to evaluate regional wall motion. The average left ventricular global longitudinal strain is -16.7 %. The left ventricular internal cavity size was normal in size. There is no left ventricular hypertrophy. Left ventricular diastolic parameters are consistent with Grade II diastolic dysfunction (pseudonormalization). Elevated left atrial pressure.  Right Ventricle: The right ventricular size is normal. No increase in right ventricular wall thickness. Right ventricular systolic function is normal. There is mildly elevated pulmonary artery systolic pressure. The tricuspid regurgitant velocity is 2.79 m/s, and with an assumed right atrial pressure of 8 mmHg, the estimated right ventricular systolic pressure is 39.1 mmHg.  Left Atrium: Left atrial size was normal in size.  Right Atrium: Right atrial size was normal in size.  Pericardium: A small pericardial effusion is present. The pericardial effusion is posterior to the left ventricle.  Mitral Valve: The mitral valve is normal in structure. No evidence of mitral valve regurgitation. No evidence of mitral valve stenosis.  Tricuspid Valve: The tricuspid valve is normal in structure. Tricuspid valve regurgitation is mild . No evidence of tricuspid stenosis.  Aortic Valve: The aortic valve is tricuspid. Aortic valve regurgitation is not visualized. No aortic stenosis is present.  Pulmonic Valve: The pulmonic valve was normal in structure. Pulmonic valve regurgitation is trivial. No evidence of pulmonic stenosis.  Aorta: The aortic root and ascending aorta are structurally normal,  with no evidence of dilitation, the aortic arch was not well visualized, the aortic root is normal in size and structure and DTA is NWV.  Venous: A systolic blunting flow pattern is recorded from the right upper pulmonary vein. The inferior vena cava was not well visualized.  IAS/Shunts: No atrial level shunt detected by color flow Doppler.   LEFT VENTRICLE PLAX 2D LVIDd:         5.60 cm   Diastology LVIDs:         3.80 cm   LV e' medial:    6.31 cm/s LV PW:         1.10 cm   LV E/e' medial:  17.4 LV IVS:        1.10 cm   LV e' lateral:   8.38 cm/s LVOT diam:     1.90 cm   LV E/e' lateral: 13.1 LV SV:         74 LV SV Index:   34        2D Longitudinal Strain LVOT Area:     2.84 cm  2D Strain GLS Avg:     -  16.7 %   RIGHT VENTRICLE             IVC RV Basal diam:  3.20 cm     IVC diam: 2.40 cm RV S prime:     12.30 cm/s TAPSE (M-mode): 3.1 cm  LEFT ATRIUM             Index        RIGHT ATRIUM           Index LA diam:        4.70 cm 2.15 cm/m   RA Area:     16.20 cm LA Vol (A2C):   63.0 ml 28.78 ml/m  RA Volume:   36.80 ml  16.81 ml/m LA Vol (A4C):   55.9 ml 25.54 ml/m LA Biplane Vol: 59.5 ml 27.18 ml/m AORTIC VALVE LVOT Vmax:   116.00 cm/s LVOT Vmean:  78.000 cm/s LVOT VTI:    0.260 m  AORTA Ao Root diam: 3.90 cm Ao Asc diam:  3.20 cm Ao Desc diam: 2.10 cm  MITRAL VALVE                TRICUSPID VALVE MV Area (PHT): 3.53 cm     TR Peak grad:   31.1 mmHg MV Decel Time: 215 msec     TR Vmax:        279.00 cm/s MR Peak grad: 102.4 mmHg MR Vmax:      506.00 cm/s   SHUNTS MV E velocity: 110.00 cm/s  Systemic VTI:  0.26 m MV A velocity: 87.30 cm/s   Systemic Diam: 1.90 cm MV E/A ratio:  1.26  Norman Herrlich MD Electronically signed by Norman Herrlich MD Signature Date/Time: 08/27/2022/1:52:35 PM    Final   TEE  ECHO TEE 09/26/2019  Narrative TRANSESOPHOGEAL ECHO REPORT    [name] Date of Exam: 09/26/2019 Medical Rec #:  [id number]         Height:       67.0 in Accession #:    [id number]       Weight:       231.0 lb Date of Birth:  [birth year]        BSA:          2.15 m Patient Age:    53 years         BP:           122/86 mmHg Patient Gender: M                HR:           120 bpm. Exam Location:  Inpatient   Procedure: Transesophageal Echo, Cardiac Doppler and Color Doppler  Indications:     I48.92* Unspecified atrial flutter  History:         Patient has no prior history of Echocardiogram examinations. Cardiomegaly; Arrythmias:Atrial Fibrillation and Atrial Flutter.  Sonographer:     Sheralyn Boatman RDCS Referring Phys:  [id number] Tacey Ruiz DUNN Diagnosing Phys: Kristeen Miss MD    PROCEDURE: The TEE was followed by a successful Cardioversion. Patients was monitored while under deep sedation Anesthestetic sedation was provided intravenously by Anesthesiology: 250mg  of Propofol. The transesophogeal probe was passed through the esophogus of the patient. Imaged were obtained with the patient in a left lateral decubitus position. The patient developed no complications during the procedure.  IMPRESSIONS   1. Left ventricular ejection fraction, by visual estimation, is 30 to 35%. The left ventricle has moderate to severely  decreased function. There is no left ventricular hypertrophy. 2. Global right ventricle has mildly reduced systolic function.The right ventricular size is normal. No increase in right ventricular wall thickness. 3. Left atrial size was normal. 4. No thrombi in the LA and LAA. 5. Right atrial size was normal. 6. The mitral valve is normal in structure. Mild to moderate mitral valve regurgitation. 7. The tricuspid valve is normal in structure. Tricuspid valve regurgitation moderate. 8. The aortic valve is normal in structure. Aortic valve regurgitation is trivial. 9. The pulmonic valve was normal in structure. Pulmonic valve regurgitation is trivial. 10. Moderately elevated pulmonary artery systolic  pressure.  FINDINGS Left Ventricle: Left ventricular ejection fraction, by visual estimation, is 30 to 35%. The left ventricle has moderate to severely decreased function. There is no left ventricular hypertrophy.  Right Ventricle: The right ventricular size is normal. No increase in right ventricular wall thickness. Global RV systolic function is has mildly reduced systolic function. The tricuspid regurgitant velocity is 3.01 m/s, and with an assumed right atrial pressure of 15 mmHg, the estimated right ventricular systolic pressure is moderately elevated at 51.2 mmHg.  Left Atrium: Left atrial size was normal in size. No thrombi in the LA and LAA.  Right Atrium: Right atrial size was normal in size  Pericardium: There is no evidence of pericardial effusion.  Mitral Valve: The mitral valve is normal in structure. There is mild thickening of the mitral valve leaflet(s). Mild to moderate mitral valve regurgitation.  Tricuspid Valve: The tricuspid valve is normal in structure. Tricuspid valve regurgitation moderate.  Aortic Valve: The aortic valve is normal in structure. Aortic valve regurgitation is trivial.  Pulmonic Valve: The pulmonic valve was normal in structure. Pulmonic valve regurgitation is trivial.  Aorta: The aortic root and ascending aorta are structurally normal, with no evidence of dilitation.  Shunts: No atrial level shunt detected by color flow Doppler.   TRICUSPID VALVE             Normals TR Peak grad:   36.2 mmHg TR Vmax:        301.00 cm/s 288 cm/s   Kristeen Miss MD Electronically signed by Kristeen Miss MD Signature Date/Time: 09/26/2019/5:08:23 PM    Final            Risk Assessment/Calculations:    CHA2DS2-VASc Score = 2   This indicates a 2.2% annual risk of stroke. The patient's score is based upon: CHF History: 1 HTN History: 1 Diabetes History: 0 Stroke History: 0 Vascular Disease History: 0 Age Score: 0 Gender Score: 0    HYPERTENSION  CONTROL Vitals:   05/21/23 0931 05/21/23 1319  BP: (!) 140/88 (!) 156/78    The patient's blood pressure is elevated above target today.  In order to address the patient's elevated BP: A new medication was prescribed today.          Physical Exam:   VS:  BP (!) 156/78   Pulse 78   Ht 5\' 6"  (1.676 m)   Wt 246 lb 12.8 oz (111.9 kg)   SpO2 98%   BMI 39.83 kg/m    Wt Readings from Last 3 Encounters:  05/21/23 246 lb 12.8 oz (111.9 kg)  08/17/22 247 lb 6.4 oz (112.2 kg)  02/18/22 234 lb 3.2 oz (106.2 kg)    GEN: Well nourished, well developed in no acute distress NECK: No JVD; No carotid bruits CARDIAC: RRR, no murmurs, rubs, gallops RESPIRATORY:  Clear to auscultation without rales, wheezing or rhonchi  ABDOMEN: Soft, non-tender, non-distended EXTREMITIES:  No edema; No deformity   ASSESSMENT AND PLAN: .   HFrEF -most recent echo in October 2023 showed normalization of EF, grade 2 DD.  GDMT is prohibited secondary to kidney dysfunction.  Does not appear he is on a beta-blocker, unclear why, he is also very confused with the medications he is taking. Paroxysmal atrial fibrillation-s/p DCCV in 2020, he is in sinus rhythm today.  CHA2DS2-VASc score of 2, continue Eliquis 5 mg daily--no indication for dose reduction.  Denies hematochezia, hematuria, hemoptysis.  Repeat BMET, CBC. Hypertension-blood pressure is elevated today at 140/88, repeat pressure was elevated as well, we contacted his pharmacy and he was prescribed amlodipine however he has not picked it up and began to take it yet.  Will not make any medication changes and just advised him to pick up his amlodipine and start taking this daily. CKD - follows with [facility] and will see them in October of this year; most recent creatinine 2.08, GFR 37. Careful titration of diuretic and antihypertensive.         Dispo: CBC, BMET.  Return in 6 months.  Signed, Flossie Dibble, NP

## 2023-05-21 ENCOUNTER — Encounter: Payer: Self-pay | Admitting: Cardiology

## 2023-05-21 ENCOUNTER — Ambulatory Visit: Payer: PPO | Attending: Cardiology | Admitting: Cardiology

## 2023-05-21 VITALS — BP 156/78 | HR 78 | Ht 66.0 in | Wt 246.8 lb

## 2023-05-21 DIAGNOSIS — I480 Paroxysmal atrial fibrillation: Secondary | ICD-10-CM

## 2023-05-21 DIAGNOSIS — N184 Chronic kidney disease, stage 4 (severe): Secondary | ICD-10-CM | POA: Diagnosis not present

## 2023-05-21 DIAGNOSIS — I10 Essential (primary) hypertension: Secondary | ICD-10-CM | POA: Diagnosis not present

## 2023-05-21 DIAGNOSIS — I5020 Unspecified systolic (congestive) heart failure: Secondary | ICD-10-CM | POA: Diagnosis not present

## 2023-05-21 DIAGNOSIS — D6859 Other primary thrombophilia: Secondary | ICD-10-CM | POA: Diagnosis not present

## 2023-05-21 MED ORDER — AMLODIPINE BESYLATE 5 MG PO TABS: 5.0000 mg | ORAL_TABLET | Freq: Every day | ORAL | 3 refills | Status: DC

## 2023-05-21 NOTE — Patient Instructions (Signed)
Medication Instructions:   Your physician has recommended you make the following change in your medication:  Start taking your Amlodipine 5 mg once daily in am  *If you need a refill on your cardiac medications before your next appointment, please call your pharmacy*   Lab Work: Your physician recommends that you return for lab work in: Today for BMP and CBC  If you have labs (blood work) drawn today and your tests are completely normal, you will receive your results only by: MyChart Message (if you have MyChart) OR A paper copy in the mail If you have any lab test that is abnormal or we need to change your treatment, we will call you to review the results.   Testing/Procedures: NONE   Follow-Up: At [facility], you and your health needs are our priority.  As part of our continuing mission to provide you with exceptional heart care, we have created designated Provider Care Teams.  These Care Teams include your primary Cardiologist (physician) and Advanced Practice Providers (APPs -  Physician Assistants and Nurse Practitioners) who all work together to provide you with the care you need, when you need it.  We recommend signing up for the patient portal called "MyChart".  Sign up information is provided on this After Visit Summary.  MyChart is used to connect with patients for Virtual Visits (Telemedicine).  Patients are able to view lab/test results, encounter notes, upcoming appointments, etc.  Non-urgent messages can be sent to your provider as well.   To learn more about what you can do with MyChart, go to [email or web address].    Your next appointment:   6 month(s)  Provider:   Wallis Bamberg, NP [facility])    Other Instructions

## 2023-05-22 LAB — BASIC METABOLIC PANEL WITH GFR
BUN/Creatinine Ratio: 13 (ref 9–20)
BUN: 23 mg/dL (ref 6–24)
CO2: 24 mmol/L (ref 20–29)
Calcium: 9.3 mg/dL (ref 8.7–10.2)
Chloride: 101 mmol/L (ref 96–106)
Creatinine, Ser: 1.77 mg/dL — ABNORMAL HIGH (ref 0.76–1.27)
Glucose: 124 mg/dL — ABNORMAL HIGH (ref 70–99)
Potassium: 4.5 mmol/L (ref 3.5–5.2)
Sodium: 138 mmol/L (ref 134–144)
eGFR: 45 mL/min/1.73 — ABNORMAL LOW

## 2023-05-22 LAB — CBC WITH DIFFERENTIAL/PLATELET
Basophils Absolute: 0.1 x10E3/uL (ref 0.0–0.2)
Basos: 1 %
EOS (ABSOLUTE): 0.2 x10E3/uL (ref 0.0–0.4)
Eos: 2 %
Hematocrit: 41.2 % (ref 37.5–51.0)
Hemoglobin: 13.8 g/dL (ref 13.0–17.7)
Immature Grans (Abs): 0 x10E3/uL (ref 0.0–0.1)
Immature Granulocytes: 0 %
Lymphocytes Absolute: 1.5 x10E3/uL (ref 0.7–3.1)
Lymphs: 21 %
MCH: 28.7 pg (ref 26.6–33.0)
MCHC: 33.5 g/dL (ref 31.5–35.7)
MCV: 86 fL (ref 79–97)
Monocytes Absolute: 0.6 x10E3/uL (ref 0.1–0.9)
Monocytes: 8 %
Neutrophils Absolute: 4.9 x10E3/uL (ref 1.4–7.0)
Neutrophils: 68 %
Platelets: 423 x10E3/uL (ref 150–450)
RBC: 4.81 x10E6/uL (ref 4.14–5.80)
RDW: 13.2 % (ref 11.6–15.4)
WBC: 7.3 x10E3/uL (ref 3.4–10.8)

## 2023-05-25 ENCOUNTER — Telehealth: Payer: Self-pay | Admitting: *Deleted

## 2023-05-25 NOTE — Telephone Encounter (Signed)
-----   Message from Flossie Dibble, NP sent at 05/23/2023  5:20 PM EDT ----- Stable kidney dysfunction. Normal electrolytes. CBC with no evidence of anemia nor infection.  Good result!

## 2023-05-25 NOTE — Telephone Encounter (Signed)
Left message on voice mail to call us back.

## 2023-05-31 ENCOUNTER — Telehealth: Payer: Self-pay | Admitting: Cardiology

## 2023-05-31 NOTE — Telephone Encounter (Signed)
Patient number taken off chart

## 2023-05-31 NOTE — Telephone Encounter (Signed)
Caller reported telephone number - [phone] does not belong to the patient.

## 2023-05-31 NOTE — Telephone Encounter (Signed)
Left message for pt to call back for results.

## 2023-06-08 ENCOUNTER — Encounter: Payer: Self-pay | Admitting: *Deleted

## 2023-06-08 NOTE — Telephone Encounter (Signed)
3rd Attempt to contact by phone, will send labs in letter.

## 2023-07-20 DIAGNOSIS — F209 Schizophrenia, unspecified: Secondary | ICD-10-CM | POA: Diagnosis not present

## 2023-07-30 ENCOUNTER — Telehealth: Payer: Self-pay | Admitting: Cardiology

## 2023-07-30 ENCOUNTER — Telehealth: Payer: Self-pay

## 2023-07-30 NOTE — Telephone Encounter (Signed)
Spoke with pts sister Darlene per [employer]. She stated that the pt has had 2 minor dizzy spells and she would like to have him see someone. Message sent to front desk to make appt soon with MD or PA. Sister agreed and had no further questions.

## 2023-07-30 NOTE — Telephone Encounter (Signed)
Sister Agustin Cree) returned RN's call.

## 2023-07-30 NOTE — Telephone Encounter (Signed)
STAT if patient feels like he/she is going to faint   1. Are you feeling dizzy, lightheaded, or faint right now?   No   2. Have you passed out?  No  (If yes move to [facility])  3. Do you have any other symptoms?   No  4. Have you checked your HR and BP (record if available)?   BP 135/88 (yesterday)  Sister Agustin Cree) stated patient told her he had felt dizzy on Saturday and yesterday, episodes lasted a few minutes.

## 2023-07-30 NOTE — Telephone Encounter (Signed)
LVM for sister to call regarding message

## 2023-08-02 ENCOUNTER — Telehealth: Payer: Self-pay | Admitting: Cardiology

## 2023-08-02 NOTE — Telephone Encounter (Signed)
Pt's sister states that someone just called her trying to schedule an appt but I do not see the phone note. Please advise

## 2023-08-03 ENCOUNTER — Ambulatory Visit: Payer: PPO | Attending: Cardiology | Admitting: Cardiology

## 2023-08-03 ENCOUNTER — Encounter: Payer: Self-pay | Admitting: Cardiology

## 2023-08-03 VITALS — BP 131/77 | HR 68 | Ht 66.0 in | Wt 224.0 lb

## 2023-08-03 DIAGNOSIS — N1832 Chronic kidney disease, stage 3b: Secondary | ICD-10-CM

## 2023-08-03 DIAGNOSIS — Z7901 Long term (current) use of anticoagulants: Secondary | ICD-10-CM | POA: Diagnosis not present

## 2023-08-03 DIAGNOSIS — I480 Paroxysmal atrial fibrillation: Secondary | ICD-10-CM

## 2023-08-03 DIAGNOSIS — R42 Dizziness and giddiness: Secondary | ICD-10-CM

## 2023-08-03 DIAGNOSIS — I420 Dilated cardiomyopathy: Secondary | ICD-10-CM

## 2023-08-03 DIAGNOSIS — I10 Essential (primary) hypertension: Secondary | ICD-10-CM | POA: Diagnosis not present

## 2023-08-03 MED ORDER — MIDODRINE HCL 5 MG PO TABS: 5.0000 mg | ORAL_TABLET | Freq: Three times a day (TID) | ORAL | 3 refills | Status: DC

## 2023-08-03 NOTE — Telephone Encounter (Signed)
Patient's sister called, scheduled an appt for today at 2 PM 09/17  KBL  Thank you

## 2023-08-03 NOTE — Patient Instructions (Signed)
Medication Instructions:  Your physician has recommended you make the following change in your medication:   START: Midodrine 5 mg three times daily  *If you need a refill on your cardiac medications before your next appointment, please call your pharmacy*   Lab Work: Your physician recommends that you return for lab work in:   Labs in the AM: Fasting cortisol level, BMP  If you have labs (blood work) drawn today and your tests are completely normal, you will receive your results only by: MyChart Message (if you have MyChart) OR A paper copy in the mail If you have any lab test that is abnormal or we need to change your treatment, we will call you to review the results.   Testing/Procedures: None   Follow-Up: At [facility], you and your health needs are our priority.  As part of our continuing mission to provide you with exceptional heart care, we have created designated Provider Care Teams.  These Care Teams include your primary Cardiologist (physician) and Advanced Practice Providers (APPs -  Physician Assistants and Nurse Practitioners) who all work together to provide you with the care you need, when you need it.  We recommend signing up for the patient portal called "MyChart".  Sign up information is provided on this After Visit Summary.  MyChart is used to connect with patients for Virtual Visits (Telemedicine).  Patients are able to view lab/test results, encounter notes, upcoming appointments, etc.  Non-urgent messages can be sent to your provider as well.   To learn more about what you can do with MyChart, go to [email or web address].    Your next appointment:   3 week(s)  Provider:   Wallis Bamberg, NP [facility])    Other Instructions Check and record blood pressure both sitting and standing daily

## 2023-08-03 NOTE — Progress Notes (Signed)
Cardiology Office Note:    Date:  08/03/2023   ID:  Michael Mcknight, DOB 03/04/1966, MRN [id number]  PCP:  Ellis Parents, FNP  Cardiologist:  Norman Herrlich, MD    Referring MD: Ellis Parents, FNP    ASSESSMENT:    1. Dizzy   2. Paroxysmal atrial fibrillation (HCC)   3. Chronic anticoagulation   4. Dilated cardiomyopathy (HCC) ejection fraction 35% in November 2020   5. Benign essential HTN   6. Stage 3b chronic kidney disease (HCC)    PLAN:    In order of problems listed above:  He clearly has symptomatic orthostatic hypotension he has been off his antihypertensive agent 2 weeks initiate midodrine Sister with good healthcare literacy will check blood pressures daily sitting and standing return to the office in a few weeks to reassess Continues sleep with the head of the bed elevated to mitigate supine hypertension Continue his anticoagulant.   Next appointment: 2 to 3 weeks   Medication Adjustments/Labs and Tests Ordered: Current medicines are reviewed at length with the patient today.  Concerns regarding medicines are outlined above.  No orders of the defined types were placed in this encounter.  No orders of the defined types were placed in this encounter.    History of Present Illness:    Michael Mcknight is a 57 y.o. male with a hx of atrial fibrillation maintaining sinus rhythm on amiodarone at his last visit 05/21/2023 history of heart failure reduced ejection fraction most recently normalized 55 to 60% October 2023 hypertension stage IV CKD QT prolongation sleep apnea schizophrenia.  When last seen 05/21/2023 he was in sinus rhythm.  To my office hours this afternoon for complaint of dizziness.  Compliance with diet, lifestyle and medications: Yes  I know his family quite well and his sister is present with him 6 weeks ago he was put on amlodipine for high blood pressure 2 weeks ago he got out of the car stood up collapsed did not lose  consciousness. He saw his PCP blood pressure was 100 systolic and his amlodipine was discontinued at Since then he continues to have episodes of postural weakness dizziness but not as severe He is orthostatic in my office today He is not taking any antihypertensive agent he has no known history of adrenal disease and has not had neuropathy or spine injury  To mitigate symptoms start midodrine 5 mg 3 times daily fortunately he sleeps with the head elevated in a chair which helps to avoid supine hypertension. He has a sister with good healthcare knowledge we will check blood pressure daily sitting and standing return tomorrow for BMP and a fasting level and see Dr. Kirtland Bouchard or Victorino Dike 2 to 3 weeks Past Medical History:  Diagnosis Date   Atrial fibrillation/flutter (HCC) 09/22/2019   Atypical atrial flutter (HCC)    Benign essential HTN 09/22/2019   Cardiomegaly 09/22/2019   Cardiorenal syndrome with renal failure 06/26/2020   Chronic systolic congestive heart failure (HCC) 06/26/2020   CKD (chronic kidney disease) stage 4, GFR 15-29 ml/min (HCC) 06/26/2020   CKD (chronic kidney disease), stage III (HCC) 09/22/2019   Congestive heart failure due to cardiomyopathy [facility]) New York Heart Association class II 10/23/2019   Cyst of left kidney 10/04/2020   Dilated cardiomyopathy (HCC) ejection fraction 35% in November 2020 10/23/2019   Lobar pneumonia (HCC) 09/22/2019   Obesity (BMI 30-39.9) 06/26/2020   Pleural effusion 09/22/2019   Schizophrenia (HCC) 09/22/2019    Current Medications: Current  Meds  Medication Sig   acetaminophen (TYLENOL) 500 MG tablet Take 1,000 mg by mouth every 6 (six) hours as needed for moderate pain or headache.   amLODipine (NORVASC) 5 MG tablet Take 1 tablet (5 mg total) by mouth daily.   apixaban (ELIQUIS) 5 MG TABS tablet Take 1 tablet (5 mg total) by mouth 2 (two) times daily.   escitalopram (LEXAPRO) 10 MG tablet Take 10 mg by mouth daily.   risperidone (RISPERDAL) 4 MG tablet  Take 4 mg by mouth at bedtime.   trihexyphenidyl (ARTANE) 2 MG tablet Take 2 mg by mouth 2 (two) times daily.      EKGs/Labs/Other Studies Reviewed:    The following studies were reviewed today:         Recent Labs: 05/21/2023: BUN 23; Creatinine, Ser 1.77; Hemoglobin 13.8; Platelets 423; Potassium 4.5; Sodium 138  Recent Lipid Panel    Component Value Date/Time   CHOL 138 09/23/2019 0247   TRIG 84 09/23/2019 0247   HDL 35 (L) 09/23/2019 0247   CHOLHDL 3.9 09/23/2019 0247   VLDL 17 09/23/2019 0247   LDLCALC 86 09/23/2019 0247    Physical Exam:    VS:  BP 131/77   Pulse 68   Ht 5\' 6"  (1.676 m)   Wt 224 lb (101.6 kg)   SpO2 99%   BMI 36.15 kg/m     Wt Readings from Last 3 Encounters:  08/03/23 224 lb (101.6 kg)  05/21/23 246 lb 12.8 oz (111.9 kg)  08/17/22 247 lb 6.4 oz (112.2 kg)     GEN:  Well nourished, well developed in no acute distress HEENT: Normal NECK: No JVD; No carotid bruits LYMPHATICS: No lymphadenopathy CARDIAC: Cordate Cortizone RRR, no murmurs, rubs, gallops RESPIRATORY:  Clear to auscultation without rales, wheezing or rhonchi  ABDOMEN: Soft, non-tender, non-distended MUSCULOSKELETAL:  No edema; No deformity  SKIN: Warm and dry NEUROLOGIC:  Alert and oriented x 3 PSYCHIATRIC:  Normal affect    Signed, Norman Herrlich, MD  08/03/2023 2:20 PM    [facility]

## 2023-08-04 DIAGNOSIS — I480 Paroxysmal atrial fibrillation: Secondary | ICD-10-CM | POA: Diagnosis not present

## 2023-08-04 DIAGNOSIS — N1832 Chronic kidney disease, stage 3b: Secondary | ICD-10-CM | POA: Diagnosis not present

## 2023-08-04 DIAGNOSIS — I420 Dilated cardiomyopathy: Secondary | ICD-10-CM | POA: Diagnosis not present

## 2023-08-04 DIAGNOSIS — R42 Dizziness and giddiness: Secondary | ICD-10-CM | POA: Diagnosis not present

## 2023-08-04 DIAGNOSIS — Z7901 Long term (current) use of anticoagulants: Secondary | ICD-10-CM | POA: Diagnosis not present

## 2023-08-04 DIAGNOSIS — I10 Essential (primary) hypertension: Secondary | ICD-10-CM | POA: Diagnosis not present

## 2023-08-05 ENCOUNTER — Telehealth: Payer: Self-pay

## 2023-08-05 DIAGNOSIS — I10 Essential (primary) hypertension: Secondary | ICD-10-CM

## 2023-08-05 LAB — BASIC METABOLIC PANEL
BUN/Creatinine Ratio: 12 (ref 9–20)
BUN: 27 mg/dL — ABNORMAL HIGH (ref 6–24)
CO2: 19 mmol/L — ABNORMAL LOW (ref 20–29)
Calcium: 9.9 mg/dL (ref 8.7–10.2)
Chloride: 95 mmol/L — ABNORMAL LOW (ref 96–106)
Creatinine, Ser: 2.19 mg/dL — ABNORMAL HIGH (ref 0.76–1.27)
Glucose: 467 mg/dL — ABNORMAL HIGH (ref 70–99)
Potassium: 4.7 mmol/L (ref 3.5–5.2)
Sodium: 129 mmol/L — ABNORMAL LOW (ref 134–144)
eGFR: 34 mL/min/{1.73_m2} — ABNORMAL LOW (ref >59)

## 2023-08-05 LAB — CORTISOL: Cortisol: 16.6 ug/dL (ref 6.2–19.4)

## 2023-08-05 NOTE — Telephone Encounter (Signed)
MyChart msg I had stopped his antihypertensive medication with hypotension   Serum sodium was diminished restrict fluids 3 to 4 L/day should have a repeat BMP in about 2 weeks

## 2023-08-05 NOTE — Telephone Encounter (Signed)
-----   Message from [place] sent at 08/05/2023  7:29 AM EDT ----- I had stopped his antihypertensive medication with hypotension  Serum sodium was diminished restrict fluids 3 to 4 L/day should have a repeat BMP in about 2 weeks

## 2023-08-09 ENCOUNTER — Other Ambulatory Visit: Payer: Self-pay

## 2023-08-09 DIAGNOSIS — N184 Chronic kidney disease, stage 4 (severe): Secondary | ICD-10-CM

## 2023-08-23 DIAGNOSIS — N184 Chronic kidney disease, stage 4 (severe): Secondary | ICD-10-CM | POA: Diagnosis not present

## 2023-08-24 ENCOUNTER — Other Ambulatory Visit: Payer: Self-pay

## 2023-08-24 ENCOUNTER — Ambulatory Visit: Payer: PPO | Admitting: Cardiology

## 2023-08-24 DIAGNOSIS — N184 Chronic kidney disease, stage 4 (severe): Secondary | ICD-10-CM

## 2023-08-24 LAB — BASIC METABOLIC PANEL
BUN/Creatinine Ratio: 10 (ref 9–20)
BUN: 19 mg/dL (ref 6–24)
CO2: 20 mmol/L (ref 20–29)
Calcium: 10.2 mg/dL (ref 8.7–10.2)
Chloride: 99 mmol/L (ref 96–106)
Creatinine, Ser: 1.84 mg/dL — ABNORMAL HIGH (ref 0.76–1.27)
Glucose: 364 mg/dL — ABNORMAL HIGH (ref 70–99)
Potassium: 4.6 mmol/L (ref 3.5–5.2)
Sodium: 137 mmol/L (ref 134–144)
eGFR: 42 mL/min/{1.73_m2} — ABNORMAL LOW (ref >59)

## 2023-08-25 NOTE — Progress Notes (Signed)
2.15 cm/m   RA Area:     16.20 cm LA Vol (A2C):   63.0 ml 28.78 ml/m  RA Volume:   36.80 ml  16.81 ml/m LA Vol (A4C):   55.9 ml 25.54 ml/m LA Biplane Vol: 59.5 ml 27.18 ml/m AORTIC VALVE LVOT Vmax:   116.00 cm/s LVOT Vmean:  78.000 cm/s LVOT VTI:    0.260 m  AORTA Ao Root diam: 3.90 cm Ao Asc diam:  3.20 cm Ao Desc diam: 2.10 cm  MITRAL VALVE                TRICUSPID VALVE MV Area (PHT): 3.53 cm     TR Peak grad:   31.1 mmHg MV Decel Time: 215 msec     TR Vmax:        279.00 cm/s MR Peak grad: 102.4 mmHg MR Vmax:      506.00 cm/s   SHUNTS MV E velocity: 110.00 cm/s  Systemic VTI:  0.26 m MV A velocity: 87.30 cm/s   Systemic Diam: 1.90 cm MV E/A ratio:  1.26  Norman Herrlich MD Electronically signed by Norman Herrlich MD Signature Date/Time: 08/27/2022/1:52:35 PM    Final   TEE  ECHO TEE 09/26/2019  Narrative TRANSESOPHOGEAL ECHO REPORT    [name] Date of Exam: 09/26/2019 Medical Rec #:  [id number]        Height:       67.0 in Accession #:    [id number]       Weight:       231.0 lb Date of Birth:  [birth year]        BSA:          2.15 m Patient Age:    53 years         BP:           122/86 mmHg Patient Gender: M                HR:           120 bpm. Exam Location:  Inpatient   Procedure: Transesophageal Echo, Cardiac Doppler and Color Doppler  Indications:     I48.92* Unspecified atrial flutter  History:         Patient has no prior history of Echocardiogram examinations. Cardiomegaly; Arrythmias:Atrial Fibrillation and  Atrial Flutter.  Sonographer:     Sheralyn Boatman RDCS Referring Phys:  [id number] Tacey Ruiz DUNN Diagnosing Phys: Kristeen Miss MD    PROCEDURE: The TEE was followed by a successful Cardioversion. Patients was monitored while under deep sedation Anesthestetic sedation was provided intravenously by Anesthesiology: 250mg  of Propofol. The transesophogeal probe was passed through the esophogus of the patient. Imaged were obtained with the patient in a left lateral decubitus position. The patient developed no complications during the procedure.  IMPRESSIONS   1. Left ventricular ejection fraction, by visual estimation, is 30 to 35%. The left ventricle has moderate to severely decreased function. There is no left ventricular hypertrophy. 2. Global right ventricle has mildly reduced systolic function.The right ventricular size is normal. No increase in right ventricular wall thickness. 3. Left atrial size was normal. 4. No thrombi in the LA and LAA. 5. Right atrial size was normal. 6. The mitral valve is normal in structure. Mild to moderate mitral valve regurgitation. 7. The tricuspid valve is normal in structure. Tricuspid valve regurgitation moderate. 8. The aortic valve is normal in structure. Aortic valve regurgitation is trivial. 9. The pulmonic valve was  Cardiology Office Note:  .   Date:  08/25/2023  ID:  Michael Mcknight, DOB 28-Jun-1966, MRN [id number] PCP: Ellis Parents, FNP  [facility] Providers Cardiologist:  Norman Herrlich, MD    History of Present Illness: [name] is a 57 y.o. male with a past medical history of hypertension, atrial fibrillation, atrial flutter, HFrEF, CKD stage IV, prolonged QT interval, schizophrenia, OSA on CPAP.  08/27/2022 echo EF 55 to 60%, grade 2 DD, elevated LA pressure, mildly elevated PASP 05/14/2021 echo EF 55 to 60%, moderate concentric LVH, diastolic parameters were normal, no valvular abnormalities 09/26/2019 echo EF 30 to 35%, moderate to severely decreased LV function, no LVH, mild to moderate MR, moderate TR, moderately elevated PASP  Established care with Dr. Bing Matter in 2020 posthospitalization for newly diagnosed atrial fibrillation.  He had undergone a TEE and was cardioverted, EF 25 to 30%, GDMT prohibited secondary to kidney dysfunction.  Most recently was evaluated by Dr. Dulce Sellar on 08/03/2023 with complaints of dizziness.  He had been started on amlodipine approximately 6 weeks prior for hypertension, most recently he collapsed after getting out of a car but did not lose consciousness.  He was evaluated by his PCP his blood pressure was 100 systolic and his amlodipine was discontinued.  He continues to have episodes of postural dizziness and was orthostatic at this visit.  He was started on midodrine 5 mg 3 times daily.  His sodium was low at 129, he was advised to restrict his oral intake and repeat BMET was improved.  ROS: negative  Studies Reviewed: .        Cardiac Studies & Procedures       ECHOCARDIOGRAM  ECHOCARDIOGRAM COMPLETE 08/27/2022  Narrative ECHOCARDIOGRAM REPORT    [name] Date of Exam: 08/27/2022 Medical Rec #:  [id number]        Height:       66.0 in Accession #:    [id number]       Weight:       247.4 lb Date of  Birth:  Jun 12, 1966        BSA:          2.189 m Patient Age:    56 years         BP:           100/70 mmHg Patient Gender: M                HR:           65 bpm. Exam Location:  [place]  Procedure: 2D Echo, Cardiac Doppler, Color Doppler and Strain Analysis  Indications:    Dyspnea on exertion [R06.09 (ICD-10-CM)]  History:        Patient has prior history of Echocardiogram examinations, most recent 05/14/2021. CHF and Cardiomegaly, Arrythmias:Atrial Fibrillation and Atrial Flutter, Signs/Symptoms:Dyspnea; Risk Factors:Hypertension.  Sonographer:    Margreta Journey RDCS Referring Phys: [id number] ROBERT J KRASOWSKI  IMPRESSIONS   1. Left ventricular ejection fraction, by estimation, is 55 to 60%. The left ventricle has normal function. Left ventricular endocardial border not optimally defined to evaluate regional wall motion. Left ventricular diastolic parameters are consistent with Grade II diastolic dysfunction (pseudonormalization). Elevated left atrial pressure. The average left ventricular global longitudinal strain is -16.7 %. 2. Right ventricular systolic function is normal. The right ventricular size is normal. There is mildly elevated pulmonary artery systolic pressure. 3. A small pericardial effusion is present. The pericardial  Cardiology Office Note:  .   Date:  08/25/2023  ID:  Michael Mcknight, DOB 28-Jun-1966, MRN [id number] PCP: Ellis Parents, FNP  [facility] Providers Cardiologist:  Norman Herrlich, MD    History of Present Illness: [name] is a 57 y.o. male with a past medical history of hypertension, atrial fibrillation, atrial flutter, HFrEF, CKD stage IV, prolonged QT interval, schizophrenia, OSA on CPAP.  08/27/2022 echo EF 55 to 60%, grade 2 DD, elevated LA pressure, mildly elevated PASP 05/14/2021 echo EF 55 to 60%, moderate concentric LVH, diastolic parameters were normal, no valvular abnormalities 09/26/2019 echo EF 30 to 35%, moderate to severely decreased LV function, no LVH, mild to moderate MR, moderate TR, moderately elevated PASP  Established care with Dr. Bing Matter in 2020 posthospitalization for newly diagnosed atrial fibrillation.  He had undergone a TEE and was cardioverted, EF 25 to 30%, GDMT prohibited secondary to kidney dysfunction.  Most recently was evaluated by Dr. Dulce Sellar on 08/03/2023 with complaints of dizziness.  He had been started on amlodipine approximately 6 weeks prior for hypertension, most recently he collapsed after getting out of a car but did not lose consciousness.  He was evaluated by his PCP his blood pressure was 100 systolic and his amlodipine was discontinued.  He continues to have episodes of postural dizziness and was orthostatic at this visit.  He was started on midodrine 5 mg 3 times daily.  His sodium was low at 129, he was advised to restrict his oral intake and repeat BMET was improved.  ROS: negative  Studies Reviewed: .        Cardiac Studies & Procedures       ECHOCARDIOGRAM  ECHOCARDIOGRAM COMPLETE 08/27/2022  Narrative ECHOCARDIOGRAM REPORT    [name] Date of Exam: 08/27/2022 Medical Rec #:  [id number]        Height:       66.0 in Accession #:    [id number]       Weight:       247.4 lb Date of  Birth:  Jun 12, 1966        BSA:          2.189 m Patient Age:    56 years         BP:           100/70 mmHg Patient Gender: M                HR:           65 bpm. Exam Location:  [place]  Procedure: 2D Echo, Cardiac Doppler, Color Doppler and Strain Analysis  Indications:    Dyspnea on exertion [R06.09 (ICD-10-CM)]  History:        Patient has prior history of Echocardiogram examinations, most recent 05/14/2021. CHF and Cardiomegaly, Arrythmias:Atrial Fibrillation and Atrial Flutter, Signs/Symptoms:Dyspnea; Risk Factors:Hypertension.  Sonographer:    Margreta Journey RDCS Referring Phys: [id number] ROBERT J KRASOWSKI  IMPRESSIONS   1. Left ventricular ejection fraction, by estimation, is 55 to 60%. The left ventricle has normal function. Left ventricular endocardial border not optimally defined to evaluate regional wall motion. Left ventricular diastolic parameters are consistent with Grade II diastolic dysfunction (pseudonormalization). Elevated left atrial pressure. The average left ventricular global longitudinal strain is -16.7 %. 2. Right ventricular systolic function is normal. The right ventricular size is normal. There is mildly elevated pulmonary artery systolic pressure. 3. A small pericardial effusion is present. The pericardial  Cardiology Office Note:  .   Date:  08/25/2023  ID:  Michael Mcknight, DOB 28-Jun-1966, MRN [id number] PCP: Ellis Parents, FNP  [facility] Providers Cardiologist:  Norman Herrlich, MD    History of Present Illness: [name] is a 57 y.o. male with a past medical history of hypertension, atrial fibrillation, atrial flutter, HFrEF, CKD stage IV, prolonged QT interval, schizophrenia, OSA on CPAP.  08/27/2022 echo EF 55 to 60%, grade 2 DD, elevated LA pressure, mildly elevated PASP 05/14/2021 echo EF 55 to 60%, moderate concentric LVH, diastolic parameters were normal, no valvular abnormalities 09/26/2019 echo EF 30 to 35%, moderate to severely decreased LV function, no LVH, mild to moderate MR, moderate TR, moderately elevated PASP  Established care with Dr. Bing Matter in 2020 posthospitalization for newly diagnosed atrial fibrillation.  He had undergone a TEE and was cardioverted, EF 25 to 30%, GDMT prohibited secondary to kidney dysfunction.  Most recently was evaluated by Dr. Dulce Sellar on 08/03/2023 with complaints of dizziness.  He had been started on amlodipine approximately 6 weeks prior for hypertension, most recently he collapsed after getting out of a car but did not lose consciousness.  He was evaluated by his PCP his blood pressure was 100 systolic and his amlodipine was discontinued.  He continues to have episodes of postural dizziness and was orthostatic at this visit.  He was started on midodrine 5 mg 3 times daily.  His sodium was low at 129, he was advised to restrict his oral intake and repeat BMET was improved.  ROS: negative  Studies Reviewed: .        Cardiac Studies & Procedures       ECHOCARDIOGRAM  ECHOCARDIOGRAM COMPLETE 08/27/2022  Narrative ECHOCARDIOGRAM REPORT    [name] Date of Exam: 08/27/2022 Medical Rec #:  [id number]        Height:       66.0 in Accession #:    [id number]       Weight:       247.4 lb Date of  Birth:  Jun 12, 1966        BSA:          2.189 m Patient Age:    56 years         BP:           100/70 mmHg Patient Gender: M                HR:           65 bpm. Exam Location:  [place]  Procedure: 2D Echo, Cardiac Doppler, Color Doppler and Strain Analysis  Indications:    Dyspnea on exertion [R06.09 (ICD-10-CM)]  History:        Patient has prior history of Echocardiogram examinations, most recent 05/14/2021. CHF and Cardiomegaly, Arrythmias:Atrial Fibrillation and Atrial Flutter, Signs/Symptoms:Dyspnea; Risk Factors:Hypertension.  Sonographer:    Margreta Journey RDCS Referring Phys: [id number] ROBERT J KRASOWSKI  IMPRESSIONS   1. Left ventricular ejection fraction, by estimation, is 55 to 60%. The left ventricle has normal function. Left ventricular endocardial border not optimally defined to evaluate regional wall motion. Left ventricular diastolic parameters are consistent with Grade II diastolic dysfunction (pseudonormalization). Elevated left atrial pressure. The average left ventricular global longitudinal strain is -16.7 %. 2. Right ventricular systolic function is normal. The right ventricular size is normal. There is mildly elevated pulmonary artery systolic pressure. 3. A small pericardial effusion is present. The pericardial  Cardiology Office Note:  .   Date:  08/25/2023  ID:  Michael Mcknight, DOB 28-Jun-1966, MRN [id number] PCP: Ellis Parents, FNP  [facility] Providers Cardiologist:  Norman Herrlich, MD    History of Present Illness: [name] is a 57 y.o. male with a past medical history of hypertension, atrial fibrillation, atrial flutter, HFrEF, CKD stage IV, prolonged QT interval, schizophrenia, OSA on CPAP.  08/27/2022 echo EF 55 to 60%, grade 2 DD, elevated LA pressure, mildly elevated PASP 05/14/2021 echo EF 55 to 60%, moderate concentric LVH, diastolic parameters were normal, no valvular abnormalities 09/26/2019 echo EF 30 to 35%, moderate to severely decreased LV function, no LVH, mild to moderate MR, moderate TR, moderately elevated PASP  Established care with Dr. Bing Matter in 2020 posthospitalization for newly diagnosed atrial fibrillation.  He had undergone a TEE and was cardioverted, EF 25 to 30%, GDMT prohibited secondary to kidney dysfunction.  Most recently was evaluated by Dr. Dulce Sellar on 08/03/2023 with complaints of dizziness.  He had been started on amlodipine approximately 6 weeks prior for hypertension, most recently he collapsed after getting out of a car but did not lose consciousness.  He was evaluated by his PCP his blood pressure was 100 systolic and his amlodipine was discontinued.  He continues to have episodes of postural dizziness and was orthostatic at this visit.  He was started on midodrine 5 mg 3 times daily.  His sodium was low at 129, he was advised to restrict his oral intake and repeat BMET was improved.  ROS: negative  Studies Reviewed: .        Cardiac Studies & Procedures       ECHOCARDIOGRAM  ECHOCARDIOGRAM COMPLETE 08/27/2022  Narrative ECHOCARDIOGRAM REPORT    [name] Date of Exam: 08/27/2022 Medical Rec #:  [id number]        Height:       66.0 in Accession #:    [id number]       Weight:       247.4 lb Date of  Birth:  Jun 12, 1966        BSA:          2.189 m Patient Age:    56 years         BP:           100/70 mmHg Patient Gender: M                HR:           65 bpm. Exam Location:  [place]  Procedure: 2D Echo, Cardiac Doppler, Color Doppler and Strain Analysis  Indications:    Dyspnea on exertion [R06.09 (ICD-10-CM)]  History:        Patient has prior history of Echocardiogram examinations, most recent 05/14/2021. CHF and Cardiomegaly, Arrythmias:Atrial Fibrillation and Atrial Flutter, Signs/Symptoms:Dyspnea; Risk Factors:Hypertension.  Sonographer:    Margreta Journey RDCS Referring Phys: [id number] ROBERT J KRASOWSKI  IMPRESSIONS   1. Left ventricular ejection fraction, by estimation, is 55 to 60%. The left ventricle has normal function. Left ventricular endocardial border not optimally defined to evaluate regional wall motion. Left ventricular diastolic parameters are consistent with Grade II diastolic dysfunction (pseudonormalization). Elevated left atrial pressure. The average left ventricular global longitudinal strain is -16.7 %. 2. Right ventricular systolic function is normal. The right ventricular size is normal. There is mildly elevated pulmonary artery systolic pressure. 3. A small pericardial effusion is present. The pericardial

## 2023-08-26 ENCOUNTER — Encounter: Payer: Self-pay | Admitting: Cardiology

## 2023-08-26 ENCOUNTER — Ambulatory Visit: Payer: PPO | Attending: Cardiology | Admitting: Cardiology

## 2023-08-26 VITALS — BP 160/82 | Ht 66.0 in | Wt 220.0 lb

## 2023-08-26 DIAGNOSIS — I480 Paroxysmal atrial fibrillation: Secondary | ICD-10-CM | POA: Diagnosis not present

## 2023-08-26 DIAGNOSIS — Z7901 Long term (current) use of anticoagulants: Secondary | ICD-10-CM

## 2023-08-26 DIAGNOSIS — I5020 Unspecified systolic (congestive) heart failure: Secondary | ICD-10-CM

## 2023-08-26 DIAGNOSIS — I951 Orthostatic hypotension: Secondary | ICD-10-CM | POA: Diagnosis not present

## 2023-08-26 DIAGNOSIS — I10 Essential (primary) hypertension: Secondary | ICD-10-CM

## 2023-08-26 DIAGNOSIS — N184 Chronic kidney disease, stage 4 (severe): Secondary | ICD-10-CM | POA: Diagnosis not present

## 2023-08-26 MED ORDER — MIDODRINE HCL 2.5 MG PO TABS: 2.5000 mg | ORAL_TABLET | Freq: Three times a day (TID) | ORAL | 3 refills | Status: DC

## 2023-08-26 NOTE — Patient Instructions (Signed)
Medication Instructions:  Your physician has recommended you make the following change in your medication:  Reduce Midodrine to 2.5 mg three times daily  *If you need a refill on your cardiac medications before your next appointment, please call your pharmacy*   Lab Work: NONE If you have labs (blood work) drawn today and your tests are completely normal, you will receive your results only by: MyChart Message (if you have MyChart) OR A paper copy in the mail If you have any lab test that is abnormal or we need to change your treatment, we will call you to review the results.   Testing/Procedures: NONE   Follow-Up: At [facility], you and your health needs are our priority.  As part of our continuing mission to provide you with exceptional heart care, we have created designated Provider Care Teams.  These Care Teams include your primary Cardiologist (physician) and Advanced Practice Providers (APPs -  Physician Assistants and Nurse Practitioners) who all work together to provide you with the care you need, when you need it.  We recommend signing up for the patient portal called "MyChart".  Sign up information is provided on this After Visit Summary.  MyChart is used to connect with patients for Virtual Visits (Telemedicine).  Patients are able to view lab/test results, encounter notes, upcoming appointments, etc.  Non-urgent messages can be sent to your provider as well.   To learn more about what you can do with MyChart, go to [email or web address].    Your next appointment:   3 week(s)  Provider:   Wallis Bamberg, NP Rosalita Levan)    Other Instructions Continue checking BP once daily

## 2023-09-06 DIAGNOSIS — N184 Chronic kidney disease, stage 4 (severe): Secondary | ICD-10-CM | POA: Diagnosis not present

## 2023-09-06 DIAGNOSIS — L602 Onychogryphosis: Secondary | ICD-10-CM | POA: Diagnosis not present

## 2023-09-06 DIAGNOSIS — M79672 Pain in left foot: Secondary | ICD-10-CM | POA: Diagnosis not present

## 2023-09-19 NOTE — Progress Notes (Signed)
 Cardiology Office Note:  .   Date:  09/20/2023  ID:  Michael Mcknight, DOB May 21, 1966, MRN [id number] PCP: Ellis Parents, FNP  [facility] Providers Cardiologist:  Norman Herrlich, MD    History of Present Illness: Michael Mcknight is a 57 y.o. male with a past medical history of hypertension, atrial fibrillation, atrial flutter, HFrEF, CKD stage IV, prolonged QT interval, schizophrenia, OSA on CPAP.  08/27/2022 echo EF 55 to 60%, grade 2 DD, elevated LA pressure, mildly elevated PASP 05/14/2021 echo EF 55 to 60%, moderate concentric LVH, diastolic parameters were normal, no valvular abnormalities 09/26/2019 echo EF 30 to 35%, moderate to severely decreased LV function, no LVH, mild to moderate MR, moderate TR, moderately elevated PASP  Established care with Dr. Bing Matter in 2020 posthospitalization for newly diagnosed atrial fibrillation.  He had undergone a TEE and was cardioverted, EF 25 to 30%, GDMT prohibited secondary to kidney dysfunction.  Most recently was evaluated by Dr. Dulce Sellar on 08/03/2023 with complaints of dizziness.  He had been started on amlodipine approximately 6 weeks prior for hypertension, most recently he collapsed after getting out of a car but did not lose consciousness.  He was evaluated by his PCP his blood pressure was 100 systolic and his amlodipine was discontinued.  He continues to have episodes of postural dizziness and was orthostatic at this visit.  He was started on midodrine 5 mg 3 times daily.  His sodium was low at 129, he was advised to restrict his oral intake and repeat BMET was improved.  He presented on 08/26/2023 accompanied by sister for follow-up of his blood pressure, blood pressures daily elevated in the office and he was notably anxious.  Blood pressure log at home revealed his systolics are typically 120-150, we decrease his midodrine to 2.5 mg 3 times daily, advised him to keep a blood pressure log and return in 3 weeks.  He presents  today accompanied by his sister for follow-up of his hypertension, we have previously had him keep a blood pressure log at home however he inadvertently forgot this today, does state that his blood pressure readings are staying persistently in the 140-150 systolic range.  Offers no other formal complaints. He denies chest pain, palpitations, dyspnea, pnd, orthopnea, n, v, dizziness, syncope, edema, weight gain, or early satiety.   ROS: Review of Systems  All other systems reviewed and are negative.    Studies Reviewed: .        Cardiac Studies & Procedures       ECHOCARDIOGRAM  ECHOCARDIOGRAM COMPLETE 08/27/2022  Narrative ECHOCARDIOGRAM REPORT    [name] [place]on Date of Exam: 08/27/2022 Medical Rec #:  [id number]        Height:       66.0 in Accession #:    [id number]       Weight:       247.4 lb Date of Birth:  [birth year]        BSA:          2.189 m Patient Age:    56 years         BP:           100/70 mmHg Patient Gender: M                HR:           65 bpm. Exam Location:  [place]  Procedure: 2D Echo, Cardiac Doppler, Color Doppler and Strain  Analysis  Indications:    Dyspnea on exertion [R06.09 (ICD-10-CM)]  History:        Patient has prior history of Echocardiogram examinations, most recent 05/14/2021. CHF and Cardiomegaly, Arrythmias:Atrial Fibrillation and Atrial Flutter, Signs/Symptoms:Dyspnea; Risk Factors:Hypertension.  Sonographer:    Margreta Journey RDCS Referring Phys: [id number] ROBERT J KRASOWSKI  IMPRESSIONS   1. Left ventricular ejection fraction, by estimation, is 55 to 60%. The left ventricle has normal function. Left ventricular endocardial border not optimally defined to evaluate regional wall motion. Left ventricular diastolic parameters are consistent with Grade II diastolic dysfunction (pseudonormalization). Elevated left atrial pressure. The average left ventricular global longitudinal strain is -16.7 %. 2. Right ventricular  systolic function is normal. The right ventricular size is normal. There is mildly elevated pulmonary artery systolic pressure. 3. A small pericardial effusion is present. The pericardial effusion is posterior to the left ventricle. 4. The mitral valve is normal in structure. No evidence of mitral valve regurgitation. No evidence of mitral stenosis. 5. The aortic valve is tricuspid. Aortic valve regurgitation is not visualized. No aortic stenosis is present. 6. Aortic DTA is NWV.  FINDINGS Left Ventricle: Left ventricular ejection fraction, by estimation, is 55 to 60%. The left ventricle has normal function. Left ventricular endocardial border not optimally defined to evaluate regional wall motion. The average left ventricular global longitudinal strain is -16.7 %. The left ventricular internal cavity size was normal in size. There is no left ventricular hypertrophy. Left ventricular diastolic parameters are consistent with Grade II diastolic dysfunction (pseudonormalization). Elevated left atrial pressure.  Right Ventricle: The right ventricular size is normal. No increase in right ventricular wall thickness. Right ventricular systolic function is normal. There is mildly elevated pulmonary artery systolic pressure. The tricuspid regurgitant velocity is 2.79 m/s, and with an assumed right atrial pressure of 8 mmHg, the estimated right ventricular systolic pressure is 39.1 mmHg.  Left Atrium: Left atrial size was normal in size.  Right Atrium: Right atrial size was normal in size.  Pericardium: A small pericardial effusion is present. The pericardial effusion is posterior to the left ventricle.  Mitral Valve: The mitral valve is normal in structure. No evidence of mitral valve regurgitation. No evidence of mitral valve stenosis.  Tricuspid Valve: The tricuspid valve is normal in structure. Tricuspid valve regurgitation is mild . No evidence of tricuspid stenosis.  Aortic Valve: The aortic  valve is tricuspid. Aortic valve regurgitation is not visualized. No aortic stenosis is present.  Pulmonic Valve: The pulmonic valve was normal in structure. Pulmonic valve regurgitation is trivial. No evidence of pulmonic stenosis.  Aorta: The aortic root and ascending aorta are structurally normal, with no evidence of dilitation, the aortic arch was not well visualized, the aortic root is normal in size and structure and DTA is NWV.  Venous: A systolic blunting flow pattern is recorded from the right upper pulmonary vein. The inferior vena cava was not well visualized.  IAS/Shunts: No atrial level shunt detected by color flow Doppler.   LEFT VENTRICLE PLAX 2D LVIDd:         5.60 cm   Diastology LVIDs:         3.80 cm   LV e' medial:    6.31 cm/s LV PW:         1.10 cm   LV E/e' medial:  17.4 LV IVS:        1.10 cm   LV e' lateral:   8.38 cm/s LVOT diam:     1.90  cm   LV E/e' lateral: 13.1 LV SV:         74 LV SV Index:   34        2D Longitudinal Strain LVOT Area:     2.84 cm  2D Strain GLS Avg:     -16.7 %   RIGHT VENTRICLE             IVC RV Basal diam:  3.20 cm     IVC diam: 2.40 cm RV S prime:     12.30 cm/s TAPSE (M-mode): 3.1 cm  LEFT ATRIUM             Index        RIGHT ATRIUM           Index LA diam:        4.70 cm 2.15 cm/m   RA Area:     16.20 cm LA Vol (A2C):   63.0 ml 28.78 ml/m  RA Volume:   36.80 ml  16.81 ml/m LA Vol (A4C):   55.9 ml 25.54 ml/m LA Biplane Vol: 59.5 ml 27.18 ml/m AORTIC VALVE LVOT Vmax:   116.00 cm/s LVOT Vmean:  78.000 cm/s LVOT VTI:    0.260 m  AORTA Ao Root diam: 3.90 cm Ao Asc diam:  3.20 cm Ao Desc diam: 2.10 cm  MITRAL VALVE                TRICUSPID VALVE MV Area (PHT): 3.53 cm     TR Peak grad:   31.1 mmHg MV Decel Time: 215 msec     TR Vmax:        279.00 cm/s MR Peak grad: 102.4 mmHg MR Vmax:      506.00 cm/s   SHUNTS MV E velocity: 110.00 cm/s  Systemic VTI:  0.26 m MV A velocity: 87.30 cm/s   Systemic Diam: 1.90  cm MV E/A ratio:  1.26  Norman Herrlich MD Electronically signed by Norman Herrlich MD Signature Date/Time: 08/27/2022/1:52:35 PM    Final   TEE  ECHO TEE 09/26/2019  Narrative TRANSESOPHOGEAL ECHO REPORT    Patient Name:   MONNIE ANSPACH Susi Date of Exam: 09/26/2019 Medical Rec #:  [id number]        Height:       67.0 in Accession #:    [id number]       Weight:       231.0 lb Date of Birth:  1966/02/05        BSA:          2.15 m Patient Age:    53 years         BP:           122/86 mmHg Patient Gender: M                HR:           120 bpm. Exam Location:  Inpatient   Procedure: Transesophageal Echo, Cardiac Doppler and Color Doppler  Indications:     I48.92* Unspecified atrial flutter  History:         Patient has no prior history of Echocardiogram examinations. Cardiomegaly; Arrythmias:Atrial Fibrillation and Atrial Flutter.  Sonographer:     Sheralyn Boatman RDCS Referring Phys:  [id number] Tacey Ruiz DUNN Diagnosing Phys: Kristeen Miss MD    PROCEDURE: The TEE was followed by a successful Cardioversion. Patients was monitored while under deep sedation Anesthestetic sedation was provided intravenously by Anesthesiology: 250mg  of Propofol. The transesophogeal probe  was passed through the esophogus of the patient. Imaged were obtained with the patient in a left lateral decubitus position. The patient developed no complications during the procedure.  IMPRESSIONS   1. Left ventricular ejection fraction, by visual estimation, is 30 to 35%. The left ventricle has moderate to severely decreased function. There is no left ventricular hypertrophy. 2. Global right ventricle has mildly reduced systolic function.The right ventricular size is normal. No increase in right ventricular wall thickness. 3. Left atrial size was normal. 4. No thrombi in the LA and LAA. 5. Right atrial size was normal. 6. The mitral valve is normal in structure. Mild to moderate mitral valve regurgitation. 7. The  tricuspid valve is normal in structure. Tricuspid valve regurgitation moderate. 8. The aortic valve is normal in structure. Aortic valve regurgitation is trivial. 9. The pulmonic valve was normal in structure. Pulmonic valve regurgitation is trivial. 10. Moderately elevated pulmonary artery systolic pressure.  FINDINGS Left Ventricle: Left ventricular ejection fraction, by visual estimation, is 30 to 35%. The left ventricle has moderate to severely decreased function. There is no left ventricular hypertrophy.  Right Ventricle: The right ventricular size is normal. No increase in right ventricular wall thickness. Global RV systolic function is has mildly reduced systolic function. The tricuspid regurgitant velocity is 3.01 m/s, and with an assumed right atrial pressure of 15 mmHg, the estimated right ventricular systolic pressure is moderately elevated at 51.2 mmHg.  Left Atrium: Left atrial size was normal in size. No thrombi in the LA and LAA.  Right Atrium: Right atrial size was normal in size  Pericardium: There is no evidence of pericardial effusion.  Mitral Valve: The mitral valve is normal in structure. There is mild thickening of the mitral valve leaflet(s). Mild to moderate mitral valve regurgitation.  Tricuspid Valve: The tricuspid valve is normal in structure. Tricuspid valve regurgitation moderate.  Aortic Valve: The aortic valve is normal in structure. Aortic valve regurgitation is trivial.  Pulmonic Valve: The pulmonic valve was normal in structure. Pulmonic valve regurgitation is trivial.  Aorta: The aortic root and ascending aorta are structurally normal, with no evidence of dilitation.  Shunts: No atrial level shunt detected by color flow Doppler.   TRICUSPID VALVE             Normals TR Peak grad:   36.2 mmHg TR Vmax:        301.00 cm/s 288 cm/s   Kristeen Miss MD Electronically signed by Kristeen Miss MD Signature Date/Time: 09/26/2019/5:08:23 PM    Final             Risk Assessment/Calculations:    CHA2DS2-VASc Score = 2   This indicates a 2.2% annual risk of stroke. The patient's score is based upon: CHF History: 1 HTN History: 1 Diabetes History: 0 Stroke History: 0 Vascular Disease History: 0 Age Score: 0 Gender Score: 0    HYPERTENSION CONTROL Vitals:   09/20/23 0813 09/20/23 0902  BP: (!) 156/90 (!) 156/90    The patient's blood pressure is elevated above target today.  In order to address the patient's elevated BP: Blood pressure will be monitored at home to determine if medication changes need to be made.          Physical Exam:   VS:  BP (!) 156/90 Comment: visibly anxious and nervous  Pulse 72   Ht 5\' 6"  (1.676 m)   Wt 208 lb (94.3 kg)   SpO2 99%   BMI 33.57 kg/m    Wt  Readings from Last 3 Encounters:  09/20/23 208 lb (94.3 kg)  08/26/23 220 lb (99.8 kg)  08/03/23 224 lb (101.6 kg)    GEN: Well nourished, well developed in no acute distress--visibly anxious as MD offices make him nervous NECK: No JVD; No carotid bruits CARDIAC: RRR, no murmurs, rubs, gallops RESPIRATORY:  Clear to auscultation without rales, wheezing or rhonchi  ABDOMEN: Soft, non-tender, non-distended EXTREMITIES:  No edema; No deformity   ASSESSMENT AND PLAN: .   HFrEF -most recent echo in October 2023 showed normalization of EF, grade 2 DD.  GDMT is prohibited secondary to kidney dysfunction.  Does not appear he is on a beta-blocker, unclear why, he is also very confused with the medications he is taking. HR is 60 in the office today and he has been struggling with hypotension so we will not start today.   Paroxysmal atrial fibrillation-s/p DCCV in 2020, he is in sinus rhythm today.  CHA2DS2-VASc score of 2, continue Eliquis 5 mg daily--no indication for dose reduction.  Denies hematochezia, hematuria, hemoptysis.  Hgb and HCT on 05/21/23 without signs of anemia.   Hypertension/hypotension -he was started on midodrine 5 mg 3 times daily, we  decreased to 2.5 mg TID last OV, BP is still elevated and readings are elevated at home. Will decrease to 2.5 mg BID x 1 week, then once daily x 1 week. >> Nurse visit for BP check when his sister comes back for her testing on 10/04/23.   CKD - follows with [facility] and will see them in October of this year; most recent creatinine 2.08, GFR 37. Careful titration of diuretic and antihypertensive.         Dispo: Decreasing midodrine per above. Return in 2 weeks when his sister has an appointment and we will do a nurse visit on him for a BP check, if still elevated we will stop his midodrine and he will follow up with cardiology in 6 months.  Signed, Flossie Dibble, NP

## 2023-09-20 ENCOUNTER — Other Ambulatory Visit: Payer: Self-pay

## 2023-09-20 ENCOUNTER — Encounter: Payer: Self-pay | Admitting: Cardiology

## 2023-09-20 ENCOUNTER — Ambulatory Visit: Payer: PPO | Attending: Cardiology | Admitting: Cardiology

## 2023-09-20 VITALS — BP 156/90 | HR 72 | Ht 66.0 in | Wt 208.0 lb

## 2023-09-20 DIAGNOSIS — R42 Dizziness and giddiness: Secondary | ICD-10-CM

## 2023-09-20 DIAGNOSIS — Z7901 Long term (current) use of anticoagulants: Secondary | ICD-10-CM

## 2023-09-20 DIAGNOSIS — I951 Orthostatic hypotension: Secondary | ICD-10-CM | POA: Diagnosis not present

## 2023-09-20 DIAGNOSIS — I10 Essential (primary) hypertension: Secondary | ICD-10-CM | POA: Diagnosis not present

## 2023-09-20 DIAGNOSIS — I5020 Unspecified systolic (congestive) heart failure: Secondary | ICD-10-CM

## 2023-09-20 DIAGNOSIS — N184 Chronic kidney disease, stage 4 (severe): Secondary | ICD-10-CM | POA: Diagnosis not present

## 2023-09-20 DIAGNOSIS — I480 Paroxysmal atrial fibrillation: Secondary | ICD-10-CM | POA: Diagnosis not present

## 2023-09-20 MED ORDER — MIDODRINE HCL 2.5 MG PO TABS: ORAL_TABLET | ORAL | 3 refills | Status: DC

## 2023-09-20 NOTE — Patient Instructions (Signed)
Medication Instructions:  Your physician has recommended you make the following change in your medication:   START: Midodrine 2.5 mg daily (Take this medication twice daily for 1 week, then daily for 1 week)  *If you need a refill on your cardiac medications before your next appointment, please call your pharmacy*   Lab Work: None If you have labs (blood work) drawn today and your tests are completely normal, you will receive your results only by: MyChart Message (if you have MyChart) OR A paper copy in the mail If you have any lab test that is abnormal or we need to change your treatment, we will call you to review the results.   Testing/Procedures: None   Follow-Up: At [facility], you and your health needs are our priority.  As part of our continuing mission to provide you with exceptional heart care, we have created designated Provider Care Teams.  These Care Teams include your primary Cardiologist (physician) and Advanced Practice Providers (APPs -  Physician Assistants and Nurse Practitioners) who all work together to provide you with the care you need, when you need it.  We recommend signing up for the patient portal called "MyChart".  Sign up information is provided on this After Visit Summary.  MyChart is used to connect with patients for Virtual Visits (Telemedicine).  Patients are able to view lab/test results, encounter notes, upcoming appointments, etc.  Non-urgent messages can be sent to your provider as well.   To learn more about what you can do with MyChart, go to [email or web address].    Your next appointment:   6 month(s)  Provider:   Wallis Bamberg, NP [facility])    Other Instructions None

## 2023-09-29 DIAGNOSIS — I10 Essential (primary) hypertension: Secondary | ICD-10-CM | POA: Diagnosis not present

## 2023-09-29 DIAGNOSIS — R7309 Other abnormal glucose: Secondary | ICD-10-CM | POA: Diagnosis not present

## 2023-09-29 DIAGNOSIS — R634 Abnormal weight loss: Secondary | ICD-10-CM | POA: Diagnosis not present

## 2023-09-29 DIAGNOSIS — R42 Dizziness and giddiness: Secondary | ICD-10-CM | POA: Diagnosis not present

## 2023-09-29 DIAGNOSIS — N184 Chronic kidney disease, stage 4 (severe): Secondary | ICD-10-CM | POA: Diagnosis not present

## 2023-09-30 DIAGNOSIS — E1165 Type 2 diabetes mellitus with hyperglycemia: Secondary | ICD-10-CM | POA: Diagnosis not present

## 2023-09-30 DIAGNOSIS — Z885 Allergy status to narcotic agent status: Secondary | ICD-10-CM | POA: Diagnosis not present

## 2023-09-30 DIAGNOSIS — N189 Chronic kidney disease, unspecified: Secondary | ICD-10-CM | POA: Diagnosis not present

## 2023-09-30 DIAGNOSIS — E1122 Type 2 diabetes mellitus with diabetic chronic kidney disease: Secondary | ICD-10-CM | POA: Diagnosis not present

## 2023-09-30 DIAGNOSIS — D631 Anemia in chronic kidney disease: Secondary | ICD-10-CM | POA: Diagnosis not present

## 2023-09-30 DIAGNOSIS — I5022 Chronic systolic (congestive) heart failure: Secondary | ICD-10-CM | POA: Diagnosis not present

## 2023-09-30 DIAGNOSIS — F419 Anxiety disorder, unspecified: Secondary | ICD-10-CM | POA: Diagnosis not present

## 2023-09-30 DIAGNOSIS — Z794 Long term (current) use of insulin: Secondary | ICD-10-CM | POA: Diagnosis not present

## 2023-09-30 DIAGNOSIS — E7800 Pure hypercholesterolemia, unspecified: Secondary | ICD-10-CM | POA: Diagnosis not present

## 2023-09-30 DIAGNOSIS — N184 Chronic kidney disease, stage 4 (severe): Secondary | ICD-10-CM | POA: Diagnosis not present

## 2023-09-30 DIAGNOSIS — F200 Paranoid schizophrenia: Secondary | ICD-10-CM | POA: Diagnosis not present

## 2023-09-30 DIAGNOSIS — Z79899 Other long term (current) drug therapy: Secondary | ICD-10-CM | POA: Diagnosis not present

## 2023-09-30 DIAGNOSIS — Z7901 Long term (current) use of anticoagulants: Secondary | ICD-10-CM | POA: Diagnosis not present

## 2023-09-30 DIAGNOSIS — I2510 Atherosclerotic heart disease of native coronary artery without angina pectoris: Secondary | ICD-10-CM | POA: Diagnosis not present

## 2023-09-30 DIAGNOSIS — I252 Old myocardial infarction: Secondary | ICD-10-CM | POA: Diagnosis not present

## 2023-09-30 DIAGNOSIS — I482 Chronic atrial fibrillation, unspecified: Secondary | ICD-10-CM | POA: Diagnosis not present

## 2023-09-30 DIAGNOSIS — M109 Gout, unspecified: Secondary | ICD-10-CM | POA: Diagnosis not present

## 2023-09-30 DIAGNOSIS — I10 Essential (primary) hypertension: Secondary | ICD-10-CM | POA: Diagnosis not present

## 2023-09-30 DIAGNOSIS — I429 Cardiomyopathy, unspecified: Secondary | ICD-10-CM | POA: Diagnosis not present

## 2023-10-01 DIAGNOSIS — E1165 Type 2 diabetes mellitus with hyperglycemia: Secondary | ICD-10-CM | POA: Diagnosis not present

## 2023-10-01 DIAGNOSIS — I252 Old myocardial infarction: Secondary | ICD-10-CM | POA: Diagnosis not present

## 2023-10-01 DIAGNOSIS — N184 Chronic kidney disease, stage 4 (severe): Secondary | ICD-10-CM | POA: Diagnosis not present

## 2023-10-01 DIAGNOSIS — N189 Chronic kidney disease, unspecified: Secondary | ICD-10-CM | POA: Diagnosis not present

## 2023-10-01 DIAGNOSIS — I5022 Chronic systolic (congestive) heart failure: Secondary | ICD-10-CM | POA: Diagnosis not present

## 2023-10-01 DIAGNOSIS — I10 Essential (primary) hypertension: Secondary | ICD-10-CM | POA: Diagnosis not present

## 2023-10-01 LAB — BASIC METABOLIC PANEL: EGFR: 37

## 2023-10-04 ENCOUNTER — Ambulatory Visit: Payer: PPO

## 2023-10-07 DIAGNOSIS — F209 Schizophrenia, unspecified: Secondary | ICD-10-CM | POA: Diagnosis not present

## 2023-10-07 DIAGNOSIS — Z0389 Encounter for observation for other suspected diseases and conditions ruled out: Secondary | ICD-10-CM | POA: Diagnosis not present

## 2023-10-07 DIAGNOSIS — F840 Autistic disorder: Secondary | ICD-10-CM | POA: Diagnosis not present

## 2023-10-07 DIAGNOSIS — N189 Chronic kidney disease, unspecified: Secondary | ICD-10-CM | POA: Diagnosis not present

## 2023-10-07 DIAGNOSIS — E119 Type 2 diabetes mellitus without complications: Secondary | ICD-10-CM | POA: Diagnosis not present

## 2023-10-07 DIAGNOSIS — R42 Dizziness and giddiness: Secondary | ICD-10-CM | POA: Diagnosis not present

## 2023-10-07 DIAGNOSIS — I509 Heart failure, unspecified: Secondary | ICD-10-CM | POA: Diagnosis not present

## 2023-10-07 DIAGNOSIS — Z1331 Encounter for screening for depression: Secondary | ICD-10-CM | POA: Diagnosis not present

## 2023-10-07 DIAGNOSIS — R7981 Abnormal blood-gas level: Secondary | ICD-10-CM | POA: Diagnosis not present

## 2023-10-07 DIAGNOSIS — Z794 Long term (current) use of insulin: Secondary | ICD-10-CM | POA: Diagnosis not present

## 2023-10-07 DIAGNOSIS — I951 Orthostatic hypotension: Secondary | ICD-10-CM | POA: Diagnosis not present

## 2023-10-07 DIAGNOSIS — R296 Repeated falls: Secondary | ICD-10-CM | POA: Diagnosis not present

## 2023-10-07 DIAGNOSIS — Z7901 Long term (current) use of anticoagulants: Secondary | ICD-10-CM | POA: Diagnosis not present

## 2023-10-07 DIAGNOSIS — Z79899 Other long term (current) drug therapy: Secondary | ICD-10-CM | POA: Diagnosis not present

## 2023-10-07 DIAGNOSIS — E1122 Type 2 diabetes mellitus with diabetic chronic kidney disease: Secondary | ICD-10-CM | POA: Diagnosis not present

## 2023-10-07 DIAGNOSIS — I480 Paroxysmal atrial fibrillation: Secondary | ICD-10-CM | POA: Diagnosis not present

## 2023-10-07 DIAGNOSIS — R6889 Other general symptoms and signs: Secondary | ICD-10-CM | POA: Diagnosis not present

## 2023-10-09 NOTE — Progress Notes (Unsigned)
Cardiology Office Note:  .   Date:  10/11/2023  ID:  Michael Mcknight, DOB November 06, 1966, MRN [id number] PCP: Ellis Parents, FNP  [facility] Providers Cardiologist:  Norman Herrlich, MD    History of Present Illness: Michael Mcknight is a 57 y.o. male with a past medical history of hypertension, atrial fibrillation, atrial flutter, HFrEF, CKD stage IV, prolonged QT interval, schizophrenia, OSA on CPAP, DM2.  08/27/2022 echo EF 55 to 60%, grade 2 DD, elevated LA pressure, mildly elevated PASP 05/14/2021 echo EF 55 to 60%, moderate concentric LVH, diastolic parameters were normal, no valvular abnormalities 09/26/2019 echo EF 30 to 35%, moderate to severely decreased LV function, no LVH, mild to moderate MR, moderate TR, moderately elevated PASP 2020 DCCV   Evaluated by Dr. Dulce Sellar on 08/03/2023 with complaints of dizziness.  He had been started on amlodipine approximately 6 weeks prior for hypertension, most recently he collapsed after getting out of a car but did not lose consciousness.  He was evaluated by his PCP his blood pressure was 100 systolic and his amlodipine was discontinued.  He continues to have episodes of postural dizziness and was orthostatic at this visit.  He was started on midodrine 5 mg 3 times daily.  His sodium was low at 129, he was advised to restrict his oral intake and repeat BMET was improved.  Previously evaluated for his hypertension, BP was staying persistently in the 140-150 systolic range, he was bothered by dizziness and we decreased his midodrine. He was admitted to [facility] on 09/30/2023 for hyperglycemia.  He had been bothered by dizziness for a few weeks, his blood sugar in his PCP office was greater than 500 and he was evaluated to go to the emergency department, A1c was 15.5%.  Creatinine 1.90, potassium 4.3, sodium 136.   Today, he is accompanied by his sister who keeps detailed records of his health. He is adjusting to newly diagnosed DM,  bothered by dizziness. He denies chest pain, palpitations, dyspnea, pnd, orthopnea, n, v, dizziness, syncope, edema, weight gain, or early satiety.    ROS: Review of Systems  Neurological:  Positive for dizziness.  All other systems reviewed and are negative.    Studies Reviewed: .        Cardiac Studies & Procedures       ECHOCARDIOGRAM  ECHOCARDIOGRAM COMPLETE 08/27/2022  Narrative ECHOCARDIOGRAM REPORT    [name] Date of Exam: 08/27/2022 Medical Rec #:  [id number]        Height:       66.0 in Accession #:    [id number]       Weight:       247.4 lb Date of Birth:  January 07, 1966        BSA:          2.189 m Patient Age:    56 years         BP:           100/70 mmHg Patient Gender: M                HR:           65 bpm. Exam Location:  [place]  Procedure: 2D Echo, Cardiac Doppler, Color Doppler and Strain Analysis  Indications:    Dyspnea on exertion [R06.09 (ICD-10-CM)]  History:        Patient has prior history of Echocardiogram examinations, most recent 05/14/2021. CHF and Cardiomegaly, Arrythmias:Atrial Fibrillation  and Atrial Flutter, Signs/Symptoms:Dyspnea; Risk Factors:Hypertension.  Sonographer:    Margreta Journey RDCS Referring Phys: [id number] ROBERT J KRASOWSKI  IMPRESSIONS   1. Left ventricular ejection fraction, by estimation, is 55 to 60%. The left ventricle has normal function. Left ventricular endocardial border not optimally defined to evaluate regional wall motion. Left ventricular diastolic parameters are consistent with Grade II diastolic dysfunction (pseudonormalization). Elevated left atrial pressure. The average left ventricular global longitudinal strain is -16.7 %. 2. Right ventricular systolic function is normal. The right ventricular size is normal. There is mildly elevated pulmonary artery systolic pressure. 3. A small pericardial effusion is present. The pericardial effusion is posterior to the left ventricle. 4. The  mitral valve is normal in structure. No evidence of mitral valve regurgitation. No evidence of mitral stenosis. 5. The aortic valve is tricuspid. Aortic valve regurgitation is not visualized. No aortic stenosis is present. 6. Aortic DTA is NWV.  FINDINGS Left Ventricle: Left ventricular ejection fraction, by estimation, is 55 to 60%. The left ventricle has normal function. Left ventricular endocardial border not optimally defined to evaluate regional wall motion. The average left ventricular global longitudinal strain is -16.7 %. The left ventricular internal cavity size was normal in size. There is no left ventricular hypertrophy. Left ventricular diastolic parameters are consistent with Grade II diastolic dysfunction (pseudonormalization). Elevated left atrial pressure.  Right Ventricle: The right ventricular size is normal. No increase in right ventricular wall thickness. Right ventricular systolic function is normal. There is mildly elevated pulmonary artery systolic pressure. The tricuspid regurgitant velocity is 2.79 m/s, and with an assumed right atrial pressure of 8 mmHg, the estimated right ventricular systolic pressure is 39.1 mmHg.  Left [facility]: Left atrial size was normal in size.  Right [facility]: Right atrial size was normal in size.  Pericardium: A small pericardial effusion is present. The pericardial effusion is posterior to the left ventricle.  Mitral Valve: The mitral valve is normal in structure. No evidence of mitral valve regurgitation. No evidence of mitral valve stenosis.  Tricuspid Valve: The tricuspid valve is normal in structure. Tricuspid valve regurgitation is mild . No evidence of tricuspid stenosis.  Aortic Valve: The aortic valve is tricuspid. Aortic valve regurgitation is not visualized. No aortic stenosis is present.  Pulmonic Valve: The pulmonic valve was normal in structure. Pulmonic valve regurgitation is trivial. No evidence of pulmonic stenosis.  Aorta:  The aortic root and ascending aorta are structurally normal, with no evidence of dilitation, the aortic arch was not well visualized, the aortic root is normal in size and structure and DTA is NWV.  Venous: A systolic blunting flow pattern is recorded from the right upper pulmonary vein. The inferior vena cava was not well visualized.  IAS/Shunts: No atrial level shunt detected by color flow Doppler.   LEFT VENTRICLE PLAX 2D LVIDd:         5.60 cm   Diastology LVIDs:         3.80 cm   LV e' medial:    6.31 cm/s LV PW:         1.10 cm   LV E/e' medial:  17.4 LV IVS:        1.10 cm   LV e' lateral:   8.38 cm/s LVOT diam:     1.90 cm   LV E/e' lateral: 13.1 LV SV:         74 LV SV Index:   34        2D Longitudinal Strain LVOT  Area:     2.84 cm  2D Strain GLS Avg:     -16.7 %   RIGHT VENTRICLE             IVC RV Basal diam:  3.20 cm     IVC diam: 2.40 cm RV S prime:     12.30 cm/s TAPSE (M-mode): 3.1 cm  LEFT ATRIUM             Index        RIGHT ATRIUM           Index LA diam:        4.70 cm 2.15 cm/m   RA Area:     16.20 cm LA Vol (A2C):   63.0 ml 28.78 ml/m  RA Volume:   36.80 ml  16.81 ml/m LA Vol (A4C):   55.9 ml 25.54 ml/m LA Biplane Vol: 59.5 ml 27.18 ml/m AORTIC VALVE LVOT Vmax:   116.00 cm/s LVOT Vmean:  78.000 cm/s LVOT VTI:    0.260 m  AORTA Ao Root diam: 3.90 cm Ao Asc diam:  3.20 cm Ao Desc diam: 2.10 cm  MITRAL VALVE                TRICUSPID VALVE MV Area (PHT): 3.53 cm     TR Peak grad:   31.1 mmHg MV Decel Time: 215 msec     TR Vmax:        279.00 cm/s MR Peak grad: 102.4 mmHg MR Vmax:      506.00 cm/s   SHUNTS MV E velocity: 110.00 cm/s  Systemic VTI:  0.26 m MV A velocity: 87.30 cm/s   Systemic Diam: 1.90 cm MV E/A ratio:  1.26  Norman Herrlich MD Electronically signed by Norman Herrlich MD Signature Date/Time: 08/27/2022/1:52:35 PM    Final   TEE  ECHO TEE 09/26/2019  Narrative TRANSESOPHOGEAL ECHO REPORT    Patient Name:   Michael MICHIELS  Mcknight Date of Exam: 09/26/2019 Medical Rec #:  [id number]        Height:       67.0 in Accession #:    [id number]       Weight:       231.0 lb Date of Birth:  1965-12-22        BSA:          2.15 m Patient Age:    57 years         BP:           122/86 mmHg Patient Gender: M                HR:           120 bpm. Exam Location:  Inpatient   Procedure: Transesophageal Echo, Cardiac Doppler and Color Doppler  Indications:     I48.92* Unspecified atrial flutter  History:         Patient has no prior history of Echocardiogram examinations. Cardiomegaly; Arrythmias:Atrial Fibrillation and Atrial Flutter.  Sonographer:     Sheralyn Boatman RDCS Referring Phys:  [id number] Tacey Ruiz DUNN Diagnosing Phys: Kristeen Miss MD    PROCEDURE: The TEE was followed by a successful Cardioversion. Patients was monitored while under deep sedation Anesthestetic sedation was provided intravenously by Anesthesiology: 250mg  of Propofol. The transesophogeal probe was passed through the esophogus of the patient. Imaged were obtained with the patient in a left lateral decubitus position. The patient developed no complications during the procedure.  IMPRESSIONS   1. Left ventricular  ejection fraction, by visual estimation, is 30 to 35%. The left ventricle has moderate to severely decreased function. There is no left ventricular hypertrophy. 2. Global right ventricle has mildly reduced systolic function.The right ventricular size is normal. No increase in right ventricular wall thickness. 3. Left atrial size was normal. 4. No thrombi in the LA and LAA. 5. Right atrial size was normal. 6. The mitral valve is normal in structure. Mild to moderate mitral valve regurgitation. 7. The tricuspid valve is normal in structure. Tricuspid valve regurgitation moderate. 8. The aortic valve is normal in structure. Aortic valve regurgitation is trivial. 9. The pulmonic valve was normal in structure. Pulmonic valve regurgitation is  trivial. 10. Moderately elevated pulmonary artery systolic pressure.  FINDINGS Left Ventricle: Left ventricular ejection fraction, by visual estimation, is 30 to 35%. The left ventricle has moderate to severely decreased function. There is no left ventricular hypertrophy.  Right Ventricle: The right ventricular size is normal. No increase in right ventricular wall thickness. Global RV systolic function is has mildly reduced systolic function. The tricuspid regurgitant velocity is 3.01 m/s, and with an assumed right atrial pressure of 15 mmHg, the estimated right ventricular systolic pressure is moderately elevated at 51.2 mmHg.  Left [facility]: Left atrial size was normal in size. No thrombi in the LA and LAA.  Right [facility]: Right atrial size was normal in size  Pericardium: There is no evidence of pericardial effusion.  Mitral Valve: The mitral valve is normal in structure. There is mild thickening of the mitral valve leaflet(s). Mild to moderate mitral valve regurgitation.  Tricuspid Valve: The tricuspid valve is normal in structure. Tricuspid valve regurgitation moderate.  Aortic Valve: The aortic valve is normal in structure. Aortic valve regurgitation is trivial.  Pulmonic Valve: The pulmonic valve was normal in structure. Pulmonic valve regurgitation is trivial.  Aorta: The aortic root and ascending aorta are structurally normal, with no evidence of dilitation.  Shunts: No atrial level shunt detected by color flow Doppler.   TRICUSPID VALVE             Normals TR Peak grad:   36.2 mmHg TR Vmax:        301.00 cm/s 288 cm/s   Kristeen Miss MD Electronically signed by Kristeen Miss MD Signature Date/Time: 09/26/2019/5:08:23 PM    Final            Risk Assessment/Calculations:    CHA2DS2-VASc Score = 2   This indicates a 2.2% annual risk of stroke. The patient's score is based upon: CHF History: 1 HTN History: 1 Diabetes History: 0 Stroke History: 0 Vascular Disease  History: 0 Age Score: 0 Gender Score: 0            Physical Exam:   VS:  BP (!) 80/50   Pulse 77   Ht 5\' 6"  (1.676 m)   Wt 204 lb 6.6 oz (92.7 kg)   SpO2 96%   BMI 32.99 kg/m    Wt Readings from Last 3 Encounters:  10/11/23 204 lb 6.6 oz (92.7 kg)  09/20/23 208 lb (94.3 kg)  08/26/23 220 lb (99.8 kg)    GEN: Well nourished, well developed in no acute distress--visibly anxious as MD offices make him nervous NECK: No JVD; No carotid bruits CARDIAC: RRR, no murmurs, rubs, gallops RESPIRATORY:  Clear to auscultation without rales, wheezing or rhonchi  ABDOMEN: Soft, non-tender, non-distended EXTREMITIES:  No edema; No deformity   ASSESSMENT AND PLAN: .   HFrEF - NYHA class I,  euvolemic, most recent echo in October 2023 showed normalization of EF, grade 2 DD.  GDMT is prohibited secondary to kidney dysfunction.  Not on BB secondary to ongoing dizziness.   Dizziness - likely multifactorial r/t hypotension, new onset DM and medication use. Recent labs in the hospital without other contributory causes.   DM2 - new onset, started Lantus last week, blood sugar this am was well controlled at 106.   Paroxysmal atrial fibrillation-s/p DCCV in 2020, he is in sinus rhythm today.  CHA2DS2-VASc score of 2, continue Eliquis 5 mg daily--no indication for dose reduction.  Denies hematochezia, hematuria, hemoptysis.    Hypotension - could be related to dysautonomia, increase midodrine to 5 mg TID, start fludrocortisone 0.1 mg daily. We will call to check on how he is feeling in ~ 2 weeks. We discussed compression socks and abdominal binder, but he would not be able to use this.   CKD - follows with [facility] and will see them in October of this year; most recent creatinine 2.08, GFR 37. Careful titration of diuretic and antihypertensive.    Schizophrenia - on Risperdal and Artane, both can cause dizziness       Dispo: Increase midodrine to 5 mg TID, start fludrocortisone 0.1 mg daily.  We will call to check on him ~ 2 weeks. Follow up with Dr. Dulce Sellar in 1 month.   Signed, Flossie Dibble, NP

## 2023-10-11 ENCOUNTER — Encounter: Payer: Self-pay | Admitting: Cardiology

## 2023-10-11 ENCOUNTER — Ambulatory Visit: Payer: PPO | Attending: Cardiology | Admitting: Cardiology

## 2023-10-11 VITALS — BP 80/50 | HR 77 | Ht 66.0 in | Wt 204.4 lb

## 2023-10-11 DIAGNOSIS — N184 Chronic kidney disease, stage 4 (severe): Secondary | ICD-10-CM | POA: Diagnosis not present

## 2023-10-11 DIAGNOSIS — I480 Paroxysmal atrial fibrillation: Secondary | ICD-10-CM

## 2023-10-11 DIAGNOSIS — I5020 Unspecified systolic (congestive) heart failure: Secondary | ICD-10-CM | POA: Diagnosis not present

## 2023-10-11 DIAGNOSIS — E119 Type 2 diabetes mellitus without complications: Secondary | ICD-10-CM | POA: Diagnosis not present

## 2023-10-11 DIAGNOSIS — D6859 Other primary thrombophilia: Secondary | ICD-10-CM | POA: Diagnosis not present

## 2023-10-11 DIAGNOSIS — I10 Essential (primary) hypertension: Secondary | ICD-10-CM | POA: Diagnosis not present

## 2023-10-11 DIAGNOSIS — I950 Idiopathic hypotension: Secondary | ICD-10-CM | POA: Diagnosis not present

## 2023-10-11 MED ORDER — FLUDROCORTISONE ACETATE 0.1 MG PO TABS: 0.1000 mg | ORAL_TABLET | Freq: Every day | ORAL | 3 refills | Status: DC

## 2023-10-11 MED ORDER — MIDODRINE HCL 5 MG PO TABS: 5.0000 mg | ORAL_TABLET | Freq: Three times a day (TID) | ORAL | 5 refills | Status: DC

## 2023-10-11 NOTE — Patient Instructions (Addendum)
Medication Instructions:  Your physician has recommended you make the following change in your medication:  Increase Midodrine to 5 mg three times daily Start Fludrocortisone 0.1 mg once daily  *If you need a refill on your cardiac medications before your next appointment, please call your pharmacy*   Lab Work: NONE If you have labs (blood work) drawn today and your tests are completely normal, you will receive your results only by: MyChart Message (if you have MyChart) OR A paper copy in the mail If you have any lab test that is abnormal or we need to change your treatment, we will call you to review the results.   Testing/Procedures: NONE   Follow-Up: At [facility], you and your health needs are our priority.  As part of our continuing mission to provide you with exceptional heart care, we have created designated Provider Care Teams.  These Care Teams include your primary Cardiologist (physician) and Advanced Practice Providers (APPs -  Physician Assistants and Nurse Practitioners) who all work together to provide you with the care you need, when you need it.  We recommend signing up for the patient portal called "MyChart".  Sign up information is provided on this After Visit Summary.  MyChart is used to connect with patients for Virtual Visits (Telemedicine).  Patients are able to view lab/test results, encounter notes, upcoming appointments, etc.  Non-urgent messages can be sent to your provider as well.   To learn more about what you can do with MyChart, go to [email or web address].    Your next appointment:   1 month(s)  Provider:   Norman Herrlich, MD    Other Instructions m

## 2023-10-13 DIAGNOSIS — E119 Type 2 diabetes mellitus without complications: Secondary | ICD-10-CM | POA: Diagnosis not present

## 2023-10-13 DIAGNOSIS — G8929 Other chronic pain: Secondary | ICD-10-CM | POA: Diagnosis not present

## 2023-10-13 DIAGNOSIS — M109 Gout, unspecified: Secondary | ICD-10-CM | POA: Diagnosis not present

## 2023-10-13 DIAGNOSIS — M79671 Pain in right foot: Secondary | ICD-10-CM | POA: Diagnosis not present

## 2023-10-13 DIAGNOSIS — R42 Dizziness and giddiness: Secondary | ICD-10-CM | POA: Diagnosis not present

## 2023-10-25 ENCOUNTER — Telehealth: Payer: Self-pay | Admitting: *Deleted

## 2023-10-25 NOTE — Telephone Encounter (Signed)
Left message for pt to call us back and let us know how he is feeling.

## 2023-10-26 DIAGNOSIS — F209 Schizophrenia, unspecified: Secondary | ICD-10-CM | POA: Diagnosis not present

## 2023-10-27 NOTE — Telephone Encounter (Signed)
Darlene, pt's sister returned call about pt. She stated he has been about the same and this morning was especially bad with the dizziness. Pt has appt on 12/30 with Dr. Dulce Sellar and they will let us know if he gets worse.

## 2023-11-02 ENCOUNTER — Emergency Department
Admission: EM | Admit: 2023-11-02 | Discharge: 2023-11-02 | Disposition: A | Discharge status: Home / Self Care | Payer: PPO | Attending: Emergency Medicine | Admitting: Emergency Medicine

## 2023-11-02 ENCOUNTER — Other Ambulatory Visit: Payer: Self-pay

## 2023-11-02 ENCOUNTER — Other Ambulatory Visit: Payer: Self-pay | Admitting: Cardiology

## 2023-11-02 ENCOUNTER — Telehealth: Payer: Self-pay | Admitting: *Deleted

## 2023-11-02 DIAGNOSIS — I130 Hypertensive heart and chronic kidney disease with heart failure and stage 1 through stage 4 chronic kidney disease, or unspecified chronic kidney disease: Secondary | ICD-10-CM | POA: Diagnosis not present

## 2023-11-02 DIAGNOSIS — N189 Chronic kidney disease, unspecified: Secondary | ICD-10-CM | POA: Insufficient documentation

## 2023-11-02 DIAGNOSIS — I509 Heart failure, unspecified: Secondary | ICD-10-CM | POA: Insufficient documentation

## 2023-11-02 DIAGNOSIS — M25561 Pain in right knee: Secondary | ICD-10-CM | POA: Diagnosis not present

## 2023-11-02 DIAGNOSIS — R42 Dizziness and giddiness: Secondary | ICD-10-CM | POA: Insufficient documentation

## 2023-11-02 DIAGNOSIS — I10 Essential (primary) hypertension: Secondary | ICD-10-CM | POA: Diagnosis not present

## 2023-11-02 DIAGNOSIS — R55 Syncope and collapse: Secondary | ICD-10-CM | POA: Diagnosis not present

## 2023-11-02 DIAGNOSIS — I484 Atypical atrial flutter: Secondary | ICD-10-CM

## 2023-11-02 DIAGNOSIS — I491 Atrial premature depolarization: Secondary | ICD-10-CM | POA: Diagnosis not present

## 2023-11-02 LAB — URINALYSIS, ROUTINE W REFLEX MICROSCOPIC
Bilirubin Urine: NEGATIVE
Glucose, UA: NEGATIVE mg/dL
Ketones, ur: NEGATIVE mg/dL
Leukocytes,Ua: NEGATIVE
Nitrite: NEGATIVE
Protein, ur: 30 mg/dL — AB
Specific Gravity, Urine: 1.014 (ref 1.005–1.030)
pH: 5 (ref 5.0–8.0)

## 2023-11-02 LAB — BASIC METABOLIC PANEL
Anion gap: 17 — ABNORMAL HIGH (ref 5–15)
BUN: 35 mg/dL — ABNORMAL HIGH (ref 6–20)
CO2: 14 mmol/L — ABNORMAL LOW (ref 22–32)
Calcium: 10 mg/dL (ref 8.9–10.3)
Chloride: 107 mmol/L (ref 98–111)
Creatinine, Ser: 2.14 mg/dL — ABNORMAL HIGH (ref 0.61–1.24)
GFR, Estimated: 35 mL/min — ABNORMAL LOW (ref >60)
Glucose, Bld: 104 mg/dL — ABNORMAL HIGH (ref 70–99)
Potassium: 4 mmol/L (ref 3.5–5.1)
Sodium: 138 mmol/L (ref 135–145)

## 2023-11-02 LAB — CBC WITH DIFFERENTIAL/PLATELET
Abs Immature Granulocytes: 0.09 10*3/uL — ABNORMAL HIGH (ref 0.00–0.07)
Basophils Absolute: 0.1 10*3/uL (ref 0.0–0.1)
Basophils Relative: 1 %
Eosinophils Absolute: 0.2 10*3/uL (ref 0.0–0.5)
Eosinophils Relative: 1 %
HCT: 34.8 % — ABNORMAL LOW (ref 39.0–52.0)
Hemoglobin: 11.5 g/dL — ABNORMAL LOW (ref 13.0–17.0)
Immature Granulocytes: 1 %
Lymphocytes Relative: 15 %
Lymphs Abs: 1.8 10*3/uL (ref 0.7–4.0)
MCH: 28.5 pg (ref 26.0–34.0)
MCHC: 33 g/dL (ref 30.0–36.0)
MCV: 86.4 fL (ref 80.0–100.0)
Monocytes Absolute: 1 10*3/uL (ref 0.1–1.0)
Monocytes Relative: 8 %
Neutro Abs: 9.1 10*3/uL — ABNORMAL HIGH (ref 1.7–7.7)
Neutrophils Relative %: 74 %
Platelets: 666 10*3/uL — ABNORMAL HIGH (ref 150–400)
RBC: 4.03 MIL/uL — ABNORMAL LOW (ref 4.22–5.81)
RDW: 14 % (ref 11.5–15.5)
WBC: 12.3 10*3/uL — ABNORMAL HIGH (ref 4.0–10.5)
nRBC: 0 % (ref 0.0–0.2)

## 2023-11-02 LAB — HEPATIC FUNCTION PANEL
ALT: 106 U/L — ABNORMAL HIGH (ref 0–44)
AST: 68 U/L — ABNORMAL HIGH (ref 15–41)
Albumin: 3.8 g/dL (ref 3.5–5.0)
Alkaline Phosphatase: 118 U/L (ref 38–126)
Bilirubin, Direct: <0.1 mg/dL (ref 0.0–0.2)
Total Bilirubin: 1 mg/dL (ref <1.2)
Total Protein: 8.7 g/dL — ABNORMAL HIGH (ref 6.5–8.1)

## 2023-11-02 LAB — LACTIC ACID, PLASMA: Lactic Acid, Venous: 1.5 mmol/L (ref 0.5–1.9)

## 2023-11-02 LAB — TROPONIN I (HIGH SENSITIVITY): Troponin I (High Sensitivity): 9 ng/L (ref <18)

## 2023-11-02 MED ORDER — LACTATED RINGERS IV BOLUS
500.0000 mL | Freq: Once | INTRAVENOUS | Status: AC
  Administered 2023-11-02: 500 mL via INTRAVENOUS

## 2023-11-02 NOTE — Telephone Encounter (Signed)
Prescription refill request for Eliquis received. Indication:afib Last office visit:11/24 Scr:2.14  12/24 Age: 57 Weight:86.2  kg  Prescription refilled

## 2023-11-02 NOTE — ED Triage Notes (Signed)
EMS brings pt in from home (sister POA), hx autism, schizophrenia; episode of dizziness PTA with no syncope reported, hx orthostatic hypotension

## 2023-11-02 NOTE — ED Provider Notes (Signed)
[facility] Provider Note    Event Date/Time   First MD Initiated Contact with Patient 11/02/23 [phone]     (approximate)   History   Dizziness   HPI  Michael Mcknight is a 57 year old male with history of HTN, A-fib, CHF, CKD, schizophrenia presenting to the emergency department for evaluation of dizziness.  Patient reports that he use the bathroom tonight when he started to become lightheaded and leaned against a wall.  He then stood up and had some recurrent lightheadedness.  No syncope.  Did not hit his head.  Denies any trauma.  No preceding chest pain, shortness of breath.  Does report history of similar symptoms previously.  Reports normal oral intake.  No fevers, chills, nausea, vomiting, abdominal pain, diarrhea, dysuria.  Does report that he has been having some ongoing pain in his right knee for the past few weeks that he thinks is related to his gout.  I did review patient's outpatient cardiology note from 10/11/2023.  At that time, patient was seen for dizziness which was thought to be multifactorial related to his hypotension and medication use.    Physical Exam   Triage Vital Signs: ED Triage Vitals  Encounter Vitals Group     BP 11/02/23 0535 (!) 109/51     Systolic BP Percentile --      Diastolic BP Percentile --      Pulse Rate 11/02/23 0535 78     Resp 11/02/23 0535 19     Temp 11/02/23 0535 98.1 F (36.7 C)     Temp src --      SpO2 11/02/23 0535 96 %     Weight 11/02/23 0538 190 lb (86.2 kg)     Height 11/02/23 0538 5\' 6"  (1.676 m)     Head Circumference --      Peak Flow --      Pain Score 11/02/23 0538 5     Pain Loc --      Pain Education --      Exclude from Growth Chart --     Most recent vital signs: Vitals:   11/02/23 0535 11/02/23 0924  BP: (!) 109/51 (!) 150/78  Pulse: 78 81  Resp: 19 16  Temp: 98.1 F (36.7 C)   SpO2: 96% 96%   Nursing notes and vital signs reviewed.  General: Adult male, laying in bed, awake  interactive Head: Atraumatic Chest: Symmetric chest rise, no tenderness to palpation.  Cardiac: Regular rhythm and rate.  Respiratory: Lungs clear to auscultation Abdomen: Soft, nondistended. No tenderness to palpation.  MSK: No deformity to bilateral upper and lower extremity.  There is swelling of the right knee compared to the contralateral side.  No appreciable overlying erythema, mildly warm to palpation.  Patient is able to actively range the knee, but does report pain with this.  No significant tenderness over the joint lines.  Otherwise freely ranging bilateral upper and lower extremities. Neuro: Alert, oriented. GCS 15. Normal sensation to light touch in bilateral upper and lower extremity. Skin: No evidence of burns or lacerations.  ED Results / Procedures / Treatments   Labs (all labs ordered are listed, but only abnormal results are displayed) Labs Reviewed  CBC WITH DIFFERENTIAL/PLATELET - Abnormal; Notable for the following components:      Result Value   WBC 12.3 (*)    RBC 4.03 (*)    Hemoglobin 11.5 (*)    HCT 34.8 (*)    Platelets 666 (*)  Neutro Abs 9.1 (*)    Abs Immature Granulocytes 0.09 (*)    All other components within normal limits  BASIC METABOLIC PANEL - Abnormal; Notable for the following components:   CO2 14 (*)    Glucose, Bld 104 (*)    BUN 35 (*)    Creatinine, Ser 2.14 (*)    GFR, Estimated 35 (*)    Anion gap 17 (*)    All other components within normal limits  URINALYSIS, ROUTINE W REFLEX MICROSCOPIC - Abnormal; Notable for the following components:   Color, Urine YELLOW (*)    APPearance HAZY (*)    Hgb urine dipstick MODERATE (*)    Protein, ur 30 (*)    Bacteria, UA FEW (*)    All other components within normal limits  HEPATIC FUNCTION PANEL - Abnormal; Notable for the following components:   Total Protein 8.7 (*)    AST 68 (*)    ALT 106 (*)    All other components within normal limits  LACTIC ACID, PLASMA  TROPONIN I (HIGH  SENSITIVITY)     EKG EKG independently reviewed interpreted by myself (ER attending) demonstrates:  EKG demonstrates sinus rhythm at a rate of 75, PR 140, QRS 122, QTc 445, no acute ST changes, right bundle branch block noted  RADIOLOGY Imaging independently reviewed and interpreted by myself demonstrates:    PROCEDURES:  Critical Care performed: No  Procedures   MEDICATIONS ORDERED IN ED: Medications  lactated ringers bolus 500 mL (0 mLs Intravenous Stopped 11/02/23 1214)     IMPRESSION / MDM / ASSESSMENT AND PLAN / ED COURSE  I reviewed the triage vital signs and the nursing notes.  Differential diagnosis includes, but is not limited to, anemia, electrolyte abnormality, medication adverse effect, orthostatic hypotension  Patient's presentation is most consistent with acute presentation with potential threat to life or bodily function.  57 year old male presenting to the emergency department for evaluation of lightheadedness.  Mildly low blood pressure on presentation, improved on repeat.  Patient denying symptoms here.  No significant traumatic injuries noted.  Did discuss his right knee swelling.  Clinical history is overall less suggestive of septic arthritis.  I did discuss possible arthrocentesis, but patient would like to hold off for now which I do think is reasonable.  On review of his labs, he does have a mild leukocytosis without obvious infectious source.  His BMP is notable for acidosis with a bicarb of 14 and a slight anion gap.  Normal glucose, no history of diabetes suggestive of lead euglycemic DKA.  Reports normal p.o. intake, lower suspicion alcoholic ketoacidosis or starvation ketoacidosis.  Creatinine slightly up from baseline.  Acidosis does seem new for the patient.  Will add on a lactate and give some IV fluids.  Lactate returned within normal limits.  Hepatic panel with mild transaminitis.  Urine without evidence of ketones.  Unclear exact etiology of  patient's acidosis, but given his overall clinical presentation, do think he is stable for discharge with outpatient follow-up.  Patient comfortable this plan.  Strict return precautions provided.      FINAL CLINICAL IMPRESSION(S) / ED DIAGNOSES   Final diagnoses:  Lightheadedness  Right knee pain, unspecified chronicity     Rx / DC Orders   ED Discharge Orders     None        Note:  This document was prepared using Dragon voice recognition software and may include unintentional dictation errors.   Trinna Post, MD 11/02/23 1228

## 2023-11-02 NOTE — Discharge Instructions (Signed)
You were seen in the Emergency Department today for evaluation of your dizziness.  Fortunately we did not find an emergency cause for this.  Please follow-up closely with your primary care doctor for further evaluation.  Return to the ER for new or worsening symptoms.

## 2023-11-02 NOTE — Telephone Encounter (Signed)
Pt's sister called to let us know that her brother fell again and is in [facility] This is the 3rd time he has been in hospital since this dizziness started. Pt has appt on 12/30 and we will follow up with him then.

## 2023-11-02 NOTE — ED Triage Notes (Signed)
Pt to ED via EMS from home, pt reports dizziness after using restroom tonight, pt became weak and laid back against wall to support himself. Pt states when he initially stood up he was dizzy again. Pt currently denying symptoms, denies chest pain or shortness of breath.

## 2023-11-03 DIAGNOSIS — R634 Abnormal weight loss: Secondary | ICD-10-CM | POA: Diagnosis not present

## 2023-11-03 DIAGNOSIS — R5381 Other malaise: Secondary | ICD-10-CM | POA: Diagnosis not present

## 2023-11-04 DIAGNOSIS — I499 Cardiac arrhythmia, unspecified: Secondary | ICD-10-CM | POA: Diagnosis not present

## 2023-11-04 DIAGNOSIS — R5381 Other malaise: Secondary | ICD-10-CM | POA: Diagnosis not present

## 2023-11-04 DIAGNOSIS — R197 Diarrhea, unspecified: Secondary | ICD-10-CM | POA: Diagnosis not present

## 2023-11-04 DIAGNOSIS — R748 Abnormal levels of other serum enzymes: Secondary | ICD-10-CM | POA: Diagnosis not present

## 2023-11-04 DIAGNOSIS — H9193 Unspecified hearing loss, bilateral: Secondary | ICD-10-CM | POA: Diagnosis not present

## 2023-11-04 DIAGNOSIS — R42 Dizziness and giddiness: Secondary | ICD-10-CM | POA: Diagnosis not present

## 2023-11-04 DIAGNOSIS — G9389 Other specified disorders of brain: Secondary | ICD-10-CM | POA: Diagnosis not present

## 2023-11-04 DIAGNOSIS — D649 Anemia, unspecified: Secondary | ICD-10-CM | POA: Diagnosis not present

## 2023-11-04 DIAGNOSIS — R790 Abnormal level of blood mineral: Secondary | ICD-10-CM | POA: Diagnosis not present

## 2023-11-04 DIAGNOSIS — Z8669 Personal history of other diseases of the nervous system and sense organs: Secondary | ICD-10-CM | POA: Diagnosis not present

## 2023-11-07 DIAGNOSIS — I951 Orthostatic hypotension: Secondary | ICD-10-CM | POA: Diagnosis not present

## 2023-11-08 DIAGNOSIS — R748 Abnormal levels of other serum enzymes: Secondary | ICD-10-CM | POA: Diagnosis not present

## 2023-11-08 DIAGNOSIS — R5381 Other malaise: Secondary | ICD-10-CM | POA: Diagnosis not present

## 2023-11-08 DIAGNOSIS — R634 Abnormal weight loss: Secondary | ICD-10-CM | POA: Diagnosis not present

## 2023-11-08 DIAGNOSIS — R7989 Other specified abnormal findings of blood chemistry: Secondary | ICD-10-CM | POA: Diagnosis not present

## 2023-11-08 DIAGNOSIS — R5383 Other fatigue: Secondary | ICD-10-CM | POA: Diagnosis not present

## 2023-11-08 DIAGNOSIS — D649 Anemia, unspecified: Secondary | ICD-10-CM | POA: Diagnosis not present

## 2023-11-12 DIAGNOSIS — R932 Abnormal findings on diagnostic imaging of liver and biliary tract: Secondary | ICD-10-CM | POA: Diagnosis not present

## 2023-11-12 NOTE — Progress Notes (Unsigned)
Cardiology Office Note:    Date:  11/15/2023   ID:  Michael Mcknight, DOB 12/29/1965, MRN [id number]  PCP:  Ellis Parents, FNP  Cardiologist:  Norman Herrlich, MD    Referring MD: Ellis Parents, FNP    ASSESSMENT:    1. Orthostatic hypotension   2. Hypertensive heart and kidney disease with chronic diastolic congestive heart failure and stage 4 chronic kidney disease (HCC)   3. Paroxysmal atrial fibrillation (HCC)    PLAN:    In order of problems listed above:  Very complex case with continued severe debilitating orthostatic hypotension.  He is off antihypertensive agents he is on Florinef and midodrine added abdominal binder the family does not think he would use or could use support hose increase midodrine maximum dose and reluctantly start neuro therapy with his CKD If he is unimproved referral to dysautonomia program Home health consult at the family's request with his static hypotension he is incapable of his own care Obviously need to be concerned with his atrial fibrillation and the potential for injury with falls and anticoagulant prompting maximization of treatment Ortho orthostatic hypotension QT interval checked remains in acceptable range with midodrine   Next appointment: 6 weeks   Medication Adjustments/Labs and Tests Ordered: Current medicines are reviewed at length with the patient today.  Concerns regarding medicines are outlined above.  Orders Placed This Encounter  Procedures   EKG 12-Lead   No orders of the defined types were placed in this encounter.    History of Present Illness:    [name] is a 57 y.o. male with a hx of paroxysmal atrial fibrillation maintaining sinus rhythm history of heart failure initially reduced ejection fraction subsequently normalizing hypertension with stage IV CKD bundle branch block previous QT prolongation sleep apnea and schizophrenia last seen 08/03/2023.  He had severe symptomatic orthostatic hypotension  and was placed on midodrine fasting cortisol level normal.  Subsequent office visit 10/11/2023 was placed on Florinef.  He is on risperidone and Artane both of which can cause orthostatic hypotension  Compliance with diet, lifestyle and medications: Yes  He is not doing well he is frustrated the family is frustrated. Supine blood pressure runs in the range of 120 and 130 systolic but standing if they get a blood pressure less than 100 systolic he has had several episodes of syncope or near syncope and ED visits Were to maximize treatment using abdominal binder continue Florinef increase midodrine to 10 mg 3 times daily and reluctantly start [place] [employer] therapy despite his stage IV CKD  He has marked orthostatic intolerance  I reviewed his recent ED record his GFR is stage IIIb not stage IV his anion gap was normal lactic acid level Past Medical History:  Diagnosis Date   AF (paroxysmal atrial fibrillation) (HCC) 09/20/2021   Atrial fibrillation/flutter (HCC) 09/22/2019   Atypical atrial flutter (HCC)    Benign essential HTN 09/22/2019   Cardiomegaly 09/22/2019   Cardiorenal syndrome with renal failure 06/26/2020   Chronic systolic congestive heart failure (HCC) 06/26/2020   CKD (chronic kidney disease) stage 4, GFR 15-29 ml/min (HCC) 06/26/2020   CKD (chronic kidney disease), stage III (HCC) 09/22/2019   Congestive heart failure due to cardiomyopathy [facility]) New York Heart Association class II 10/23/2019   Cyst of left kidney 10/04/2020   Dilated cardiomyopathy (HCC) ejection fraction 35% in November 2020 10/23/2019   Lobar pneumonia (HCC) 09/22/2019   Near syncope 09/20/2021   Obesity (BMI 30-39.9) 06/26/2020   Orthostatic  hypotension 09/20/2021   Pleural effusion 09/22/2019   Schizophrenia (HCC) 09/22/2019    Current Medications: Current Meds  Medication Sig   acetaminophen (TYLENOL) 500 MG tablet Take 1,000 mg by mouth every 6 (six) hours as needed for moderate pain or headache.    amLODipine (NORVASC) 5 MG tablet Take 5 mg by mouth daily.   ELIQUIS 5 MG TABS tablet TAKE ONE TABLET BY MOUTH EVERY DAY and TAKE ONE TABLET BY MOUTH AT BEDTIME   escitalopram (LEXAPRO) 10 MG tablet Take 10 mg by mouth daily.   fludrocortisone (FLORINEF) 0.1 MG tablet Take 1 tablet (0.1 mg total) by mouth daily.   insulin glargine (LANTUS) 100 UNIT/ML injection Inject 4 Units into the skin at bedtime.   midodrine (PROAMATINE) 5 MG tablet Take 1 tablet (5 mg total) by mouth 3 (three) times daily with meals.   risperidone (RISPERDAL) 4 MG tablet Take 4 mg by mouth at bedtime.   trihexyphenidyl (ARTANE) 2 MG tablet Take 2 mg by mouth 2 (two) times daily.      EKGs/Labs/Other Studies Reviewed:    The following studies were reviewed today:  Cardiac Studies & Procedures      ECHOCARDIOGRAM  ECHOCARDIOGRAM COMPLETE 08/27/2022  Narrative ECHOCARDIOGRAM REPORT    [name] Date of Exam: 08/27/2022 Medical Rec #:  [id number]        Height:       66.0 in Accession #:    [id number]       Weight:       247.4 lb Date of Birth:  September 15, 1966        BSA:          2.189 m Patient Age:    56 years         BP:           100/70 mmHg Patient Gender: M                HR:           65 bpm. Exam Location:  [place]  Procedure: 2D Echo, Cardiac Doppler, Color Doppler and Strain Analysis  Indications:    Dyspnea on exertion [R06.09 (ICD-10-CM)]  History:        Patient has prior history of Echocardiogram examinations, most recent 05/14/2021. CHF and Cardiomegaly, Arrythmias:Atrial Fibrillation and Atrial Flutter, Signs/Symptoms:Dyspnea; Risk Factors:Hypertension.  Sonographer:    Margreta Journey RDCS Referring Phys: [id number] ROBERT J KRASOWSKI  IMPRESSIONS   1. Left ventricular ejection fraction, by estimation, is 55 to 60%. The left ventricle has normal function. Left ventricular endocardial border not optimally defined to evaluate regional wall motion. Left ventricular  diastolic parameters are consistent with Grade II diastolic dysfunction (pseudonormalization). Elevated left atrial pressure. The average left ventricular global longitudinal strain is -16.7 %. 2. Right ventricular systolic function is normal. The right ventricular size is normal. There is mildly elevated pulmonary artery systolic pressure. 3. A small pericardial effusion is present. The pericardial effusion is posterior to the left ventricle. 4. The mitral valve is normal in structure. No evidence of mitral valve regurgitation. No evidence of mitral stenosis. 5. The aortic valve is tricuspid. Aortic valve regurgitation is not visualized. No aortic stenosis is present. 6. Aortic DTA is NWV.  FINDINGS Left Ventricle: Left ventricular ejection fraction, by estimation, is 55 to 60%. The left ventricle has normal function. Left ventricular endocardial border not optimally defined to evaluate regional wall motion. The average left ventricular global longitudinal strain is -16.7 %.  The left ventricular internal cavity size was normal in size. There is no left ventricular hypertrophy. Left ventricular diastolic parameters are consistent with Grade II diastolic dysfunction (pseudonormalization). Elevated left atrial pressure.  Right Ventricle: The right ventricular size is normal. No increase in right ventricular wall thickness. Right ventricular systolic function is normal. There is mildly elevated pulmonary artery systolic pressure. The tricuspid regurgitant velocity is 2.79 m/s, and with an assumed right atrial pressure of 8 mmHg, the estimated right ventricular systolic pressure is 39.1 mmHg.  Left Atrium: Left atrial size was normal in size.  Right Atrium: Right atrial size was normal in size.  Pericardium: A small pericardial effusion is present. The pericardial effusion is posterior to the left ventricle.  Mitral Valve: The mitral valve is normal in structure. No evidence of mitral valve  regurgitation. No evidence of mitral valve stenosis.  Tricuspid Valve: The tricuspid valve is normal in structure. Tricuspid valve regurgitation is mild . No evidence of tricuspid stenosis.  Aortic Valve: The aortic valve is tricuspid. Aortic valve regurgitation is not visualized. No aortic stenosis is present.  Pulmonic Valve: The pulmonic valve was normal in structure. Pulmonic valve regurgitation is trivial. No evidence of pulmonic stenosis.  Aorta: The aortic root and ascending aorta are structurally normal, with no evidence of dilitation, the aortic arch was not well visualized, the aortic root is normal in size and structure and DTA is NWV.  Venous: A systolic blunting flow pattern is recorded from the right upper pulmonary vein. The inferior vena cava was not well visualized.  IAS/Shunts: No atrial level shunt detected by color flow Doppler.   LEFT VENTRICLE PLAX 2D LVIDd:         5.60 cm   Diastology LVIDs:         3.80 cm   LV e' medial:    6.31 cm/s LV PW:         1.10 cm   LV E/e' medial:  17.4 LV IVS:        1.10 cm   LV e' lateral:   8.38 cm/s LVOT diam:     1.90 cm   LV E/e' lateral: 13.1 LV SV:         74 LV SV Index:   34        2D Longitudinal Strain LVOT Area:     2.84 cm  2D Strain GLS Avg:     -16.7 %   RIGHT VENTRICLE             IVC RV Basal diam:  3.20 cm     IVC diam: 2.40 cm RV S prime:     12.30 cm/s TAPSE (M-mode): 3.1 cm  LEFT ATRIUM             Index        RIGHT ATRIUM           Index LA diam:        4.70 cm 2.15 cm/m   RA Area:     16.20 cm LA Vol (A2C):   63.0 ml 28.78 ml/m  RA Volume:   36.80 ml  16.81 ml/m LA Vol (A4C):   55.9 ml 25.54 ml/m LA Biplane Vol: 59.5 ml 27.18 ml/m AORTIC VALVE LVOT Vmax:   116.00 cm/s LVOT Vmean:  78.000 cm/s LVOT VTI:    0.260 m  AORTA Ao Root diam: 3.90 cm Ao Asc diam:  3.20 cm Ao Desc diam: 2.10 cm  MITRAL VALVE  TRICUSPID VALVE MV Area (PHT): 3.53 cm     TR Peak grad:   31.1  mmHg MV Decel Time: 215 msec     TR Vmax:        279.00 cm/s MR Peak grad: 102.4 mmHg MR Vmax:      506.00 cm/s   SHUNTS MV E velocity: 110.00 cm/s  Systemic VTI:  0.26 m MV A velocity: 87.30 cm/s   Systemic Diam: 1.90 cm MV E/A ratio:  1.26  Norman Herrlich MD Electronically signed by Norman Herrlich MD Signature Date/Time: 08/27/2022/1:52:35 PM    Final  TEE  ECHO TEE 09/26/2019  Narrative TRANSESOPHOGEAL ECHO REPORT    Patient Name:   JEFFREE LOVGREN Gayheart Date of Exam: 09/26/2019 Medical Rec #:  [id number]        Height:       67.0 in Accession #:    [id number]       Weight:       231.0 lb Date of Birth:  1966-07-07        BSA:          2.15 m Patient Age:    53 years         BP:           122/86 mmHg Patient Gender: M                HR:           120 bpm. Exam Location:  Inpatient   Procedure: Transesophageal Echo, Cardiac Doppler and Color Doppler  Indications:     I48.92* Unspecified atrial flutter  History:         Patient has no prior history of Echocardiogram examinations. Cardiomegaly; Arrythmias:Atrial Fibrillation and Atrial Flutter.  Sonographer:     Sheralyn Boatman RDCS Referring Phys:  [id number] Tacey Ruiz DUNN Diagnosing Phys: Kristeen Miss MD    PROCEDURE: The TEE was followed by a successful Cardioversion. Patients was monitored while under deep sedation Anesthestetic sedation was provided intravenously by Anesthesiology: 250mg  of Propofol. The transesophogeal probe was passed through the esophogus of the patient. Imaged were obtained with the patient in a left lateral decubitus position. The patient developed no complications during the procedure.  IMPRESSIONS   1. Left ventricular ejection fraction, by visual estimation, is 30 to 35%. The left ventricle has moderate to severely decreased function. There is no left ventricular hypertrophy. 2. Global right ventricle has mildly reduced systolic function.The right ventricular size is normal. No increase in right  ventricular wall thickness. 3. Left atrial size was normal. 4. No thrombi in the LA and LAA. 5. Right atrial size was normal. 6. The mitral valve is normal in structure. Mild to moderate mitral valve regurgitation. 7. The tricuspid valve is normal in structure. Tricuspid valve regurgitation moderate. 8. The aortic valve is normal in structure. Aortic valve regurgitation is trivial. 9. The pulmonic valve was normal in structure. Pulmonic valve regurgitation is trivial. 10. Moderately elevated pulmonary artery systolic pressure.  FINDINGS Left Ventricle: Left ventricular ejection fraction, by visual estimation, is 30 to 35%. The left ventricle has moderate to severely decreased function. There is no left ventricular hypertrophy.  Right Ventricle: The right ventricular size is normal. No increase in right ventricular wall thickness. Global RV systolic function is has mildly reduced systolic function. The tricuspid regurgitant velocity is 3.01 m/s, and with an assumed right atrial pressure of 15 mmHg, the estimated right ventricular systolic pressure is moderately elevated at 51.2 mmHg.  Left Atrium:  Left atrial size was normal in size. No thrombi in the LA and LAA.  Right Atrium: Right atrial size was normal in size  Pericardium: There is no evidence of pericardial effusion.  Mitral Valve: The mitral valve is normal in structure. There is mild thickening of the mitral valve leaflet(s). Mild to moderate mitral valve regurgitation.  Tricuspid Valve: The tricuspid valve is normal in structure. Tricuspid valve regurgitation moderate.  Aortic Valve: The aortic valve is normal in structure. Aortic valve regurgitation is trivial.  Pulmonic Valve: The pulmonic valve was normal in structure. Pulmonic valve regurgitation is trivial.  Aorta: The aortic root and ascending aorta are structurally normal, with no evidence of dilitation.  Shunts: No atrial level shunt detected by color flow  Doppler.   TRICUSPID VALVE             Normals TR Peak grad:   36.2 mmHg TR Vmax:        301.00 cm/s 288 cm/s   Kristeen Miss MD Electronically signed by Kristeen Miss MD Signature Date/Time: 09/26/2019/5:08:23 PM    Final            EKG Interpretation Date/Time:  Monday November 15 2023 15:49:19 EST Ventricular Rate:  90 PR Interval:  136 QRS Duration:  118 QT Interval:  388 QTC Calculation: 474 R Axis:   72  Text Interpretation: Sinus rhythm with Premature supraventricular complexes Incomplete right bundle branch block Borderline ECG When compared with ECG of 02-Nov-2023 05:39, No significant change was found Confirmed by Norman Herrlich ([id number]) on 11/15/2023 3:11:50 PM    Recent Labs: 11/02/2023: ALT 106; BUN 35; Creatinine, Ser 2.14; Hemoglobin 11.5; Platelets 666; Potassium 4.0; Sodium 138  Recent Lipid Panel    Component Value Date/Time   CHOL 138 09/23/2019 0247   TRIG 84 09/23/2019 0247   HDL 35 (L) 09/23/2019 0247   CHOLHDL 3.9 09/23/2019 0247   VLDL 17 09/23/2019 0247   LDLCALC 86 09/23/2019 0247    Physical Exam:    VS:  BP (!) 120/50   Pulse 88   Wt 185 lb (83.9 kg)   SpO2 95%   BMI 29.86 kg/m     Wt Readings from Last 3 Encounters:  11/15/23 185 lb (83.9 kg)  11/02/23 190 lb (86.2 kg)  10/11/23 204 lb 6.6 oz (92.7 kg)     GEN: Well nourished, well developed in no acute distress HEENT: Normal NECK: No JVD; No carotid bruits LYMPHATICS: No lymphadenopathy CARDIAC: RRR, no murmurs, rubs, gallops RESPIRATORY:  Clear to auscultation without rales, wheezing or rhonchi  ABDOMEN: Soft, non-tender, non-distended MUSCULOSKELETAL:  No edema; No deformity  SKIN: Warm and dry NEUROLOGIC:  Alert and oriented x 3 PSYCHIATRIC:  Normal affect    Signed, Norman Herrlich, MD  11/15/2023 2:47 PM    [facility]

## 2023-11-15 ENCOUNTER — Other Ambulatory Visit (HOSPITAL_COMMUNITY): Payer: Self-pay

## 2023-11-15 ENCOUNTER — Other Ambulatory Visit: Payer: Self-pay

## 2023-11-15 ENCOUNTER — Telehealth: Payer: Self-pay | Admitting: Pharmacy Technician

## 2023-11-15 ENCOUNTER — Ambulatory Visit: Payer: PPO | Attending: Cardiology | Admitting: Cardiology

## 2023-11-15 VITALS — BP 120/50 | HR 88 | Wt 185.0 lb

## 2023-11-15 DIAGNOSIS — I5032 Chronic diastolic (congestive) heart failure: Secondary | ICD-10-CM | POA: Diagnosis not present

## 2023-11-15 DIAGNOSIS — I951 Orthostatic hypotension: Secondary | ICD-10-CM

## 2023-11-15 DIAGNOSIS — G901 Familial dysautonomia [Riley-Day]: Secondary | ICD-10-CM | POA: Diagnosis not present

## 2023-11-15 DIAGNOSIS — I130 Hypertensive heart and chronic kidney disease with heart failure and stage 1 through stage 4 chronic kidney disease, or unspecified chronic kidney disease: Secondary | ICD-10-CM

## 2023-11-15 DIAGNOSIS — N184 Chronic kidney disease, stage 4 (severe): Secondary | ICD-10-CM | POA: Diagnosis not present

## 2023-11-15 DIAGNOSIS — I480 Paroxysmal atrial fibrillation: Secondary | ICD-10-CM | POA: Diagnosis not present

## 2023-11-15 MED ORDER — MIDODRINE HCL 10 MG PO TABS
10.0000 mg | ORAL_TABLET | Freq: Three times a day (TID) | ORAL | 3 refills | Status: DC
Start: 2023-11-15 — End: 2023-11-15

## 2023-11-15 MED ORDER — DROXIDOPA 100 MG PO CAPS: 100.0000 mg | ORAL_CAPSULE | Freq: Three times a day (TID) | ORAL | 3 refills | Status: DC

## 2023-11-15 MED ORDER — DROXIDOPA 100 MG PO CAPS: 100.0000 mg | ORAL_CAPSULE | Freq: Three times a day (TID) | ORAL | 1 refills | Status: DC

## 2023-11-15 MED ORDER — MIDODRINE HCL 10 MG PO TABS: 10.0000 mg | ORAL_TABLET | Freq: Three times a day (TID) | ORAL | 3 refills | Status: DC

## 2023-11-15 MED ORDER — MIDODRINE HCL 10 MG PO TABS: 10.0000 mg | ORAL_TABLET | Freq: Three times a day (TID) | ORAL | Status: DC

## 2023-11-15 NOTE — Telephone Encounter (Signed)
Pharmacy Patient Advocate Encounter   Received notification from CoverMyMeds that prior authorization for droxidopa is required/requested.   Insurance verification completed.   The patient is insured through [facility] ADVANTAGE/RX ADVANCE .   Per test claim: PA required; PA submitted to above mentioned insurance via CoverMyMeds Key/confirmation #/EOC [id number] Status is pending

## 2023-11-15 NOTE — Patient Instructions (Signed)
Medication Instructions:  Your physician has recommended you make the following change in your medication:   Increase Midodrine to 10 mg three times daily.  Start Northera 100 mg three times daily, if standing BP remains less than 100 after 1 week increase to 200 mg (2 tablets) three times daily. Keep a BP log and send in on MyChart in 2 weeks and 1 month.  *If you need a refill on your cardiac medications before your next appointment, please call your pharmacy*   Lab Work: None ordered If you have labs (blood work) drawn today and your tests are completely normal, you will receive your results only by: MyChart Message (if you have MyChart) OR A paper copy in the mail If you have any lab test that is abnormal or we need to change your treatment, we will call you to review the results.   Testing/Procedures: None ordered   Follow-Up: At [facility], you and your health needs are our priority.  As part of our continuing mission to provide you with exceptional heart care, we have created designated Provider Care Teams.  These Care Teams include your primary Cardiologist (physician) and Advanced Practice Providers (APPs -  Physician Assistants and Nurse Practitioners) who all work together to provide you with the care you need, when you need it.  We recommend signing up for the patient portal called "MyChart".  Sign up information is provided on this After Visit Summary.  MyChart is used to connect with patients for Virtual Visits (Telemedicine).  Patients are able to view lab/test results, encounter notes, upcoming appointments, etc.  Non-urgent messages can be sent to your provider as well.   To learn more about what you can do with MyChart, go to [email or web address].    Your next appointment:   6 week(s)  The format for your next appointment:   In Person  Provider:   Norman Herrlich, MD    Other Instructions   Use an abdominal binder  Important Information About  [employer]

## 2023-11-15 NOTE — Addendum Note (Signed)
Addended by: Eleonore Chiquito on: 11/15/2023 03:52 PM   Modules accepted: Orders

## 2023-11-15 NOTE — Addendum Note (Signed)
Addended by: Eleonore Chiquito on: 11/15/2023 03:56 PM   Modules accepted: Orders

## 2023-11-16 ENCOUNTER — Other Ambulatory Visit (HOSPITAL_COMMUNITY): Payer: Self-pay

## 2023-11-16 NOTE — Telephone Encounter (Signed)
 Pharmacy Patient Advocate Encounter  Received notification from [employer] that Prior Authorization for droxidopa  has been APPROVED from 11/15/23 to 12/15/23. Ran test claim, Copay is $579.31- one month. This test claim was processed through [facility]- copay amounts may vary at other pharmacies due to pharmacy/plan contracts, or as the patient moves through the different stages of their insurance plan.   PA #/Case ID/Reference #: [id number]

## 2023-11-22 ENCOUNTER — Encounter: Payer: Self-pay | Admitting: Cardiology

## 2023-11-25 ENCOUNTER — Telehealth: Payer: Self-pay

## 2023-11-25 NOTE — Telephone Encounter (Signed)
 Patient's sister Monta had sent in a list of blood pressures and Dr. Bernie reviewed them and recommended that since he was still very dizzy and having low blood pressures that the patient use salt liberally to help increase his blood pressure. Also he recommended waiting to see what the MRI, that the patient will be having on Monday shows. The patient's sister also noted that they had ordered an abdominal binder and the patient was using it now. The patient's sister verbalized understanding and had no further questions at this time.

## 2023-11-29 ENCOUNTER — Observation Stay (HOSPITAL_COMMUNITY): Payer: PPO

## 2023-11-29 ENCOUNTER — Other Ambulatory Visit: Payer: Self-pay

## 2023-11-29 ENCOUNTER — Inpatient Hospital Stay (HOSPITAL_COMMUNITY)
Admission: EM | Admit: 2023-11-29 | Discharge: 2023-12-06 | DRG: 092 | Disposition: A | Discharge status: Skilled Nursing Facility | Payer: PPO | Attending: Internal Medicine | Admitting: Internal Medicine

## 2023-11-29 ENCOUNTER — Emergency Department (HOSPITAL_COMMUNITY): Payer: PPO

## 2023-11-29 ENCOUNTER — Encounter: Payer: Self-pay | Admitting: Cardiology

## 2023-11-29 DIAGNOSIS — Z79899 Other long term (current) drug therapy: Secondary | ICD-10-CM

## 2023-11-29 DIAGNOSIS — Z751 Person awaiting admission to adequate facility elsewhere: Secondary | ICD-10-CM

## 2023-11-29 DIAGNOSIS — F209 Schizophrenia, unspecified: Secondary | ICD-10-CM | POA: Diagnosis present

## 2023-11-29 DIAGNOSIS — I951 Orthostatic hypotension: Principal | ICD-10-CM | POA: Diagnosis present

## 2023-11-29 DIAGNOSIS — Z91199 Patient's noncompliance with other medical treatment and regimen due to unspecified reason: Secondary | ICD-10-CM

## 2023-11-29 DIAGNOSIS — Z885 Allergy status to narcotic agent status: Secondary | ICD-10-CM

## 2023-11-29 DIAGNOSIS — I3139 Other pericardial effusion (noninflammatory): Secondary | ICD-10-CM | POA: Diagnosis present

## 2023-11-29 DIAGNOSIS — R42 Dizziness and giddiness: Secondary | ICD-10-CM

## 2023-11-29 DIAGNOSIS — F419 Anxiety disorder, unspecified: Secondary | ICD-10-CM | POA: Diagnosis present

## 2023-11-29 DIAGNOSIS — E1122 Type 2 diabetes mellitus with diabetic chronic kidney disease: Secondary | ICD-10-CM | POA: Diagnosis present

## 2023-11-29 DIAGNOSIS — Z7901 Long term (current) use of anticoagulants: Secondary | ICD-10-CM

## 2023-11-29 DIAGNOSIS — F32A Depression, unspecified: Secondary | ICD-10-CM | POA: Diagnosis present

## 2023-11-29 DIAGNOSIS — I5042 Chronic combined systolic (congestive) and diastolic (congestive) heart failure: Secondary | ICD-10-CM | POA: Diagnosis present

## 2023-11-29 DIAGNOSIS — I4510 Unspecified right bundle-branch block: Secondary | ICD-10-CM | POA: Diagnosis present

## 2023-11-29 DIAGNOSIS — Z794 Long term (current) use of insulin: Secondary | ICD-10-CM

## 2023-11-29 DIAGNOSIS — G901 Familial dysautonomia [Riley-Day]: Principal | ICD-10-CM | POA: Diagnosis present

## 2023-11-29 DIAGNOSIS — Z23 Encounter for immunization: Secondary | ICD-10-CM

## 2023-11-29 DIAGNOSIS — I130 Hypertensive heart and chronic kidney disease with heart failure and stage 1 through stage 4 chronic kidney disease, or unspecified chronic kidney disease: Secondary | ICD-10-CM | POA: Diagnosis present

## 2023-11-29 DIAGNOSIS — I4892 Unspecified atrial flutter: Secondary | ICD-10-CM | POA: Diagnosis present

## 2023-11-29 DIAGNOSIS — E876 Hypokalemia: Secondary | ICD-10-CM | POA: Diagnosis not present

## 2023-11-29 DIAGNOSIS — I480 Paroxysmal atrial fibrillation: Secondary | ICD-10-CM | POA: Diagnosis present

## 2023-11-29 DIAGNOSIS — N184 Chronic kidney disease, stage 4 (severe): Secondary | ICD-10-CM | POA: Diagnosis present

## 2023-11-29 DIAGNOSIS — G4733 Obstructive sleep apnea (adult) (pediatric): Secondary | ICD-10-CM | POA: Diagnosis present

## 2023-11-29 DIAGNOSIS — I420 Dilated cardiomyopathy: Secondary | ICD-10-CM | POA: Diagnosis present

## 2023-11-29 DIAGNOSIS — R079 Chest pain, unspecified: Secondary | ICD-10-CM | POA: Diagnosis not present

## 2023-11-29 DIAGNOSIS — Z7952 Long term (current) use of systemic steroids: Secondary | ICD-10-CM

## 2023-11-29 DIAGNOSIS — F840 Autistic disorder: Secondary | ICD-10-CM | POA: Diagnosis present

## 2023-11-29 HISTORY — DX: Dizziness and giddiness: R42

## 2023-11-29 LAB — TROPONIN I (HIGH SENSITIVITY)
Troponin I (High Sensitivity): 8 ng/L (ref <18)
Troponin I (High Sensitivity): 8 ng/L (ref <18)

## 2023-11-29 LAB — BASIC METABOLIC PANEL
Anion gap: 14 (ref 5–15)
BUN: 34 mg/dL — ABNORMAL HIGH (ref 6–20)
CO2: 16 mmol/L — ABNORMAL LOW (ref 22–32)
Calcium: 10.2 mg/dL (ref 8.9–10.3)
Chloride: 105 mmol/L (ref 98–111)
Creatinine, Ser: 2.22 mg/dL — ABNORMAL HIGH (ref 0.61–1.24)
GFR, Estimated: 34 mL/min — ABNORMAL LOW (ref >60)
Glucose, Bld: 106 mg/dL — ABNORMAL HIGH (ref 70–99)
Potassium: 4.2 mmol/L (ref 3.5–5.1)
Sodium: 135 mmol/L (ref 135–145)

## 2023-11-29 LAB — I-STAT VENOUS BLOOD GAS, ED
Acid-base deficit: 6 mmol/L — ABNORMAL HIGH (ref 0.0–2.0)
Bicarbonate: 16.7 mmol/L — ABNORMAL LOW (ref 20.0–28.0)
Calcium, Ion: 1.29 mmol/L (ref 1.15–1.40)
HCT: 28 % — ABNORMAL LOW (ref 39.0–52.0)
Hemoglobin: 9.5 g/dL — ABNORMAL LOW (ref 13.0–17.0)
O2 Saturation: 94 %
Potassium: 3.8 mmol/L (ref 3.5–5.1)
Sodium: 139 mmol/L (ref 135–145)
TCO2: 17 mmol/L — ABNORMAL LOW (ref 22–32)
pCO2, Ven: 23.2 mm[Hg] — ABNORMAL LOW (ref 44–60)
pH, Ven: 7.464 — ABNORMAL HIGH (ref 7.25–7.43)
pO2, Ven: 64 mm[Hg] — ABNORMAL HIGH (ref 32–45)

## 2023-11-29 LAB — CBC
HCT: 30.5 % — ABNORMAL LOW (ref 39.0–52.0)
Hemoglobin: 10 g/dL — ABNORMAL LOW (ref 13.0–17.0)
MCH: 28 pg (ref 26.0–34.0)
MCHC: 32.8 g/dL (ref 30.0–36.0)
MCV: 85.4 fL (ref 80.0–100.0)
Platelets: 628 10*3/uL — ABNORMAL HIGH (ref 150–400)
RBC: 3.57 MIL/uL — ABNORMAL LOW (ref 4.22–5.81)
RDW: 13.2 % (ref 11.5–15.5)
WBC: 11.8 10*3/uL — ABNORMAL HIGH (ref 4.0–10.5)
nRBC: 0 % (ref 0.0–0.2)

## 2023-11-29 LAB — MAGNESIUM: Magnesium: 2.1 mg/dL (ref 1.7–2.4)

## 2023-11-29 LAB — PHOSPHORUS: Phosphorus: 3.4 mg/dL (ref 2.5–4.6)

## 2023-11-29 LAB — HIV ANTIBODY (ROUTINE TESTING W REFLEX): HIV Screen 4th Generation wRfx: NONREACTIVE

## 2023-11-29 LAB — VITAMIN D 25 HYDROXY (VIT D DEFICIENCY, FRACTURES): Vit D, 25-Hydroxy: 43.53 ng/mL (ref 30–100)

## 2023-11-29 LAB — VITAMIN B12: Vitamin B-12: 290 pg/mL (ref 180–914)

## 2023-11-29 LAB — GLUCOSE, CAPILLARY
Glucose-Capillary: 79 mg/dL (ref 70–99)
Glucose-Capillary: 108 mg/dL — ABNORMAL HIGH (ref 70–99)

## 2023-11-29 LAB — BRAIN NATRIURETIC PEPTIDE: B Natriuretic Peptide: 64.5 pg/mL (ref 0.0–100.0)

## 2023-11-29 LAB — POC OCCULT BLOOD, ED: Fecal Occult Bld: NEGATIVE

## 2023-11-29 LAB — TSH: TSH: 1.372 u[IU]/mL (ref 0.350–4.500)

## 2023-11-29 MED ORDER — ONDANSETRON HCL 4 MG PO TABS: 4.0000 mg | ORAL_TABLET | Freq: Four times a day (QID) | ORAL | Status: DC | PRN

## 2023-11-29 MED ORDER — RISPERIDONE 2 MG PO TABS
4.0000 mg | ORAL_TABLET | Freq: Every day | ORAL | Status: DC
  Administered 2023-11-29 – 2023-12-05 (×7): 4 mg via ORAL
  Filled 2023-11-29 (×8): qty 2

## 2023-11-29 MED ORDER — ONDANSETRON HCL 4 MG/2ML IJ SOLN
4.0000 mg | Freq: Four times a day (QID) | INTRAMUSCULAR | Status: DC | PRN
Start: 2023-11-29 — End: 2023-12-06

## 2023-11-29 MED ORDER — MIDODRINE HCL 5 MG PO TABS
10.0000 mg | ORAL_TABLET | Freq: Three times a day (TID) | ORAL | Status: DC
  Administered 2023-11-29 – 2023-12-06 (×20): 10 mg via ORAL
  Filled 2023-11-29 (×20): qty 2

## 2023-11-29 MED ORDER — ACETAMINOPHEN 650 MG RE SUPP: 650.0000 mg | Freq: Four times a day (QID) | RECTAL | Status: DC | PRN

## 2023-11-29 MED ORDER — DROXIDOPA 100 MG PO CAPS
100.0000 mg | ORAL_CAPSULE | Freq: Three times a day (TID) | ORAL | Status: DC
Start: 2023-11-29 — End: 2023-12-01
  Administered 2023-11-29 – 2023-12-01 (×6): 100 mg via ORAL
  Filled 2023-11-29 (×7): qty 1

## 2023-11-29 MED ORDER — APIXABAN 5 MG PO TABS
5.0000 mg | ORAL_TABLET | Freq: Two times a day (BID) | ORAL | Status: DC
  Administered 2023-11-29 – 2023-12-06 (×15): 5 mg via ORAL
  Filled 2023-11-29 (×15): qty 1

## 2023-11-29 MED ORDER — ACETAMINOPHEN 325 MG PO TABS: 650.0000 mg | ORAL_TABLET | Freq: Four times a day (QID) | ORAL | Status: DC | PRN

## 2023-11-29 MED ORDER — FLUDROCORTISONE ACETATE 0.1 MG PO TABS
0.2000 mg | ORAL_TABLET | Freq: Every day | ORAL | Status: DC
  Administered 2023-11-29 – 2023-12-06 (×8): 0.2 mg via ORAL
  Filled 2023-11-29 (×8): qty 2

## 2023-11-29 MED ORDER — ENOXAPARIN SODIUM 40 MG/0.4ML IJ SOSY: 40.0000 mg | PREFILLED_SYRINGE | INTRAMUSCULAR | Status: DC

## 2023-11-29 MED ORDER — ESCITALOPRAM OXALATE 10 MG PO TABS
10.0000 mg | ORAL_TABLET | Freq: Every day | ORAL | Status: DC
  Administered 2023-11-29 – 2023-12-06 (×8): 10 mg via ORAL
  Filled 2023-11-29 (×8): qty 1

## 2023-11-29 MED ORDER — LORAZEPAM 2 MG/ML IJ SOLN
0.5000 mg | Freq: Once | INTRAMUSCULAR | Status: AC | PRN
  Administered 2023-11-29: 0.5 mg via INTRAVENOUS
  Filled 2023-11-29: qty 1

## 2023-11-29 MED ORDER — INSULIN ASPART 100 UNIT/ML IJ SOLN
0.0000 [IU] | Freq: Three times a day (TID) | INTRAMUSCULAR | Status: DC
Start: 2023-11-29 — End: 2023-12-06

## 2023-11-29 MED ORDER — SENNOSIDES-DOCUSATE SODIUM 8.6-50 MG PO TABS
1.0000 | ORAL_TABLET | Freq: Every evening | ORAL | Status: DC | PRN
Start: 2023-11-29 — End: 2023-12-06

## 2023-11-29 MED ORDER — SODIUM CHLORIDE 0.9 % IV BOLUS
500.0000 mL | Freq: Once | INTRAVENOUS | Status: AC
  Administered 2023-11-29: 500 mL via INTRAVENOUS

## 2023-11-29 MED ORDER — TRIHEXYPHENIDYL HCL 2 MG PO TABS
2.0000 mg | ORAL_TABLET | Freq: Two times a day (BID) | ORAL | Status: DC
  Administered 2023-11-29 – 2023-12-06 (×15): 2 mg via ORAL
  Filled 2023-11-29 (×16): qty 1

## 2023-11-29 NOTE — ED Notes (Signed)
 XR at bedside

## 2023-11-29 NOTE — H&P (Addendum)
 History and Physical    Patient: Michael Mcknight FMW:[id number] DOB: May 12, 1966 DOA: 11/29/2023 DOS: the patient was seen and examined on 11/29/2023 PCP: Orlando Dwayne NOVAK, FNP  Patient coming from: Home  Chief Complaint:  Chief Complaint  Patient presents with   Chest Pain   HPI: Michael Mcknight is a 58 y.o. male with medical history significant of A-fib/flutter on Eliquis , HTN, CKD stage IV, HFrecEF, OSA, orthostatic hypotension, dysautonomia, schizophrenia and autism spectrum disorder who presented to the ED for evaluation of dizziness and chest discomfort. Per sister, patient developed orthostatic hypotension with severe postural dizziness in August 2024. This is being managed by his PCP and cardiology. Patient continues to be dizzy even with changes to his medications and has had multiple falls secondary to the dizziness. This morning, patient was scheduled to get MRI (unsure what type of MRI) ordered by his PCP. They sat patient in the chair and when he tried to get up, he started having significant dizziness, waving his hands in the air so they called EMS. Patient reports that he has been having dizzy spells almost every day mostly when he stands up or sometimes even while laying down. He describes the spells as lightheadedness however according to sister he does have episodes that looks like the room spinning. He denies any chest pain, shortness of breath, palpitations, headaches or vision changes.  On EMS arrival, patient reported some chest discomfort so he was given ASA 325 mg x 1. He was found to be orthostatic with drop in his SBP to the 90s upon standing.  12-lead was unremarkable.  ED course: Initial vitals with temp 98.2, RR 17, HR 66, BP 136/88, SpO2 100% on room air.  Orthostatic vitals were positive in the ER with dropping SBP from 1 47-95 Labs show sodium 135, K+ 4.2, bicarb 16, creatinine 2.2, troponin 8-8, BNP 64.5, WBC 11.8, Hgb 10.0, platelets 628, VBG shows pH 7.46, pCO2  22.2, pO2 64, bicarb 16.7, negative fecal occult test,  EKG shows sinus rhythm with PACs and RBBB Chest x-ray with no acute cardiopulmonary disease Patient unable to get up to ambulate due to his severe postural dizziness. TRH was consulted for admission  Review of Systems: As mentioned in the history of present illness. All other systems reviewed and are negative. Past Medical History:  Diagnosis Date   AF (paroxysmal atrial fibrillation) (HCC) 09/20/2021   Atrial fibrillation/flutter (HCC) 09/22/2019   Atypical atrial flutter (HCC)    Benign essential HTN 09/22/2019   Cardiomegaly 09/22/2019   Cardiorenal syndrome with renal failure 06/26/2020   Chronic systolic congestive heart failure (HCC) 06/26/2020   CKD (chronic kidney disease) stage 4, GFR 15-29 ml/min (HCC) 06/26/2020   CKD (chronic kidney disease), stage III (HCC) 09/22/2019   Congestive heart failure due to cardiomyopathy (HCC) [place]  [employer] Association class II 10/23/2019   Cyst of left kidney 10/04/2020   Dilated cardiomyopathy (HCC) ejection fraction 35% in November 2020 10/23/2019   Lobar pneumonia (HCC) 09/22/2019   Near syncope 09/20/2021   Obesity (BMI 30-39.9) 06/26/2020   Orthostatic hypotension 09/20/2021   Pleural effusion 09/22/2019   Schizophrenia (HCC) 09/22/2019   Past Surgical History:  Procedure Laterality Date   CARDIOVERSION N/A 09/26/2019   Procedure: CARDIOVERSION;  Surgeon: Alveta Aleene PARAS, MD;  Location: [facility] ENDOSCOPY;  Service: Cardiovascular;  Laterality: N/A;   TEE WITHOUT CARDIOVERSION N/A 09/26/2019   Procedure: TRANSESOPHAGEAL ECHOCARDIOGRAM (TEE);  Surgeon: Alveta Aleene PARAS, MD;  Location: [facility] ENDOSCOPY;  Service: Cardiovascular;  Laterality: N/A;   Social History:  reports that he has never smoked. He has never used smokeless tobacco. No history on file for alcohol use and drug use.  Allergies  Allergen Reactions   Codeine Nausea And Vomiting    No family history on file.  Prior  to Admission medications   Medication Sig Start Date End Date Taking? Authorizing Provider  acetaminophen  (TYLENOL ) 500 MG tablet Take 1,000 mg by mouth every 6 (six) hours as needed for moderate pain or headache.    [provider]  allopurinol (ZYLOPRIM) 100 MG tablet Take 200 mg by mouth daily. Patient not taking: Reported on 11/15/2023 11/13/23   [provider]  amLODipine  (NORVASC ) 5 MG tablet Take 5 mg by mouth daily. 11/12/23   [provider]  droxidopa  (NORTHERA ) 100 MG CAPS Take 1 capsule (100 mg total) by mouth 3 (three) times daily with meals. 11/15/23   Monetta Redell PARAS, MD  ELIQUIS  5 MG TABS tablet TAKE ONE TABLET BY MOUTH EVERY DAY and TAKE ONE TABLET BY MOUTH AT BEDTIME 11/02/23   Krasowski, Robert J, MD  escitalopram  (LEXAPRO ) 10 MG tablet Take 10 mg by mouth daily. 02/07/20   [provider]  fludrocortisone  (FLORINEF ) 0.1 MG tablet Take 1 tablet (0.1 mg total) by mouth daily. 10/11/23   Carlin Delon BROCKS, NP  insulin  glargine (LANTUS) 100 UNIT/ML injection Inject 4 Units into the skin at bedtime.    [provider]  midodrine  (PROAMATINE ) 10 MG tablet Take 1 tablet (10 mg total) by mouth 3 (three) times daily with meals. 11/15/23   Monetta Redell PARAS, MD  risperidone  (RISPERDAL ) 4 MG tablet Take 4 mg by mouth at bedtime.    [provider]  trihexyphenidyl  (ARTANE ) 2 MG tablet Take 2 mg by mouth 2 (two) times daily. 02/12/20   [provider]    Physical Exam: Vitals:   11/29/23 9167 11/29/23 0840 11/29/23 0845 11/29/23 0945  BP:  136/88  (!) 141/71  Pulse:  66  66  Resp:  17  14  Temp:   98.2 F (36.8 C)   TempSrc:   Oral   SpO2:  100%  100%  Weight: 84 kg     Height: 5' 6 (1.676 m)      General: Pleasant, well-appearing middle-age man laying in bed. No acute distress. HEENT: [place]/AT. Anicteric sclera. MMM CV: Regular rate. Regular rhythm with occasional extra sounds. No murmurs, rubs, or gallops. No LE  edema Pulmonary: Lungs CTAB. Normal effort. No wheezing or rales. Abdominal: Soft, nontender, nondistended. Normal bowel sounds. Extremities: Palpable radial and DP pulses. Normal ROM. Skin: Warm and dry. No obvious rash or lesions. Neuro: A&Ox3. Moves all extremities. Normal sensation to light touch. No focal deficit. Psych: Normal mood and affect  Data Reviewed: Labs show sodium 135, K+ 4.2, bicarb 16, creatinine 2.2, troponin 8->8, BNP 64.5, WBC 11.8, Hgb 10.0, platelets 628, VBG shows pH 7.46, pCO2 22.2, pO2 64, bicarb 16.7, negative fecal occult test,  EKG shows sinus rhythm with PACs and RBBB Chest x-ray with no acute cardiopulmonary disease Ordered labs show TSH 1.37, Phos 3.4, mag 2.1, vitamin B12 290, vitamin D  43.5 and nonreactive HIV  Assessment and Plan: Michael Mcknight is a 58 y.o. male with medical history significant of A-fib/flutter on Eliquis , HTN, CKD stage IV, HFrecEF, OSA, orthostatic hypotension, dysautonomia, schizophrenia and autism spectrum disorder who presented to the ED for evaluation of dizziness and chest discomfort and admitted for orthostatic hypotension.  #  Orthostatic hypotension # Dysautonomia Patient with history of persistent orthostatic hypotension here for evaluation after had a severe postural dizziness this morning prior to going for an MRI. He is significantly orthostatic positive on admission with difficulty getting up due to the dizziness. SBP dropped from 147 lying down to 100 upon standing then 108 upon standing for 3. On chart review, during patient's ER visit at [facility] in November, a CT head showed Soft tissue attenuation adjacent in the bilateral [place] space, right greater than left, which could represent cholesteatomas. This can be an etiology of dizziness. Further evaluation with nonemergent/outpatient MRI is recommended. He was seen by his cardiology in November and was started on Florinef . He followed up with cardiology 2 weeks  ago due to persistent postural dizziness and his midodrine  was increased to 10 mg with plan to increase his droxidopa  to 200 mg if symptoms do not improve. Patient's scheduled MRI today was likely brain MRI so we will get this while he is inpatient. S/p IV NS 500 cc bolus in the ER -Brain MRI for further evaluation of dizziness, will need anti-anxiety per sister, PRN ativan  ordered -Increase Florinef  from 0.1 to 0.2 mg -Continue on midodrine  10 mg 3 times daily with meals -Continue droxidopa  100 mg 3 times daily with meals -Check morning cortisol -Fall precautions -Repeat orthostatic vitals tomorrow morning  # HFrecEF # Chronic diastolic heart failure Previously decreased LV function with a EF of 30-35% in November 2020 now increased to 55-60% on TTE in October 2023. Due to his persistent orthostatic hypotension, will repeat TTE to ensure Ef is stable while inpatient. Euvolemic on exam. -Repeat TTE  # A-fib/A-flutter # Hx of RBBB EKG shows sinus rhythm with PACs and RBBB. Normal electrolytes and TSH.  HR stable in the 60s-70s -Continue Eliquis  5 mg daily -Telemetry  # T2DM Persistent, patient was recently taken off insulin  after losing significant amount of weight. Blood glucose of 106 on BMP. -Follow-up repeat A1c -SSI with meals, CBG monitoring  # CKD stage IV Unclear baseline creatinine with creatinine 2.22 on admission from 2.14 3 weeks ago. Status post IV NS 500 cc bolus in the ER. -Trend renal function -Avoid nephrotoxic agents  # Schizophrenia Patient currently on risperidone  and trihexyphenidyl . Per sister, he has been on these medications for years and they preceded his orthostatic hypotension.  This meds are known causes of orthostatic hypotension. If the etiology of his orthostatic hypotension are not found, he will likely need to be weaned off slowly in the outpatient. -Continue on risperidone  and  trihexyphenidyl  for now  # OSA -CPAP at bedtime   Advance Care  Planning:   Code Status: Full Code   Consults: None  Family Communication: Discussed admission with sister at bedside  Severity of Illness: The appropriate patient status for this patient is OBSERVATION. Observation status is judged to be reasonable and necessary in order to provide the required intensity of service to ensure the patient's safety. The patient's presenting symptoms, physical exam findings, and initial radiographic and laboratory data in the context of their medical condition is felt to place them at decreased risk for further clinical deterioration. Furthermore, it is anticipated that the patient will be medically stable for discharge from the hospital within 2 midnights of admission.   Author: Claretta CHRISTELLA Alderman, MD 11/29/2023 11:56 AM  For on call review [email or web address].

## 2023-11-29 NOTE — Plan of Care (Signed)

## 2023-11-29 NOTE — ED Triage Notes (Addendum)
 Patient brought in by [facility] for chest pain. Patient endorses dizziness x few months, dealing with orthostatic hypotension per family. Patient had chest pain starting this am, hx of high functioning autism. Central chest pain 3/10, described as pressure, 324 ASA given by EMS. No N/V/SOB. Hx of orthostatics, initially 96/56, last BP 132/68, HR 65, CBG 129. Initially 86% on room air, arrives on room air with adequate sats. 12 lead unremarkable.

## 2023-11-29 NOTE — ED Provider Notes (Signed)
 [place] [facility] Provider Note   CSN: [id number] Arrival date & time: 11/29/23  0818     History  Chief Complaint  Patient presents with   Chest Pain    Michael Mcknight is a 58 y.o. male history of autism, schizophrenia, A-fib/flutter, CHF presented with chest pain that began this morning.  Patient has a history of orthostatic and states over the past few months he believes he has had with static hypotension but cannot further elaborate on this but states he gets dizzy going from sitting to standing position.  Patient states his chest pain began this morning was unable to say at what time.  Patient denies chest pain left arm pain left neck pain abdominal pain nausea/vomit fever shortness of breath, leg swelling.  Patient states he is taking his medications.  Patient did receive full dose aspirin from EMS.  Last echo: 08/27/2022 LVEF 55 to 60% with grade 2 diastolic dysfunction, mildly elevated pulmonary artery pressure, small pericardial effusion present  Home Medications Prior to Admission medications   Medication Sig Start Date End Date Taking? Authorizing Provider  acetaminophen  (TYLENOL ) 500 MG tablet Take 1,000 mg by mouth every 6 (six) hours as needed for moderate pain or headache.    [provider]  allopurinol (ZYLOPRIM) 100 MG tablet Take 200 mg by mouth daily. Patient not taking: Reported on 11/15/2023 11/13/23   [provider]  amLODipine  (NORVASC ) 5 MG tablet Take 5 mg by mouth daily. 11/12/23   [provider]  droxidopa  (NORTHERA ) 100 MG CAPS Take 1 capsule (100 mg total) by mouth 3 (three) times daily with meals. 11/15/23   Monetta Redell PARAS, MD  ELIQUIS  5 MG TABS tablet TAKE ONE TABLET BY MOUTH EVERY DAY and TAKE ONE TABLET BY MOUTH AT BEDTIME 11/02/23   Krasowski, Robert J, MD  escitalopram  (LEXAPRO ) 10 MG tablet Take 10 mg by mouth daily. 02/07/20   [provider]  fludrocortisone  (FLORINEF )  0.1 MG tablet Take 1 tablet (0.1 mg total) by mouth daily. 10/11/23   Carlin Delon JAYSON, NP  insulin  glargine (LANTUS) 100 UNIT/ML injection Inject 4 Units into the skin at bedtime.    [provider]  midodrine  (PROAMATINE ) 10 MG tablet Take 1 tablet (10 mg total) by mouth 3 (three) times daily with meals. 11/15/23   Monetta Redell PARAS, MD  risperidone  (RISPERDAL ) 4 MG tablet Take 4 mg by mouth at bedtime.    [provider]  trihexyphenidyl  (ARTANE ) 2 MG tablet Take 2 mg by mouth 2 (two) times daily. 02/12/20   [provider]      Allergies    Codeine    Review of Systems   Review of Systems  Cardiovascular:  Positive for chest pain.    Physical Exam Updated Vital Signs BP (!) 141/71   Pulse 66   Temp 98.2 F (36.8 C) (Oral)   Resp 14   Ht 5' 6 (1.676 m)   Wt 84 kg   SpO2 100%   BMI 29.89 kg/m  Physical Exam Constitutional:      General: He is not in acute distress. Cardiovascular:     Rate and Rhythm: Normal rate and regular rhythm.     Pulses: Normal pulses.     Heart sounds: Normal heart sounds. No murmur heard. Pulmonary:     Effort: Pulmonary effort is normal. No respiratory distress.     Breath sounds: Normal breath sounds.  Abdominal:  Palpations: Abdomen is soft.     Tenderness: There is no abdominal tenderness. There is no guarding or rebound.  Musculoskeletal:        General: No tenderness. Normal range of motion.     Right lower leg: No edema.     Left lower leg: No edema.  Skin:    General: Skin is warm and dry.     Capillary Refill: Capillary refill takes less than 2 seconds.  Neurological:     Mental Status: He is alert. Mental status is at baseline.  Psychiatric:        Mood and Affect: Mood normal.     ED Results / Procedures / Treatments   Labs (all labs ordered are listed, but only abnormal results are displayed) Labs Reviewed  BASIC METABOLIC PANEL - Abnormal; Notable for the following components:      Result  Value   CO2 16 (*)    Glucose, Bld 106 (*)    BUN 34 (*)    Creatinine, Ser 2.22 (*)    GFR, Estimated 34 (*)    All other components within normal limits  CBC - Abnormal; Notable for the following components:   WBC 11.8 (*)    RBC 3.57 (*)    Hemoglobin 10.0 (*)    HCT 30.5 (*)    Platelets 628 (*)    All other components within normal limits  I-STAT VENOUS BLOOD GAS, ED - Abnormal; Notable for the following components:   pH, Ven 7.464 (*)    pCO2, Ven 23.2 (*)    pO2, Ven 64 (*)    Bicarbonate 16.7 (*)    TCO2 17 (*)    Acid-base deficit 6.0 (*)    HCT 28.0 (*)    Hemoglobin 9.5 (*)    All other components within normal limits  BRAIN NATRIURETIC PEPTIDE  POC OCCULT BLOOD, ED  TROPONIN I (HIGH SENSITIVITY)  TROPONIN I (HIGH SENSITIVITY)    EKG EKG Interpretation Date/Time:  Monday November 29 2023 08:34:48 EST Ventricular Rate:  62 PR Interval:  52 QRS Duration:  133 QT Interval:  441 QTC Calculation: 448 R Axis:   42  Text Interpretation: Sinus rhythm Atrial premature complex Short PR interval Right bundle branch block Confirmed by Mannie Pac [phone]) on 11/29/2023 10:08:18 AM  Radiology DG Chest Port 1 View Result Date: 11/29/2023 CLINICAL DATA:  Chest pain EXAM: PORTABLE CHEST 1 VIEW COMPARISON:  X-ray 09/19/2021. FINDINGS: No consolidation, pneumothorax or effusion. Normal cardiopericardial silhouette without edema. Degenerative changes along the spine. Overlapping cardiac leads. IMPRESSION: No acute cardiopulmonary disease. Electronically Signed   By: Ranell Bring M.D.   On: 11/29/2023 10:07    Procedures Ultrasound ED Echo  Date/Time: 11/29/2023 10:53 AM  Performed by: Michael Lynwood DASEN, PA-C Authorized by: Michael Lynwood DASEN, PA-C   Procedure details:    Indications: chest pain     Views: subxiphoid, parasternal short axis view and apical 4 chamber view     Images: archived     Limitations:  Body habitus and acoustic shadowing Findings:     Pericardium: small pericardial effusion     LV Function: normal (>50% EF)     RV Diameter: normal     IVC: normal   Impression:    Impression: pericardial effusion present       Medications Ordered in ED Medications  sodium chloride  0.9 % bolus 500 mL (has no administration in time range)    ED Course/ Medical Decision Making/ A&P  Medical Decision Making Amount and/or Complexity of Data Reviewed Labs: ordered. Radiology: ordered.  Risk Decision regarding hospitalization.   Michael Mcknight 58 y.o. presented today for chest pain. Working DDx that I considered at this time includes, but not limited to, ACS, pulmonary embolism, community-acquired pneumonia, aortic dissection, pneumothorax, underlying bony abnormality, anemia, thyrotoxicosis, HTN urgency/emergency, esophageal rupture, CHF exacerbation, valvular disorder, myocarditis, pericarditis, endocarditis, pericardial effusion/cardiac tamponade, pulmonary edema, gastritis/PUD/GERD, esophagitis, MSK.  R/o Dx: ACS, pulmonary embolism, community-acquired pneumonia, aortic dissection, pneumothorax, underlying bony abnormality, anemia, thyrotoxicosis, HTN urgency/emergency, esophageal rupture, CHF exacerbation, valvular disorder, myocarditis, pericarditis, endocarditis, cardiac tamponade, pulmonary edema, gastritis/PUD/GERD, esophagitis, MSK, GI bleed, orthostatic hypotension: These are considered less likely due to history of present illness and physical exam findings. Aortic Dissection: less likely based on the location, quality, onset, and severity of symptoms in this case. Patient also has a lack of underlying history of AD or TAA.   Review of prior external notes: 11/02/2023  Unique Tests and My Interpretation:  EKG: Rate, rhythm, axis, intervals all examined and without medically relevant abnormality. ST segments without concerns for elevations Troponin: 8, 8 I-STAT VBG: Unremarkable BNP:  Unremarkable CXR: No acute findings CBC: Mild leukocytosis 11.8 BMP: CO2 16, creatinine 2.22, GFR 34; around baseline Occult blood: Negative  Social Determinants of Health: none  Discussion with Independent Historian:  EMS  Discussion of Management of Tests:  [place], MD Hospitalist  Risk: High: hospitalization or escalation of hospital-level care  Risk Stratification Score: none  Staffed with Mannie, DO  Plan: On exam patient was in no acute distress with stable vitals. Patient's physical was unremarkable. Labs and CXR will be ordered.  According to EMS patient suspects he has orthostatic hypotension due to the past few months of being dizzy after standing up and so we will get orthostatic vitals as well.  Patient stable at this time.  Patient's metabolic panel was around baseline however with a CO2 of 16 we will get VBG to make sure patient is not acidotic.  Patient's hemoglobin also has dropped 1.5 over the past 6 months and although patient does not endorse any hematochezia or melena or bright red blood per rectum will do rectal exam with chaperone.  Patient's vitals were also orthostatic in the sense that he dropped from 147-95 and is unable to ambulate.  Do anticipate admission due to unable to ambulate due to severe orthostatic hypotension.  Given patient's history of CHF reluctant to start patient on fluids here.  Bedside echo with done that shows small pericardial effusion similar to previous echo without signs of tamponade.  With a chaperone a rectal exam was conducted that was ultimately negative for occult GI bleed.  At this time patient is stable to be admitted as he is unable to ambulate due to his orthostatic hypotension.  Hospitalist consulted.  I spoke to the person hospitalist who recommended 500 mL of fluid and that he will come down and assessed the patient for admission.  Patient stable for admission.  This chart was dictated using voice recognition software.   Despite best efforts to proofread,  errors can occur which can change the documentation meaning.         Final Clinical Impression(s) / ED Diagnoses Final diagnoses:  Orthostatic hypotension  Pericardial effusion    Rx / DC Orders ED Discharge Orders     None         Michael Mcknight 11/29/23 1157    Mannie Pac T, DO 11/30/23 1022

## 2023-11-30 ENCOUNTER — Encounter (HOSPITAL_COMMUNITY): Payer: Self-pay | Admitting: Internal Medicine

## 2023-11-30 ENCOUNTER — Telehealth: Payer: Self-pay

## 2023-11-30 ENCOUNTER — Observation Stay (HOSPITAL_BASED_OUTPATIENT_CLINIC_OR_DEPARTMENT_OTHER): Payer: PPO

## 2023-11-30 DIAGNOSIS — N184 Chronic kidney disease, stage 4 (severe): Secondary | ICD-10-CM | POA: Diagnosis present

## 2023-11-30 DIAGNOSIS — I4892 Unspecified atrial flutter: Secondary | ICD-10-CM | POA: Diagnosis present

## 2023-11-30 DIAGNOSIS — Z91199 Patient's noncompliance with other medical treatment and regimen due to unspecified reason: Secondary | ICD-10-CM | POA: Diagnosis not present

## 2023-11-30 DIAGNOSIS — Z79899 Other long term (current) drug therapy: Secondary | ICD-10-CM | POA: Diagnosis not present

## 2023-11-30 DIAGNOSIS — F840 Autistic disorder: Secondary | ICD-10-CM | POA: Diagnosis present

## 2023-11-30 DIAGNOSIS — I4891 Unspecified atrial fibrillation: Secondary | ICD-10-CM

## 2023-11-30 DIAGNOSIS — G4733 Obstructive sleep apnea (adult) (pediatric): Secondary | ICD-10-CM | POA: Diagnosis present

## 2023-11-30 DIAGNOSIS — Z751 Person awaiting admission to adequate facility elsewhere: Secondary | ICD-10-CM | POA: Diagnosis not present

## 2023-11-30 DIAGNOSIS — I420 Dilated cardiomyopathy: Secondary | ICD-10-CM | POA: Diagnosis present

## 2023-11-30 DIAGNOSIS — R0609 Other forms of dyspnea: Secondary | ICD-10-CM

## 2023-11-30 DIAGNOSIS — F209 Schizophrenia, unspecified: Secondary | ICD-10-CM | POA: Diagnosis present

## 2023-11-30 DIAGNOSIS — I130 Hypertensive heart and chronic kidney disease with heart failure and stage 1 through stage 4 chronic kidney disease, or unspecified chronic kidney disease: Secondary | ICD-10-CM | POA: Diagnosis present

## 2023-11-30 DIAGNOSIS — F419 Anxiety disorder, unspecified: Secondary | ICD-10-CM | POA: Diagnosis present

## 2023-11-30 DIAGNOSIS — Z794 Long term (current) use of insulin: Secondary | ICD-10-CM | POA: Diagnosis not present

## 2023-11-30 DIAGNOSIS — I5042 Chronic combined systolic (congestive) and diastolic (congestive) heart failure: Secondary | ICD-10-CM | POA: Diagnosis present

## 2023-11-30 DIAGNOSIS — E876 Hypokalemia: Secondary | ICD-10-CM | POA: Diagnosis not present

## 2023-11-30 DIAGNOSIS — Z23 Encounter for immunization: Secondary | ICD-10-CM | POA: Diagnosis present

## 2023-11-30 DIAGNOSIS — E1122 Type 2 diabetes mellitus with diabetic chronic kidney disease: Secondary | ICD-10-CM | POA: Diagnosis present

## 2023-11-30 DIAGNOSIS — Z7952 Long term (current) use of systemic steroids: Secondary | ICD-10-CM | POA: Diagnosis not present

## 2023-11-30 DIAGNOSIS — I4510 Unspecified right bundle-branch block: Secondary | ICD-10-CM | POA: Diagnosis present

## 2023-11-30 DIAGNOSIS — G901 Familial dysautonomia [Riley-Day]: Secondary | ICD-10-CM | POA: Diagnosis present

## 2023-11-30 DIAGNOSIS — I951 Orthostatic hypotension: Secondary | ICD-10-CM | POA: Diagnosis present

## 2023-11-30 DIAGNOSIS — F32A Depression, unspecified: Secondary | ICD-10-CM | POA: Diagnosis present

## 2023-11-30 DIAGNOSIS — I480 Paroxysmal atrial fibrillation: Secondary | ICD-10-CM | POA: Diagnosis present

## 2023-11-30 DIAGNOSIS — I3139 Other pericardial effusion (noninflammatory): Secondary | ICD-10-CM | POA: Diagnosis present

## 2023-11-30 DIAGNOSIS — Z7901 Long term (current) use of anticoagulants: Secondary | ICD-10-CM | POA: Diagnosis not present

## 2023-11-30 DIAGNOSIS — R079 Chest pain, unspecified: Secondary | ICD-10-CM | POA: Diagnosis present

## 2023-11-30 HISTORY — DX: Orthostatic hypotension: I95.1

## 2023-11-30 LAB — BASIC METABOLIC PANEL
Anion gap: 8 (ref 5–15)
BUN: 32 mg/dL — ABNORMAL HIGH (ref 6–20)
CO2: 19 mmol/L — ABNORMAL LOW (ref 22–32)
Calcium: 9.5 mg/dL (ref 8.9–10.3)
Chloride: 110 mmol/L (ref 98–111)
Creatinine, Ser: 2.3 mg/dL — ABNORMAL HIGH (ref 0.61–1.24)
GFR, Estimated: 33 mL/min — ABNORMAL LOW (ref >60)
Glucose, Bld: 108 mg/dL — ABNORMAL HIGH (ref 70–99)
Potassium: 4.1 mmol/L (ref 3.5–5.1)
Sodium: 137 mmol/L (ref 135–145)

## 2023-11-30 LAB — ECHOCARDIOGRAM COMPLETE
AR max vel: 3.3 cm2
AV Area VTI: 3.28 cm2
AV Area mean vel: 2.95 cm2
AV Mean grad: 2.3 mm[Hg]
AV Peak grad: 3.7 mm[Hg]
Ao pk vel: 0.96 m/s
Area-P 1/2: 3.03 cm2
Calc EF: 55.7 %
Height: 65.984 in
MV VTI: 4.65 cm2
S' Lateral: 3.2 cm
Single Plane A2C EF: 51.6 %
Single Plane A4C EF: 58.9 %
Weight: 2970.04 [oz_av]

## 2023-11-30 LAB — GLUCOSE, CAPILLARY
Glucose-Capillary: 74 mg/dL (ref 70–99)
Glucose-Capillary: 83 mg/dL (ref 70–99)
Glucose-Capillary: 108 mg/dL — ABNORMAL HIGH (ref 70–99)
Glucose-Capillary: 99 mg/dL (ref 70–99)

## 2023-11-30 LAB — CBC
HCT: 28.6 % — ABNORMAL LOW (ref 39.0–52.0)
Hemoglobin: 9.4 g/dL — ABNORMAL LOW (ref 13.0–17.0)
MCH: 28.3 pg (ref 26.0–34.0)
MCHC: 32.9 g/dL (ref 30.0–36.0)
MCV: 86.1 fL (ref 80.0–100.0)
Platelets: 600 10*3/uL — ABNORMAL HIGH (ref 150–400)
RBC: 3.32 MIL/uL — ABNORMAL LOW (ref 4.22–5.81)
RDW: 13.3 % (ref 11.5–15.5)
WBC: 12.1 10*3/uL — ABNORMAL HIGH (ref 4.0–10.5)
nRBC: 0 % (ref 0.0–0.2)

## 2023-11-30 LAB — CORTISOL: Cortisol, Plasma: 16.6 ug/dL

## 2023-11-30 MED ORDER — SODIUM CHLORIDE 0.9 % IV SOLN: INTRAVENOUS | Status: AC

## 2023-11-30 MED ORDER — PNEUMOCOCCAL 20-VAL CONJ VACC 0.5 ML IM SUSY
0.5000 mL | PREFILLED_SYRINGE | INTRAMUSCULAR | Status: AC
Start: 2023-12-01 — End: 2023-12-01
  Administered 2023-12-01: 0.5 mL via INTRAMUSCULAR
  Filled 2023-11-30: qty 0.5

## 2023-11-30 MED ORDER — GADOBUTROL 1 MMOL/ML IV SOLN
8.0000 mL | Freq: Once | INTRAVENOUS | Status: AC | PRN
  Administered 2023-11-30: 8 mL via INTRAVENOUS

## 2023-11-30 MED ORDER — ENSURE ENLIVE PO LIQD
237.0000 mL | Freq: Two times a day (BID) | ORAL | Status: DC
  Administered 2023-12-01 – 2023-12-06 (×6): 237 mL via ORAL

## 2023-11-30 MED ORDER — INFLUENZA VIRUS VACC SPLIT PF (FLUZONE) 0.5 ML IM SUSY
0.5000 mL | PREFILLED_SYRINGE | INTRAMUSCULAR | Status: AC
Start: 2023-12-01 — End: 2023-12-01
  Administered 2023-12-01: 0.5 mL via INTRAMUSCULAR
  Filled 2023-11-30: qty 0.5

## 2023-11-30 MED ORDER — SODIUM CHLORIDE 0.9 % IV BOLUS
1000.0000 mL | Freq: Once | INTRAVENOUS | Status: AC
  Administered 2023-11-30: 1000 mL via INTRAVENOUS

## 2023-11-30 NOTE — Progress Notes (Signed)
  Echocardiogram 2D Echocardiogram has been performed.  Ocie Doyne RDCS 11/30/2023, 8:57 AM

## 2023-11-30 NOTE — Evaluation (Signed)
 Physical Therapy Evaluation Patient Details Name: Michael Mcknight MRN: [id number] DOB: 06-Oct-1966 Today's Date: 11/30/2023  History of Present Illness  Michael Mcknight is a 58 y.o. male who presented to the ED for evaluation of dizziness; Per sister, patient developed orthostatic hypotension with severe postural dizziness in August 2024, managed by PCP and Cardiologist; with medical history significant of A-fib/flutter on Eliquis , HTN, CKD stage IV, HFrecEF, OSA, orthostatic hypotension, dysautonomia, schizophrenia and autism spectrum disorder (Simultaneous filing. User may not have seen previous data.)  Clinical Impression   Pt admitted with above diagnosis. Lives at home with his sister and brother-in-law, in a single-level home with a few steps to enter; Prior to admission, pt was able to manage simple mobility and amb modified independently; Presents to PT with decr activity tolerance, a functional decline compared to baseline, and BP decr with incr time standing; Overall Supervision for bed mobility and simple transfers; Showing awareness of the need to self-monitor for activity tolerance;  Possible that more physical activity (instead of static standing) with help decr the decr in BP in upright standing -- will also consider using a rollater to walk, and focus on education to self-monitor for activity tolerance, so that pt can sit before BP drops more; Pt currently with functional limitations due to the deficits listed below (see PT Problem List). Pt will benefit from skilled PT to increase their independence and safety with mobility to allow discharge to the venue listed below.           If plan is discharge home, recommend the following: Assist for transportation;Help with stairs or ramp for entrance   Can travel by private vehicle        Equipment Recommendations Rollator (4 wheels);Other (comment) (will consider Rollator for the opportuntiy to sit whenever needed)  Recommendations  for Other Services       Functional Status Assessment Patient has had a recent decline in their functional status and demonstrates the ability to make significant improvements in function in a reasonable and predictable amount of time.     Precautions / Restrictions Precautions Precautions: Fall (Simultaneous filing. User may not have seen previous data.) Precaution Comments: Watch for signs of syncope/dizziness Restrictions Weight Bearing Restrictions Per Provider Order: No (Simultaneous filing. User may not have seen previous data.)      Mobility  Bed Mobility Overal bed mobility: Modified Independent                  Transfers Overall transfer level: Needs assistance (Simultaneous filing. User may not have seen previous data.) Equipment used: None (Simultaneous filing. User may not have seen previous data.) Transfers: Sit to/from Stand, Bed to chair/wheelchair/BSC (Simultaneous filing. User may not have seen previous data.) Sit to Stand: Supervision (Simultaneous filing. User may not have seen previous data.)   Step pivot transfers: Contact guard assist       General transfer comment: Good power up to stand, and no overt difficulty noted with pivot steps bed to recliner    Ambulation/Gait               General Gait Details: Deferred due to bP drop in standing, coupled with symptoms of dizziness  Stairs            Wheelchair Mobility     Tilt Bed    Modified Rankin (Stroke Patients Only)       Balance  Pertinent Vitals/Pain Pain Assessment Pain Assessment: No/denies pain (Simultaneous filing. User may not have seen previous data.)    Home Living Family/patient expects to be discharged to:: Private residence (Simultaneous filing. User may not have seen previous data.) Living Arrangements: Other (Comment) (sister and brother in law  Simultaneous filing. User may not have seen  previous data.) Available Help at Discharge: Family;Available 24 hours/day (Simultaneous filing. User may not have seen previous data.) Type of Home: House (Simultaneous filing. User may not have seen previous data.) Home Access: Level entry (Simultaneous filing. User may not have seen previous data.)       Home Layout: One level (Simultaneous filing. User may not have seen previous data.) Home Equipment: None (Simultaneous filing. User may not have seen previous data.)      Prior Function Prior Level of Function : Independent/Modified Independent (Simultaneous filing. User may not have seen previous data.)             Mobility Comments: no AD use (Simultaneous filing. User may not have seen previous data.) ADLs Comments: ind, sister does meds (Simultaneous filing. User may not have seen previous data.)     Extremity/Trunk Assessment   Upper Extremity Assessment Upper Extremity Assessment: Defer to OT evaluation (Simultaneous filing. User may not have seen previous data.)    Lower Extremity Assessment Lower Extremity Assessment: Overall WFL for tasks assessed (adequate strength to poswer up to stand and maintain standing posture witout UE supoprt  Simultaneous filing. User may not have seen previous data.)    Cervical / Trunk Assessment Cervical / Trunk Assessment: Normal  Communication   Communication Communication: No apparent difficulties (Simultaneous filing. User may not have seen previous data.)  Cognition Arousal: Alert (Simultaneous filing. User may not have seen previous data.) Behavior During Therapy: [facility] for tasks assessed/performed, Flat affect (Simultaneous filing. User may not have seen previous data.) Overall Cognitive Status: No family/caregiver present to determine baseline cognitive functioning (Simultaneous filing. User may not have seen previous data.)                                 General Comments: Seemed to get anxious at times during  session        General Comments General comments (skin integrity, edema, etc.):    11/30/23 1100  Orthostatic Lying   BP- Lying 134/73  Pulse- Lying 79  Orthostatic Sitting  BP- Sitting 99/59  Pulse- Sitting 83  Orthostatic Standing at 0 minutes  BP- Standing at 0 minutes  (Unable to get a reading)  Orthostatic Standing at 3 minutes  BP- Standing at 3 minutes (!) 88/64  Pulse- Standing at 3 minutes 106 (Symptomatic for dizziness)       Exercises     Assessment/Plan    PT Assessment Patient needs continued PT services  PT Problem List Decreased activity tolerance;Decreased balance;Decreased mobility;Decreased cognition;Decreased knowledge of use of DME;Decreased safety awareness;Decreased knowledge of precautions       PT Treatment Interventions DME instruction;Gait training;Stair training;Functional mobility training;Therapeutic activities;Therapeutic exercise;Balance training;Neuromuscular re-education;Cognitive remediation;Patient/family education    PT Goals (Current goals can be found in the Care Plan section)  Acute Rehab PT Goals Patient Stated Goal: Does not want to fall again PT Goal Formulation: With patient Time For Goal Achievement: 12/14/23 Potential to Achieve Goals: Good    Frequency Min 1X/week     Co-evaluation  AM-PAC PT 6 Clicks Mobility  Outcome Measure Help needed turning from your back to your side while in a flat bed without using bedrails?: None Help needed moving from lying on your back to sitting on the side of a flat bed without using bedrails?: None Help needed moving to and from a bed to a chair (including a wheelchair)?: A Little Help needed standing up from a chair using your arms (e.g., wheelchair or bedside chair)?: A Little Help needed to walk in hospital room?: A Little Help needed climbing 3-5 steps with a railing? : A Lot 6 Click Score: 19    End of Session   Activity Tolerance: Patient tolerated  treatment well;Other (comment) (but with BP drop with incr time in standing) Patient left: in chair;with call bell/phone within reach;with chair alarm set Nurse Communication: Mobility status;Other (comment) (orthostatics in flowsheets) PT Visit Diagnosis: Other abnormalities of gait and mobility (R26.89);Dizziness and giddiness (R42)    Time: 8671-8641 PT Time Calculation (min) (ACUTE ONLY): 30 min   Charges:   PT Evaluation $PT Eval Low Complexity: 1 Low PT Treatments $Therapeutic Activity: 8-22 mins PT General Charges $$ ACUTE PT VISIT: 1 Visit         Silvano Currier, PT  Acute Rehabilitation Services Office [phone] Secure Chat welcomed   Silvano VEAR Currier 11/30/2023, 3:01 PM

## 2023-11-30 NOTE — Progress Notes (Signed)
 PROGRESS NOTE    DRAVYN SEVERS  FMW:[id number] DOB: 16-Aug-1966 DOA: 11/29/2023 PCP: Orlando Dwayne NOVAK, FNP    Brief Narrative:   Michael Mcknight is a 58 y.o. male with past medical history significant for A-fib/flutter on Eliquis , HTN, CKD stage IV, chronic systolic congestive heart failure, orthostatic hypotension with dysautonomia, schizophrenia, autism spectrum disorder, OSA who presented to [facility] ED on 1/13 via EMS for complaints of chest pain, dizziness. Per sister, patient developed orthostatic hypotension with severe postural dizziness in August 2024. This is being managed by his PCP and cardiology. Patient continues to be dizzy even with changes to his medications and has had multiple falls secondary to the dizziness. This morning, patient was scheduled to get MRI (unsure what type of MRI) ordered by his PCP. They sat patient in the chair and when he tried to get up, he started having significant dizziness, waving his hands in the air so they called EMS. Patient reports that he has been having dizzy spells almost every day mostly when he stands up or sometimes even while laying down. He describes the spells as lightheadedness however according to sister he does have episodes that looks like the room spinning. He denies any chest pain, shortness of breath, palpitations, headaches or vision changes.   On EMS arrival, patient reported some chest discomfort so he was given ASA 325 mg x 1. He was found to be orthostatic with drop in his SBP to the 90s upon standing.  12-lead was unremarkable.  In the ED, temperature 98.2 F, HR 66, RR 17, BP 136/88, SpO2 100% on room air.  Orthostatic vital signs positive in the ED with SBP dropping from 147 to 95.  WBC 11.8, hemoglobin 10.0, platelet count 628.  Sodium 135, potassium 4.2, chloride 105, CO2 16, glucose 106, BUN 34, creatinine 2.22.  BNP 64.5.  High sensitive troponin 8> 8.  Chest x-ray with no acute cardiopulmonary process.  FOBT negative.  TSH  1.372.  Random cortisol 16.6.  VBG with pH 7.46, pCO2 22.2, PaO2 64, bicarb 16.7.  MRI brain with no acute finding, moderate spinal canal stenosis C 3-4 level.  EKG with sinus rhythm, PACs and RBBB.  Patient unable to ambulate due to his severe postural dizziness.  TRH consulted for admission.  Assessment & Plan:   Orthostatic hypotension with associated dysautonomia Patient with history of persistent orthostatic hypotension here for evaluation after had a severe postural dizziness this morning prior to going for an MRI. He is significantly orthostatic positive on admission with difficulty getting up due to the dizziness. SBP dropped from 147 lying down to 100 upon standing then 108 upon standing for 3. On chart review, during patient's ER visit at [facility] in November, a CT head showed Soft tissue attenuation adjacent in the bilateral Prussak space, right greater than left, which could represent cholesteatomas. This can be an etiology of dizziness. Further evaluation with nonemergent/outpatient MRI is recommended. He was seen by his cardiology in November and was started on Florinef . He followed up with cardiology 2 weeks ago due to persistent postural dizziness and his midodrine  was increased to 10 mg with plan to increase his droxidopa  to 200 mg if symptoms do not improve.  MR brain without contrast with no acute findings.  Cortisol level and TSH within normal limits. -- Midodrine  10 mg PO TID -- Droxidopa  100 mg PO TID -- Florinef  increased from 0.1 to 0.2 mg PO daily -- 1 L NS bolus followed by NS at  75 mL/h -- Repeat orthostatic vital signs in the morning -- Fall precautions  Chronic diastolic congestive heart failure Hx HFrecHF Patient with previous decreased LV function with a EF 30-35% November 2020, now increased to 55-6% on TTE October 2023.  Repeat TTE this hospitalization with LVEF 55-60%, no LV regional wall motion maladies, LV diastolic parameters indeterminate, small  pericardial effusion, no aortic stenosis, IVC normal in size.  History of paroxysmal atrial fibrillation/flutter History of right bundle branch block G notable for sinus rhythm, with PACs and RBBB.  -- Eliquis  5 mg PO BID  Type 2 diabetes mellitus Patient was recently discontinued off of insulin  after losing significant amount of weight.  Hemoglobin A1c -- SSI for coverage -- CBGs qAC/HS  CKD stage IV Creatinine 2.22 on admission, recently 2.14; three weeks prior. -- Cr 2.22>2.30 -- Avoid nephrotoxins, renal dose all medications -- BMP daily  Anxiety/depression: -- Lexapro  10 mg p.o. daily  Schizophrenia Patient currently on  risperidone  and trihexyphenidyl . Per sister, he has been on these medications for years and they preceded his orthostatic hypotension.  This meds are known causes of orthostatic hypotension. If the etiology of his orthostatic hypotension are not found, he will likely need to be weaned off slowly in the outpatient. -- Continue on risperidone  and  trihexyphenidyl  for now -- Outpatient follow-up with behavioral health  OSA -- Continue nocturnal CPAP  Weakness/debility/deconditioning: -- PT/OT recommend home health -- Continue therapy efforts while inpatient -- TOC consulted   DVT prophylaxis:  apixaban  (ELIQUIS ) tablet 5 mg    Code Status: Full Code Family Communication: No family present at bedside this morning, attempted to update patient's sister Michael Mcknight via telephone, no answer  Disposition Plan:  Level of care: Telemetry Medical Status is: Inpatient Remains inpatient appropriate because: Persistent orthostasis, uptitrating Florinef , therapy evaluation    Consultants:  None  Procedures:  TTE  Antimicrobials:  None   Subjective: Patient seen examined bedside, resting calmly.  Lying in bed.  No family present.  No specific complaints this morning.  Seen by PT with positive orthostatic vital signs.  Will give IV fluid bolus.  Recently  uptitrated Florinef  to 0.2 mg p.o. daily.  Denies headache, no chest pain, no palpitations, no shortness of breath, no abdominal pain, no fever, no nausea/vomiting/diarrhea, no paresthesias.  No acute events overnight per nursing staff.  Objective: Vitals:   11/30/23 0400 11/30/23 0530 11/30/23 0531 11/30/23 0752  BP:  112/78  111/84  Pulse:  88  99  Resp:   18 19  Temp: 98.5 F (36.9 C) 98 F (36.7 C)    TempSrc:      SpO2: 100% 100%  100%  Weight:      Height:        Intake/Output Summary (Last 24 hours) at 11/30/2023 1522 Last data filed at 11/30/2023 0800 Gross per 24 hour  Intake 580 ml  Output 0 ml  Net 580 ml   Filed Weights   11/29/23 0832 11/29/23 1716  Weight: 84 kg 84.2 kg    Examination:  Physical Exam: GEN: NAD, alert HEENT: NCAT, PERRL, EOMI, sclera clear, MMM PULM: CTAB w/o wheezes/crackles, normal respiratory effort, on room air CV: RRR w/o M/G/R GI: abd soft, NTND, NABS, no R/G/M MSK: no peripheral edema, moves all extremities independently NEURO: No focal neurological deficits PSYCH: normal mood/affect Integumentary: No concerning rashes/lesions/wounds noted on exposed skin surface    Data Reviewed: I have personally reviewed following labs and imaging studies  CBC: Recent Labs  Lab 11/29/23 0951 11/29/23 1041 11/30/23 0410  WBC 11.8*  --  12.1*  HGB 10.0* 9.5* 9.4*  HCT 30.5* 28.0* 28.6*  MCV 85.4  --  86.1  PLT 628*  --  600*   Basic Metabolic Panel: Recent Labs  Lab 11/29/23 0852 11/29/23 1041 11/29/23 1335 11/30/23 0410  NA 135 139  --  137  K 4.2 3.8  --  4.1  CL 105  --   --  110  CO2 16*  --   --  19*  GLUCOSE 106*  --   --  108*  BUN 34*  --   --  32*  CREATININE 2.22*  --   --  2.30*  CALCIUM 10.2  --   --  9.5  MG  --   --  2.1  --   PHOS  --   --  3.4  --    GFR: Estimated Creatinine Clearance: 36.5 mL/min (A) (by C-G formula based on SCr of 2.3 mg/dL (H)). Liver Function Tests: No results for input(s): AST,  ALT, ALKPHOS, BILITOT, PROT, ALBUMIN  in the last 168 hours. No results for input(s): LIPASE, AMYLASE in the last 168 hours. No results for input(s): AMMONIA in the last 168 hours. Coagulation Profile: No results for input(s): INR, PROTIME in the last 168 hours. Cardiac Enzymes: No results for input(s): CKTOTAL, CKMB, CKMBINDEX, TROPONINI in the last 168 hours. BNP (last 3 results) No results for input(s): PROBNP in the last 8760 hours. HbA1C: No results for input(s): HGBA1C in the last 72 hours. CBG: Recent Labs  Lab 11/29/23 1721 11/29/23 2052 11/30/23 0753 11/30/23 1158  GLUCAP 79 108* 74 83   Lipid Profile: No results for input(s): CHOL, HDL, LDLCALC, TRIG, CHOLHDL, LDLDIRECT in the last 72 hours. Thyroid  Function Tests: Recent Labs    11/29/23 1335  TSH 1.372   Anemia Panel: Recent Labs    11/29/23 1335  VITAMINB12 290   Sepsis Labs: No results for input(s): PROCALCITON, LATICACIDVEN in the last 168 hours.  No results found for this or any previous visit (from the past 240 hours).       Radiology Studies: ECHOCARDIOGRAM COMPLETE Result Date: 11/30/2023    ECHOCARDIOGRAM REPORT   Patient Name:   KAMRIN SIBLEY Emmitt Date of Exam: 11/30/2023 Medical Rec #:  [id number]        Height:       66.0 in Accession #:    [id number]       Weight:       185.6 lb Date of Birth:  1966-02-03        BSA:          1.937 m Patient Age:    57 years         BP:           111/84 mmHg Patient Gender: M                HR:           90 bpm. Exam Location:  Inpatient Procedure: 2D Echo, Cardiac Doppler and Color Doppler Indications:    Dyspna, A-fib  History:        Patient has prior history of Echocardiogram examinations, most                 recent 08/27/2022. Cardiomegaly and Cardiomyopathy;                 Arrythmias:Atrial Fibrillation and Atrial Flutter.  Sonographer:  Sabrina Gentry Referring Phys: [id number] PROSPER M AMPONSAH IMPRESSIONS  1. Left  ventricular ejection fraction, by estimation, is 55 to 60%. The left ventricle has normal function. The left ventricle has no regional wall motion abnormalities. Left ventricular diastolic parameters are indeterminate.  2. Right ventricular systolic function is normal. The right ventricular size is normal. Tricuspid regurgitation signal is inadequate for assessing PA pressure.  3. A small pericardial effusion is present. The pericardial effusion is posterior to the left ventricle.  4. The mitral valve is grossly normal. Trivial mitral valve regurgitation. No evidence of mitral stenosis.  5. The aortic valve was not well visualized. Aortic valve regurgitation is not visualized. No aortic stenosis is present.  6. The inferior vena cava is normal in size with greater than 50% respiratory variability, suggesting right atrial pressure of 3 mmHg. Comparison(s): No significant change from prior study. Prior images reviewed side by side. FINDINGS  Left Ventricle: Left ventricular ejection fraction, by estimation, is 55 to 60%. The left ventricle has normal function. The left ventricle has no regional wall motion abnormalities. The left ventricular internal cavity size was normal in size. There is  no left ventricular hypertrophy. Left ventricular diastolic parameters are indeterminate. Right Ventricle: The right ventricular size is normal. No increase in right ventricular wall thickness. Right ventricular systolic function is normal. Tricuspid regurgitation signal is inadequate for assessing PA pressure. Left Atrium: Left atrial size was normal in size. Right Atrium: Right atrial size was normal in size. Pericardium: A small pericardial effusion is present. The pericardial effusion is posterior to the left ventricle. Presence of epicardial fat layer. Mitral Valve: The mitral valve is grossly normal. Trivial mitral valve regurgitation. No evidence of mitral valve stenosis. MV peak gradient, 1.1 mmHg. The mean mitral valve  gradient is 1.0 mmHg. Tricuspid Valve: The tricuspid valve is normal in structure. Tricuspid valve regurgitation is not demonstrated. No evidence of tricuspid stenosis. Aortic Valve: The aortic valve was not well visualized. Aortic valve regurgitation is not visualized. No aortic stenosis is present. Aortic valve mean gradient measures 2.3 mmHg. Aortic valve peak gradient measures 3.7 mmHg. Aortic valve area, by VTI measures 3.28 cm. Pulmonic Valve: The pulmonic valve was normal in structure. Pulmonic valve regurgitation is not visualized. No evidence of pulmonic stenosis. Aorta: The aortic root and ascending aorta are structurally normal, with no evidence of dilitation. Venous: The inferior vena cava is normal in size with greater than 50% respiratory variability, suggesting right atrial pressure of 3 mmHg. IAS/Shunts: The atrial septum is grossly normal.  LEFT VENTRICLE PLAX 2D LVIDd:         5.40 cm      Diastology LVIDs:         3.20 cm      LV e' medial:    5.33 cm/s LV PW:         0.80 cm      LV E/e' medial:  10.8 LV IVS:        0.70 cm      LV e' lateral:   5.98 cm/s LVOT diam:     2.00 cm      LV E/e' lateral: 9.6 LV SV:         50 LV SV Index:   26 LVOT Area:     3.14 cm  LV Volumes (MOD) LV vol d, MOD A2C: 108.0 ml LV vol d, MOD A4C: 145.0 ml LV vol s, MOD A2C: 52.3 ml LV vol s, MOD A4C: 59.6 ml  LV SV MOD A2C:     55.7 ml LV SV MOD A4C:     145.0 ml LV SV MOD BP:      71.0 ml RIGHT VENTRICLE             IVC RV S prime:     15.80 cm/s  IVC diam: 1.00 cm TAPSE (M-mode): 2.0 cm LEFT ATRIUM           Index LA diam:      3.00 cm 1.55 cm/m LA Vol (A2C): 32.3 ml 16.67 ml/m LA Vol (A4C): 42.3 ml 21.83 ml/m  AORTIC VALVE                    PULMONIC VALVE AV Area (Vmax):    3.30 cm     PV Vmax:       0.78 m/s AV Area (Vmean):   2.95 cm     PV Peak grad:  2.4 mmHg AV Area (VTI):     3.28 cm AV Vmax:           96.07 cm/s AV Vmean:          68.500 cm/s AV VTI:            0.153 m AV Peak Grad:      3.7 mmHg AV  Mean Grad:      2.3 mmHg LVOT Vmax:         101.00 cm/s LVOT Vmean:        64.400 cm/s LVOT VTI:          0.160 m LVOT/AV VTI ratio: 1.04  AORTA Ao Root diam: 3.30 cm Ao Asc diam:  3.20 cm MITRAL VALVE MV Area (PHT): 3.03 cm    SHUNTS MV Area VTI:   4.65 cm    Systemic VTI:  0.16 m MV Peak grad:  1.1 mmHg    Systemic Diam: 2.00 cm MV Mean grad:  1.0 mmHg MV Vmax:       0.52 m/s MV Vmean:      34.6 cm/s MV Decel Time: 250 msec MV E velocity: 57.30 cm/s MV A velocity: 70.80 cm/s MV E/A ratio:  0.81 Stanly Leavens MD Electronically signed by Stanly Leavens MD Signature Date/Time: 11/30/2023/11:04:24 AM    Final    MR BRAIN W WO CONTRAST Result Date: 11/30/2023 CLINICAL DATA:  Dizziness EXAM: MRI HEAD WITHOUT AND WITH CONTRAST TECHNIQUE: Multiplanar, multiecho pulse sequences of the brain and surrounding structures were obtained without and with intravenous contrast. CONTRAST:  8mL GADAVIST  GADOBUTROL  1 MMOL/ML IV SOLN COMPARISON:  None Available. FINDINGS: Brain: No acute infarct, mass effect or extra-axial collection. No acute or chronic hemorrhage. Normal white matter signal, parenchymal volume and CSF spaces. The midline structures are normal. There is no abnormal contrast enhancement. Vascular: Normal flow voids. Skull and upper cervical spine: Moderate spinal canal stenosis at the C3-4 level. Sinuses/Orbits:No paranasal sinus fluid levels or advanced mucosal thickening. No mastoid or middle ear effusion. Normal orbits. IMPRESSION: 1. Normal MRI of the brain. 2. Moderate spinal canal stenosis at the C3-4 level. Electronically Signed   By: Franky Stanford M.D.   On: 11/30/2023 01:56   DG Chest Port 1 View Result Date: 11/29/2023 CLINICAL DATA:  Chest pain EXAM: PORTABLE CHEST 1 VIEW COMPARISON:  X-ray 09/19/2021. FINDINGS: No consolidation, pneumothorax or effusion. Normal cardiopericardial silhouette without edema. Degenerative changes along the spine. Overlapping cardiac leads. IMPRESSION: No  acute cardiopulmonary disease. Electronically Signed   By: Ranell Bring  M.D.   On: 11/29/2023 10:07        Scheduled Meds:  apixaban   5 mg Oral BID   droxidopa   100 mg Oral TID WC   escitalopram   10 mg Oral Daily   fludrocortisone   0.2 mg Oral Daily   insulin  aspart  0-15 Units Subcutaneous TID WC   midodrine   10 mg Oral TID WC   risperidone   4 mg Oral QHS   trihexyphenidyl   2 mg Oral BID   Continuous Infusions:  sodium chloride      Followed by   sodium chloride        LOS: 0 days    Time spent: 52 minutes spent on chart review, discussion with nursing staff, consultants, updating family and interview/physical exam; more than 50% of that time was spent in counseling and/or coordination of care.    Michael PARAS Kainon Varady, DO [facility] Available via Epic secure chat 7am-7pm After these hours, please refer to coverage provider listed on [email or web address] 11/30/2023, 3:22 PM

## 2023-11-30 NOTE — Progress Notes (Signed)
 Notified DR. Margo Aye that patient had 2 runs of Vtach, one 6 beat and one 5 beat, patient is asymptomatic and vitally stable, resting in bed at both times. Keep patient on telemetry monitoring per Dr. Margo Aye.

## 2023-11-30 NOTE — Plan of Care (Signed)
  Problem: Clinical Measurements: Goal: Cardiovascular complication will be avoided Outcome: Not Progressing   Problem: Clinical Measurements: Goal: Diagnostic test results will improve Outcome: Not Progressing   Problem: Clinical Measurements: Goal: Ability to maintain clinical measurements within normal limits will improve Outcome: Not Progressing   Problem: Activity: Goal: Risk for activity intolerance will decrease Outcome: Not Progressing   Problem: Safety: Goal: Ability to remain free from injury will improve Outcome: Not Progressing

## 2023-11-30 NOTE — TOC CM/SW Note (Signed)
 Transition of Care [facility]) - Inpatient Brief Assessment   Patient Details  Name: Michael Mcknight MRN: [id number] Date of Birth: 1965/12/08  [facility]) CM/SW Contact:    Tom-Johnson, Harvest Muskrat, RN Phone Number: 11/30/2023, 5:10 PM   Clinical Narrative:  Patient presented to the ED with  Chest Pain and Dizziness, admitted with Orthostatic Hypotension. Patient has significant hx of A-fib/flutter on Eliquis , CKD stage IV, HF, HTN, OSA, Orthostatic Hypotension, Dysautonomia, Schizophrenia and Autism Spectrum disorder.    From home with sister and brother in-law. Does not have children has two supportive siblings. 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  PCP is Orlando Dwayne NOVAK, FNP and uses [facility] on [address] in [place].  Home Health recommended, patient has no preference. CM sent referral to [facility] and Amy noted acceptance, info on AVS.   CM will continue to follow as patient progresses with care towards discharge.            Transition of Care Asessment: Insurance and Status: Insurance coverage has been reviewed Patient has primary care physician: Yes Home environment has been reviewed: Yes Prior level of function:: Independent Prior/Current Home Services: No current home services Social Drivers of Health Review: SDOH reviewed no interventions necessary Readmission risk has been reviewed: Yes Transition of care needs: transition of care needs identified, TOC will continue to follow

## 2023-11-30 NOTE — Progress Notes (Signed)
 Patient back from MRI, bed alarm turned on, hooked back on telemetry.

## 2023-11-30 NOTE — Evaluation (Addendum)
 Occupational Therapy Evaluation Patient Details Name: Michael Mcknight MRN: [id number] DOB: 13-Oct-1966 Today's Date: 11/30/2023   History of Present Illness 58 y/o F presentign to ED on 1/13 with chest discomfort and dizzness; admitted for orthostatic hypotension      PMH includes orthostatic hypotension, dysautonomia, schizophrenia, autism spectrum disorder   Clinical Impression   Pt reports ind at baseline with ADL/functional mobility, lives with sister who assists with IADLs. Pt currently with +orthostatics and mild unsteadiness during session, though denies symptoms. Pt needing up to min A for ADLs, mod I for bed mobility and CGA for transfers without AD. Pt intermittently reaching for external support during session SBP 80-105 during session. Pt presenting with impairments listed below, will follow acutely. Recommend HHOT at d/c.    BP seated 105/59 BP standing 82/56 BP x3 min standing 86/67 BP seated after bathroom transfer 92/69      If plan is discharge home, recommend the following: A little help with walking and/or transfers;A little help with bathing/dressing/bathroom;Assistance with cooking/housework;Direct supervision/assist for financial management;Direct supervision/assist for medications management;Assist for transportation    Functional Status Assessment  Patient has had a recent decline in their functional status and demonstrates the ability to make significant improvements in function in a reasonable and predictable amount of time.  Equipment Recommendations  None recommended by OT    Recommendations for Other Services PT consult     Precautions / Restrictions Precautions Precautions: Fall Restrictions Weight Bearing Restrictions Per Provider Order: No      Mobility Bed Mobility               General bed mobility comments: OOB in chair upon arrival and departure    Transfers Overall transfer level: Needs assistance Equipment used: None Transfers:  Sit to/from Stand Sit to Stand: Contact guard assist                  Balance Overall balance assessment: Needs assistance Sitting-balance support: Feet supported Sitting balance-Leahy Scale: Good     Standing balance support: During functional activity Standing balance-Leahy Scale: Fair Standing balance comment: reaching for walls/bedrails for support                           ADL either performed or assessed with clinical judgement   ADL Overall ADL's : Needs assistance/impaired Eating/Feeding: Set up;Sitting   Grooming: Set up;Standing   Upper Body Bathing: Standing;Minimal assistance   Lower Body Bathing: Sitting/lateral leans;Minimal assistance   Upper Body Dressing : Minimal assistance   Lower Body Dressing: Minimal assistance   Toilet Transfer: Contact guard assist;Ambulation   Toileting- Clothing Manipulation and Hygiene: Contact guard assist       Functional mobility during ADLs: Contact guard assist       Vision   Vision Assessment?: No apparent visual deficits     Perception Perception: Not tested       Praxis Praxis: Not tested       Pertinent Vitals/Pain Pain Assessment Pain Assessment: No/denies pain     Extremity/Trunk Assessment Upper Extremity Assessment Upper Extremity Assessment: Generalized weakness   Lower Extremity Assessment Lower Extremity Assessment: Defer to PT evaluation   Cervical / Trunk Assessment Cervical / Trunk Assessment: Normal   Communication Communication Communication: No apparent difficulties   Cognition Arousal: Alert Behavior During Therapy: WFL for tasks assessed/performed, Flat affect Overall Cognitive Status: Within Functional Limits for tasks assessed  General Comments  see note for BP measures +orthostatic but not symptomatic    Exercises     Shoulder Instructions      Home Living Family/patient expects to be discharged  to:: Private residence Living Arrangements: Other (Comment) (sister and brother in law) Available Help at Discharge: Family;Available 24 hours/day Type of Home: House Home Access: Level entry     Home Layout: One level     Bathroom Shower/Tub: Runner, Broadcasting/film/video: None          Prior Functioning/Environment Prior Level of Function : Independent/Modified Independent             Mobility Comments: no AD use ADLs Comments: ind, sister does meds        OT Problem List: Decreased strength;Decreased range of motion;Decreased activity tolerance;Impaired balance (sitting and/or standing);Decreased safety awareness;Cardiopulmonary status limiting activity      OT Treatment/Interventions: Self-care/ADL training;Therapeutic exercise;Energy conservation;DME and/or AE instruction;Therapeutic activities;Patient/family education;Visual/perceptual remediation/compensation;Balance training    OT Goals(Current goals can be found in the care plan section) Acute Rehab OT Goals Patient Stated Goal: none stated OT Goal Formulation: With patient Time For Goal Achievement: 12/14/23 Potential to Achieve Goals: Good ADL Goals Pt Will Perform Upper Body Dressing: with modified independence;sitting Pt Will Perform Lower Body Dressing: with modified independence;sitting/lateral leans;sit to/from stand Pt Will Transfer to Toilet: with modified independence;ambulating;regular height toilet Pt Will Perform Tub/Shower Transfer: Tub transfer;Shower transfer;with modified independence;ambulating  OT Frequency: Min 1X/week    Co-evaluation              AM-PAC OT 6 Clicks Daily Activity     Outcome Measure Help from another person eating meals?: A Little Help from another person taking care of personal grooming?: A Little Help from another person toileting, which includes using toliet, bedpan, or urinal?: A Little Help from another person bathing (including washing,  rinsing, drying)?: A Little Help from another person to put on and taking off regular upper body clothing?: A Little Help from another person to put on and taking off regular lower body clothing?: A Little 6 Click Score: 18   End of Session Equipment Utilized During Treatment: Gait belt Nurse Communication: Mobility status  Activity Tolerance: Patient tolerated treatment well Patient left: with call bell/phone within reach;in chair;with chair alarm set  OT Visit Diagnosis: Unsteadiness on feet (R26.81);Other abnormalities of gait and mobility (R26.89);Muscle weakness (generalized) (M62.81)                Time: 8689-8672 OT Time Calculation (min): 17 min Charges:  OT General Charges $OT Visit: 1 Visit OT Evaluation $OT Eval Low Complexity: 1 Low  Rylann Munford K, OTD, OTR/L SecureChat Preferred Acute Rehab [phone]   Earon Rivest K Koonce 11/30/2023, 3:01 PM

## 2023-11-30 NOTE — Telephone Encounter (Signed)
 Received the following message from Darlene Humble regarding the patient:  We never heard from home health referral to date. I call and [facility] home health stated they never got an order. Is this something we /I can follow up on.  Darlene explained that Michael Mcknight was currently in the hospital at [facility] and she would send another message regarding setting up home health when he gets out of the hospital. I explained that since he was in the hospital they may be able to set-up his home health through the hospital before he is discharged. Darlene stated she would look into that. Darlene had no further questions at this time.

## 2023-11-30 NOTE — Progress Notes (Signed)
   11/30/23 2039  BiPAP/CPAP/SIPAP  BiPAP/CPAP/SIPAP Pt Type Adult  Reason BIPAP/CPAP not in use Non-compliant

## 2023-12-01 ENCOUNTER — Telehealth (HOSPITAL_COMMUNITY): Payer: Self-pay | Admitting: Internal Medicine

## 2023-12-01 DIAGNOSIS — I951 Orthostatic hypotension: Secondary | ICD-10-CM | POA: Diagnosis not present

## 2023-12-01 LAB — GLUCOSE, CAPILLARY
Glucose-Capillary: 88 mg/dL (ref 70–99)
Glucose-Capillary: 105 mg/dL — ABNORMAL HIGH (ref 70–99)
Glucose-Capillary: 104 mg/dL — ABNORMAL HIGH (ref 70–99)
Glucose-Capillary: 127 mg/dL — ABNORMAL HIGH (ref 70–99)

## 2023-12-01 LAB — CBC
HCT: 28.5 % — ABNORMAL LOW (ref 39.0–52.0)
Hemoglobin: 9.5 g/dL — ABNORMAL LOW (ref 13.0–17.0)
MCH: 28.3 pg (ref 26.0–34.0)
MCHC: 33.3 g/dL (ref 30.0–36.0)
MCV: 84.8 fL (ref 80.0–100.0)
Platelets: 569 10*3/uL — ABNORMAL HIGH (ref 150–400)
RBC: 3.36 MIL/uL — ABNORMAL LOW (ref 4.22–5.81)
RDW: 13.1 % (ref 11.5–15.5)
WBC: 11.2 10*3/uL — ABNORMAL HIGH (ref 4.0–10.5)
nRBC: 0 % (ref 0.0–0.2)

## 2023-12-01 LAB — HEMOGLOBIN A1C
Hgb A1c MFr Bld: 8.1 % — ABNORMAL HIGH (ref 4.8–5.6)
Mean Plasma Glucose: 185.77 mg/dL

## 2023-12-01 LAB — BASIC METABOLIC PANEL
Anion gap: 8 (ref 5–15)
BUN: 32 mg/dL — ABNORMAL HIGH (ref 6–20)
CO2: 17 mmol/L — ABNORMAL LOW (ref 22–32)
Calcium: 9.4 mg/dL (ref 8.9–10.3)
Chloride: 113 mmol/L — ABNORMAL HIGH (ref 98–111)
Creatinine, Ser: 2.01 mg/dL — ABNORMAL HIGH (ref 0.61–1.24)
GFR, Estimated: 38 mL/min — ABNORMAL LOW (ref >60)
Glucose, Bld: 98 mg/dL (ref 70–99)
Potassium: 3.4 mmol/L — ABNORMAL LOW (ref 3.5–5.1)
Sodium: 138 mmol/L (ref 135–145)

## 2023-12-01 MED ORDER — DROXIDOPA 100 MG PO CAPS
200.0000 mg | ORAL_CAPSULE | Freq: Three times a day (TID) | ORAL | Status: DC
  Administered 2023-12-01 – 2023-12-06 (×15): 200 mg via ORAL
  Filled 2023-12-01 (×16): qty 2

## 2023-12-01 MED ORDER — POTASSIUM CHLORIDE CRYS ER 20 MEQ PO TBCR
40.0000 meq | EXTENDED_RELEASE_TABLET | Freq: Once | ORAL | Status: AC
  Administered 2023-12-01: 40 meq via ORAL
  Filled 2023-12-01: qty 2

## 2023-12-01 MED ORDER — SODIUM CHLORIDE 0.9 % IV SOLN: INTRAVENOUS | Status: DC

## 2023-12-01 NOTE — Progress Notes (Signed)
 Occupational Therapy Treatment Patient Details Name: Michael Mcknight MRN: [id number] DOB: [birth year] Today's Date: 12/01/2023   History of present illness Michael Mcknight is a 58 y.o. male who presented to the ED for evaluation of dizziness; Per sister, patient developed orthostatic hypotension with severe postural dizziness in August 2024, managed by PCP and Cardiologist; with medical history significant of A-fib/flutter on Eliquis , HTN, CKD stage IV, HFrecEF, OSA, orthostatic hypotension, dysautonomia, schizophrenia and autism spectrum disorder   OT comments  Pt with incr dizziness/orthostatic symptoms this session (see below for BP measures). Pt needing CGA for ADLs, mod I for bed mobility and CGA for transfers with 1 person HHA. Pt noted to have mild SOB and pt reports this is sometimes a symptom of dizziness, per sister also hand tremors and hearing difficulty are symptoms as well. Discussed sitting vs standing activities when experiencing dizziness with pt and family, pt verbalized understanding. Pt presenting with impairments listed below, will follow acutely. Patient will benefit from continued inpatient follow up therapy, <3 hours/day to maximize safety/ind with ADL/functional mobility.  BP supine 148/68 (90) BP seated 94/52 (61) BP standing 102/61 (75)       If plan is discharge home, recommend the following:  A little help with walking and/or transfers;A little help with bathing/dressing/bathroom;Assistance with cooking/housework;Direct supervision/assist for financial management;Direct supervision/assist for medications management;Assist for transportation   Equipment Recommendations  None recommended by OT    Recommendations for Other Services PT consult    Precautions / Restrictions Precautions Precautions: Fall Precaution Comments: hand tremors, difficulty hearing and SOB are signs of dizziness per sister Restrictions Weight Bearing Restrictions Per Provider Order: No        Mobility Bed Mobility Overal bed mobility: Modified Independent                  Transfers Overall transfer level: Needs assistance Equipment used: 2 person hand held assist, 1 person hand held assist Transfers: Sit to/from Stand Sit to Stand: Contact guard assist                 Balance     Sitting balance-Leahy Scale: Good       Standing balance-Leahy Scale: Fair                             ADL either performed or assessed with clinical judgement   ADL Overall ADL's : Needs assistance/impaired                     Lower Body Dressing: Contact guard assist   Toilet Transfer: Contact guard assist;Ambulation;Regular Toilet   Toileting- Clothing Manipulation and Hygiene: Contact guard assist       Functional mobility during ADLs: Contact guard assist      Extremity/Trunk Assessment Upper Extremity Assessment Upper Extremity Assessment: Generalized weakness   Lower Extremity Assessment Lower Extremity Assessment: Defer to PT evaluation        Vision   Vision Assessment?: No apparent visual deficits   Perception Perception Perception: Not tested   Praxis Praxis Praxis: Not tested    Cognition Arousal: Alert Behavior During Therapy: WFL for tasks assessed/performed, Anxious Overall Cognitive Status: History of cognitive impairments - at baseline                                 General Comments: does not always express when he  feels dizzy        Exercises      Shoulder Instructions       General Comments see note for BP measures    Pertinent Vitals/ Pain       Pain Assessment Pain Assessment: No/denies pain  Home Living                                          Prior Functioning/Environment              Frequency  Min 1X/week        Progress Toward Goals  OT Goals(current goals can now be found in the care plan section)  Progress towards OT goals:  Progressing toward goals  Acute Rehab OT Goals Patient Stated Goal: none stated OT Goal Formulation: With patient Time For Goal Achievement: 12/14/23 Potential to Achieve Goals: Good ADL Goals Pt Will Perform Upper Body Dressing: with modified independence;sitting Pt Will Perform Lower Body Dressing: with modified independence;sitting/lateral leans;sit to/from stand Pt Will Transfer to Toilet: with modified independence;ambulating;regular height toilet Pt Will Perform Tub/Shower Transfer: Tub transfer;Shower transfer;with modified independence;ambulating  Plan      Co-evaluation                 AM-PAC OT "6 Clicks" Daily Activity     Outcome Measure   Help from another person eating meals?: A Little Help from another person taking care of personal grooming?: A Little Help from another person toileting, which includes using toliet, bedpan, or urinal?: A Little Help from another person bathing (including washing, rinsing, drying)?: A Lot Help from another person to put on and taking off regular upper body clothing?: A Little Help from another person to put on and taking off regular lower body clothing?: A Little 6 Click Score: 17    End of Session Equipment Utilized During Treatment: Gait belt  OT Visit Diagnosis: Unsteadiness on feet (R26.81);Other abnormalities of gait and mobility (R26.89);Muscle weakness (generalized) (M62.81)   Activity Tolerance Patient tolerated treatment well   Patient Left in bed;with call bell/phone within reach;with bed alarm set;with family/visitor present   Nurse Communication Mobility status        Time: 1402-1430 OT Time Calculation (min): 28 min  Charges: OT General Charges $OT Visit: 1 Visit OT Treatments $Self Care/Home Management : 8-22 mins $Therapeutic Activity: 8-22 mins  Tamotsu Wiederholt K, OTD, OTR/L SecureChat Preferred Acute Rehab [phone]   Benedict Brain Koonce 12/01/2023, 3:27 PM

## 2023-12-01 NOTE — Telephone Encounter (Addendum)
 [facility] Acute Rehab dept. received a voicemail from the patient's sister [name]) who expressed the following:  *Michael Mcknight does not live in my home permanently. *My sister [name] and I will be at the hospital tomorrow 1/15 and need to have a conversation with his medical team about his occupational needs. *Concerned about what he is sharing with staff regarding his d/c set-up *Please call me back at [phone] [facility])

## 2023-12-01 NOTE — Progress Notes (Signed)
       RE:  Michael Mcknight   Date of Birth:  1966-01-13   Date:   12/01/2023     To Whom It May Concern:  Please be advised that the above-named patient will require a short-term nursing home stay - anticipated 30 days or less for rehabilitation and strengthening.  The plan is for return home.

## 2023-12-01 NOTE — TOC Progression Note (Signed)
 Transition of Care [facility]) - Progression Note    Patient Details  Name: Michael Mcknight MRN: [id number] Date of Birth: 01-Oct-1966  Transition of Care [facility]) CM/SW Contact  Tom-Johnson, Seydou Hearns Daphne, RN Phone Number: 12/01/2023, 1:44 PM  Clinical Narrative:     PT updated discharge disposition recommendation to SNF. Patient and sister, Estel Heir aware and LCSW following for placement.   CM will sign off t this time.      Expected Discharge Plan and Services                                               Social Determinants of Health (SDOH) Interventions SDOH Screenings   Food Insecurity: No Food Insecurity (11/30/2023)  Housing: Low Risk  (11/30/2023)  Transportation Needs: No Transportation Needs (11/30/2023)  Utilities: Not At Risk (11/30/2023)  Tobacco Use: Low Risk  (11/15/2023)    Readmission Risk Interventions    11/30/2023    5:09 PM  Readmission Risk Prevention Plan  Post Dischage Appt Complete  Medication Screening Complete  Transportation Screening Complete

## 2023-12-01 NOTE — Progress Notes (Signed)
   12/01/23 1411  Spiritual Encounters  Type of Visit Initial  Care provided to: Pt and family  Referral source Nurse (RN/NT/LPN)  Reason for visit Advance directives  OnCall Visit No  Advance Directives (For Healthcare)  Does Patient Have a Medical Advance Directive? No    Chaplains Doylene Genet and Zoila Hines responded to consult for AD information. Upon assessment, it was determined that pt would be unable to make healthcare decisions without assistance from others. He verbally expressed this. His sister and brother-in-law were present, and therefore we did not proceed with the AD. Provided education to pt's sister, which she understood, that we would not be able to proceed with AD. She was grateful for the explanation.

## 2023-12-01 NOTE — Progress Notes (Signed)
 Physical Therapy Treatment Patient Details Name: Michael Mcknight MRN: [id number] DOB: 02-25-1966 Today's Date: 12/01/2023   History of Present Illness Michael Mcknight is a 58 y.o. male who presented to the ED for evaluation of dizziness; Per sister, patient developed orthostatic hypotension with severe postural dizziness in August 2024, managed by PCP and Cardiologist; with medical history significant of A-fib/flutter on Eliquis , HTN, CKD stage IV, HFrecEF, OSA, orthostatic hypotension, dysautonomia, schizophrenia and autism spectrum disorder    PT Comments  Continuing work on functional mobility and activity tolerance;  Pt's sister, Michael Mcknight, and brother-in-law present for session, and voiced concerns re: Michael Mcknight's safety at home; Reports difficulty meeting his supervision needs for safety; Addressed Darlene's questions re: some characteristics of orthostatic hypotension as well;   Session focused on functional transfers and amb, with careful monitor of syncopal, presyncopal symptoms; Noteworthy that Michael Mcknight used the terms "dizzy" and "shaky" to describe how he felt when standing and his BP decreased; he is also observably more anxious, and less able to listen to directions; Educated Michael Mcknight and his family re: sitting down for safety when these sensations occur; Michael Mcknight was able to walk with the rollator in his room and a small bit into the hallway -- required near constant supervision and close guard for safety; Performed this session with ted hose, and performed physical exercises like heel raises and standing marching to see if that helped to hold off BP drop -- will need more practice and investigation;   At this point, it is worth considering post-acute rehab at SNF level to continue work on activity tolerance, educate to self-monitor, and to perform safe measures to take when dizzy; I also agree that pt and family need to start looking into longer-term more sustainable living, like assisted living   If  plan is discharge home, recommend the following: Assist for transportation;Help with stairs or ramp for entrance   Can travel by private vehicle     Yes (wheelchair to car)  Equipment Recommendations  Other (comment) (tbd at next venue)    Recommendations for Other Services       Precautions / Restrictions Precautions Precautions: Fall Precaution Comments: Watch for signs of syncope/dizziness Restrictions Weight Bearing Restrictions Per Provider Order: No     Mobility  Bed Mobility Overal bed mobility: Modified Independent                  Transfers Overall transfer level: Needs assistance Equipment used: Rollator (4 wheels) Transfers: Sit to/from Stand Sit to Stand: Supervision, Contact guard assist           General transfer comment: Cues for safety, hand placement,  and brakes with Rollaotr    Ambulation/Gait Ambulation/Gait assistance: Contact guard assist, Min assist Gait Distance (Feet): 28 Feet Assistive device: Rollator (4 wheels) Gait Pattern/deviations: Step-through pattern       General Gait Details: Very close guard, with and without physical contact; Near constant cues for self-monitor for activity tolerance; occasional need for physical assist with rollator management   Stairs             Wheelchair Mobility     Tilt Bed    Modified Rankin (Stroke Patients Only)       Balance     Sitting balance-Leahy Scale: Good       Standing balance-Leahy Scale: Fair  Cognition Arousal: Alert Behavior During Therapy: WFL for tasks assessed/performed, Anxious Overall Cognitive Status: History of cognitive impairments - at baseline                                 General Comments: incr anxiety with BP drops in standing        Exercises      General Comments General comments (skin integrity, edema, etc.):   12/01/23 1100 12/01/23 1105 12/01/23 1110  Orthostatic Lying    BP- Lying 143/71  --   --   Pulse- Lying 85  --   --   Orthostatic Sitting  BP- Sitting 119/78 120/68 (!) 70/54 (sitting in Rollator RW after amb bout)  Pulse- Sitting 83 93 98  Orthostatic Standing at 0 minutes  BP- Standing at 0 minutes 101/66  (Performed march in place, and heel raises)  --   Pulse- Standing at 0 minutes 105 (Dizzy)  (Too dizzy to maintain standing long endough for BP to read)  --   Orthostatic Standing at 3 minutes  Pulse- Standing at 3 minutes  (Too dizzy to stand 3 minutes)  --   --     12/01/23 1115 12/01/23 1116  Orthostatic Lying   BP- Lying  --  130/71  Pulse- Lying  --  82  Orthostatic Sitting  BP- Sitting 118/46  --   Pulse- Sitting 86  --   Orthostatic Standing at 0 minutes  BP- Standing at 0 minutes  --   --   Pulse- Standing at 0 minutes  --   --   Orthostatic Standing at 3 minutes  Pulse- Standing at 3 minutes  --   --          Pertinent Vitals/Pain Pain Assessment Pain Assessment: No/denies pain    Home Living                          Prior Function            PT Goals (current goals can now be found in the care plan section) Acute Rehab PT Goals Patient Stated Goal: Does not want to fall again PT Goal Formulation: With patient Time For Goal Achievement: 12/14/23 Potential to Achieve Goals: Good Progress towards PT goals: Progressing toward goals    Frequency    Min 1X/week      PT Plan      Co-evaluation              AM-PAC PT "6 Clicks" Mobility   Outcome Measure  Help needed turning from your back to your side while in a flat bed without using bedrails?: None Help needed moving from lying on your back to sitting on the side of a flat bed without using bedrails?: None Help needed moving to and from a bed to a chair (including a wheelchair)?: A Little Help needed standing up from a chair using your arms (e.g., wheelchair or bedside chair)?: A Lot Help needed to walk in hospital room?: A  Lot Help needed climbing 3-5 steps with a railing? : Total 6 Click Score: 16    End of Session   Activity Tolerance: Other (comment);Treatment limited secondary to medical complications (Comment) (Orthostatic hypotension present, and limited amb distance) Patient left: in chair;with call bell/phone within reach;with chair alarm set Nurse Communication: Mobility status PT Visit Diagnosis: Other abnormalities of gait and mobility (R26.89);Dizziness and  giddiness (R42)     Time: 1610-9604 PT Time Calculation (min) (ACUTE ONLY): 31 min  Charges:    $Gait Training: 8-22 mins $Therapeutic Activity: 8-22 mins PT General Charges $$ ACUTE PT VISIT: 1 Visit                     Darcus Eastern, PT  Acute Rehabilitation Services Office [phone] Secure Chat welcomed    Marcial Setting 12/01/2023, 12:51 PM

## 2023-12-01 NOTE — Progress Notes (Addendum)
 PROGRESS NOTE    Michael Mcknight  UJW:[id number] DOB: 05-Nov-1966 DOA: 11/29/2023 PCP: Michael Jes, FNP    Brief Narrative:   Michael Mcknight is a 58 y.o. male with past medical history significant for A-fib/flutter on Eliquis , HTN, CKD stage IV, chronic systolic congestive heart failure, orthostatic hypotension with dysautonomia, schizophrenia, autism spectrum disorder, OSA who presented to [facility] ED on 1/13 via EMS for complaints of chest pain, dizziness. Per sister, patient developed orthostatic hypotension with severe postural dizziness in August 2024. This is being managed by his PCP and cardiology. Patient continues to be dizzy even with changes to his medications and has had multiple falls secondary to the dizziness. This morning, patient was scheduled to get MRI (unsure what type of MRI) ordered by his PCP. They sat patient in the chair and when he tried to get up, he started having significant dizziness, waving his hands in the air so they called EMS. Patient reports that he has been having dizzy spells almost every day mostly when he stands up or sometimes even while laying down. He describes the spells as lightheadedness however according to sister he does have episodes that looks like the room spinning. He denies any chest pain, shortness of breath, palpitations, headaches or vision changes.   On EMS arrival, patient reported some chest discomfort so he was given ASA 325 mg x 1. He was found to be orthostatic with drop in his SBP to the 90s upon standing.  12-lead was unremarkable.  In the ED, temperature 98.2 F, HR 66, RR 17, BP 136/88, SpO2 100% on room air.  Orthostatic vital signs positive in the ED with SBP dropping from 147 to 95.  WBC 11.8, hemoglobin 10.0, platelet count 628.  Sodium 135, potassium 4.2, chloride 105, CO2 16, glucose 106, BUN 34, creatinine 2.22.  BNP 64.5.  High sensitive troponin 8> 8.  Chest x-ray with no acute cardiopulmonary process.  FOBT negative.  TSH  1.372.  Random cortisol 16.6.  VBG with pH 7.46, pCO2 22.2, PaO2 64, bicarb 16.7.  MRI brain with no acute finding, moderate spinal canal stenosis C 3-4 level.  EKG with sinus rhythm, PACs and RBBB.  Patient unable to ambulate due to his severe postural dizziness.  TRH consulted for admission.  Assessment & Plan:   Orthostatic hypotension with associated dysautonomia Patient with history of persistent orthostatic hypotension here for evaluation after had a severe postural dizziness this morning prior to going for an MRI. He is significantly orthostatic positive on admission with difficulty getting up due to the dizziness. SBP dropped from 147 lying down to 100 upon standing then 108 upon standing for 3. On chart review, during patient's ER visit at [facility] in November, a CT head showed "Soft tissue attenuation adjacent in the bilateral Prussak space, right greater than left, which could represent cholesteatomas. This can be an etiology of dizziness. Further evaluation with nonemergent/outpatient MRI is recommended." He was seen by his cardiology in November and was started on Florinef . He followed up with cardiology 2 weeks ago due to persistent postural dizziness and his midodrine  was increased to 10 mg with plan to increase his droxidopa  to 200 mg if symptoms do not improve.  MR brain without contrast with no acute findings.  Cortisol level and TSH within normal limits. -- Midodrine  10 mg PO TID -- Droxidopa  100 mg PO TID -- Florinef  increased from 0.1 to 0.2 mg PO daily -- 1 L NS bolus followed by NS at  75 mL/h -- Ordered thigh-high TED hose -- Repeat orthostatic vital signs in the morning -- Fall precautions  Hypokalemia Potassium 3.4, will replete. -- Repeat electrolytes in a.m. to include magnesium  Chronic diastolic congestive heart failure Hx HFrecHF Patient with previous decreased LV function with a EF 30-35% November 2020, now increased to 55-6% on TTE October 2023.  Repeat  TTE this hospitalization with LVEF 55-60%, no LV regional wall motion maladies, LV diastolic parameters indeterminate, small pericardial effusion, no aortic stenosis, IVC normal in size.  History of paroxysmal atrial fibrillation/flutter History of right bundle branch block G notable for sinus rhythm, with PACs and RBBB.  -- Eliquis  5 mg PO BID  Type 2 diabetes mellitus Patient was recently discontinued off of insulin  after losing significant amount of weight.  Hemoglobin A1c -- SSI for coverage -- CBGs qAC/HS  CKD stage IV Creatinine 2.22 on admission, recently 2.14; three weeks prior. -- Cr 2.22>2.30>2.01 -- Avoid nephrotoxins, renal dose all medications -- BMP daily  Anxiety/depression: -- Lexapro  10 mg p.o. daily  Schizophrenia Patient currently on  risperidone  and trihexyphenidyl . Per sister, he has been on these medications for years and they preceded his orthostatic hypotension.  This meds are known causes of orthostatic hypotension. If the etiology of his orthostatic hypotension are not found, he will likely need to be weaned off slowly in the outpatient. -- Continue on risperidone  and  trihexyphenidyl  for now -- Outpatient follow-up with behavioral health  OSA -- Continue nocturnal CPAP  Weakness/debility/deconditioning: -- PT now recommending SNF placement --Repeat OT evaluation: Pending -- Continue therapy efforts while inpatient -- TOC consulted   DVT prophylaxis: Place TED hose Start: 12/01/23 0818 apixaban  (ELIQUIS ) tablet 5 mg    Code Status: Full Code Family Communication: Updated family present at bedside this morning  Disposition Plan:  Level of care: Telemetry Medical Status is: Inpatient Remains inpatient appropriate because: Will need SNF placement, TOC consulted    Consultants:  None  Procedures:  TTE  Antimicrobials:  None   Subjective: Patient seen examined bedside, resting calmly.  Lying in bed.  Family present at bedside.  Concerned  about his return home with continued orthostasis and unable to manage his care.  Seen by PT once again this morning, thigh-high TED hose placed and changing recommendations to SNF.  Awaiting OT reevaluation.  Patient with no significant complaints this morning, currently denies any dizziness at rest.  Denies headache, no chest pain, no palpitations, no shortness of breath, no abdominal pain, no fever, no nausea/vomiting/diarrhea, no paresthesias.  No acute events overnight per nursing staff.  Objective: Vitals:   11/30/23 1742 11/30/23 2020 12/01/23 0452 12/01/23 0801  BP: (!) 152/74 (!) 163/78 132/71 129/67  Pulse: (!) 58 (!) 55 70 66  Resp: 18  18 18   Temp: 97.9 F (36.6 C) 97.7 F (36.5 C) 98.2 F (36.8 C) (!) 97.4 F (36.3 C)  TempSrc:  Oral Oral Oral  SpO2: 100% 100% 100% 100%  Weight:      Height:        Intake/Output Summary (Last 24 hours) at 12/01/2023 1256 Last data filed at 12/01/2023 8657 Gross per 24 hour  Intake 721.25 ml  Output 1250 ml  Net -528.75 ml   Filed Weights   11/29/23 0832 11/29/23 1716  Weight: 84 kg 84.2 kg    Examination:  Physical Exam: GEN: NAD, alert HEENT: NCAT, PERRL, EOMI, sclera clear, MMM PULM: CTAB w/o wheezes/crackles, normal respiratory effort, on room air CV: RRR w/o M/G/R  GI: abd soft, NTND, NABS, no R/G/M MSK: no peripheral edema, moves all extremities independently NEURO: No focal neurological deficits PSYCH: normal mood/affect Integumentary: No concerning rashes/lesions/wounds noted on exposed skin surface    Data Reviewed: I have personally reviewed following labs and imaging studies  CBC: Recent Labs  Lab 11/29/23 0951 11/29/23 1041 11/30/23 0410 12/01/23 0431  WBC 11.8*  --  12.1* 11.2*  HGB 10.0* 9.5* 9.4* 9.5*  HCT 30.5* 28.0* 28.6* 28.5*  MCV 85.4  --  86.1 84.8  PLT 628*  --  600* 569*   Basic Metabolic Panel: Recent Labs  Lab 11/29/23 0852 11/29/23 1041 11/29/23 1335 11/30/23 0410 12/01/23 0431  NA  135 139  --  137 138  K 4.2 3.8  --  4.1 3.4*  CL 105  --   --  110 113*  CO2 16*  --   --  19* 17*  GLUCOSE 106*  --   --  108* 98  BUN 34*  --   --  32* 32*  CREATININE 2.22*  --   --  2.30* 2.01*  CALCIUM 10.2  --   --  9.5 9.4  MG  --   --  2.1  --   --   PHOS  --   --  3.4  --   --    GFR: Estimated Creatinine Clearance: 41.8 mL/min (A) (by C-G formula based on SCr of 2.01 mg/dL (H)). Liver Function Tests: No results for input(s): "AST", "ALT", "ALKPHOS", "BILITOT", "PROT", "ALBUMIN " in the last 168 hours. No results for input(s): "LIPASE", "AMYLASE" in the last 168 hours. No results for input(s): "AMMONIA" in the last 168 hours. Coagulation Profile: No results for input(s): "INR", "PROTIME" in the last 168 hours. Cardiac Enzymes: No results for input(s): "CKTOTAL", "CKMB", "CKMBINDEX", "TROPONINI" in the last 168 hours. BNP (last 3 results) No results for input(s): "PROBNP" in the last 8760 hours. HbA1C: Recent Labs    12/01/23 0431  HGBA1C 8.1*   CBG: Recent Labs  Lab 11/30/23 1158 11/30/23 1642 11/30/23 2020 12/01/23 0758 12/01/23 1223  GLUCAP 83 108* 99 88 105*   Lipid Profile: No results for input(s): "CHOL", "HDL", "LDLCALC", "TRIG", "CHOLHDL", "LDLDIRECT" in the last 72 hours. Thyroid  Function Tests: Recent Labs    11/29/23 1335  TSH 1.372   Anemia Panel: Recent Labs    11/29/23 1335  VITAMINB12 290   Sepsis Labs: No results for input(s): "PROCALCITON", "LATICACIDVEN" in the last 168 hours.  No results found for this or any previous visit (from the past 240 hours).       Radiology Studies: ECHOCARDIOGRAM COMPLETE Result Date: 11/30/2023    ECHOCARDIOGRAM REPORT   [name] Hebert Date of Exam: 11/30/2023 Medical Rec #:  [id number]        Height:       66.0 in Accession #:    [id number]       Weight:       185.6 lb Date of Birth:  Aug 31, 1966        BSA:          1.937 m Patient Age:    57 years         BP:           111/84 mmHg  Patient Gender: M                HR:           90 bpm. Exam Location:  Inpatient Procedure: 2D Echo, Cardiac Doppler and Color Doppler Indications:    Dyspna, A-fib  History:        Patient has prior history of Echocardiogram examinations, most                 recent 08/27/2022. Cardiomegaly and Cardiomyopathy;                 Arrythmias:Atrial Fibrillation and Atrial Flutter.  Sonographer:    Aldon Ambrosia Referring Phys: [id number] PROSPER M AMPONSAH IMPRESSIONS  1. Left ventricular ejection fraction, by estimation, is 55 to 60%. The left ventricle has normal function. The left ventricle has no regional wall motion abnormalities. Left ventricular diastolic parameters are indeterminate.  2. Right ventricular systolic function is normal. The right ventricular size is normal. Tricuspid regurgitation signal is inadequate for assessing PA pressure.  3. A small pericardial effusion is present. The pericardial effusion is posterior to the left ventricle.  4. The mitral valve is grossly normal. Trivial mitral valve regurgitation. No evidence of mitral stenosis.  5. The aortic valve was not well visualized. Aortic valve regurgitation is not visualized. No aortic stenosis is present.  6. The inferior vena cava is normal in size with greater than 50% respiratory variability, suggesting right atrial pressure of 3 mmHg. Comparison(s): No significant change from prior study. Prior images reviewed side by side. FINDINGS  Left Ventricle: Left ventricular ejection fraction, by estimation, is 55 to 60%. The left ventricle has normal function. The left ventricle has no regional wall motion abnormalities. The left ventricular internal cavity size was normal in size. There is  no left ventricular hypertrophy. Left ventricular diastolic parameters are indeterminate. Right Ventricle: The right ventricular size is normal. No increase in right ventricular wall thickness. Right ventricular systolic function is normal. Tricuspid regurgitation  signal is inadequate for assessing PA pressure. Left Atrium: Left atrial size was normal in size. Right Atrium: Right atrial size was normal in size. Pericardium: A small pericardial effusion is present. The pericardial effusion is posterior to the left ventricle. Presence of epicardial fat layer. Mitral Valve: The mitral valve is grossly normal. Trivial mitral valve regurgitation. No evidence of mitral valve stenosis. MV peak gradient, 1.1 mmHg. The mean mitral valve gradient is 1.0 mmHg. Tricuspid Valve: The tricuspid valve is normal in structure. Tricuspid valve regurgitation is not demonstrated. No evidence of tricuspid stenosis. Aortic Valve: The aortic valve was not well visualized. Aortic valve regurgitation is not visualized. No aortic stenosis is present. Aortic valve mean gradient measures 2.3 mmHg. Aortic valve peak gradient measures 3.7 mmHg. Aortic valve area, by VTI measures 3.28 cm. Pulmonic Valve: The pulmonic valve was normal in structure. Pulmonic valve regurgitation is not visualized. No evidence of pulmonic stenosis. Aorta: The aortic root and ascending aorta are structurally normal, with no evidence of dilitation. Venous: The inferior vena cava is normal in size with greater than 50% respiratory variability, suggesting right atrial pressure of 3 mmHg. IAS/Shunts: The atrial septum is grossly normal.  LEFT VENTRICLE PLAX 2D LVIDd:         5.40 cm      Diastology LVIDs:         3.20 cm      LV e' medial:    5.33 cm/s LV PW:         0.80 cm      LV E/e' medial:  10.8 LV IVS:        0.70 cm      LV e' lateral:  5.98 cm/s LVOT diam:     2.00 cm      LV E/e' lateral: 9.6 LV SV:         50 LV SV Index:   26 LVOT Area:     3.14 cm  LV Volumes (MOD) LV vol d, MOD A2C: 108.0 ml LV vol d, MOD A4C: 145.0 ml LV vol s, MOD A2C: 52.3 ml LV vol s, MOD A4C: 59.6 ml LV SV MOD A2C:     55.7 ml LV SV MOD A4C:     145.0 ml LV SV MOD BP:      71.0 ml RIGHT VENTRICLE             IVC RV S prime:     15.80 cm/s  IVC  diam: 1.00 cm TAPSE (M-mode): 2.0 cm LEFT ATRIUM           Index LA diam:      3.00 cm 1.55 cm/m LA Vol (A2C): 32.3 ml 16.67 ml/m LA Vol (A4C): 42.3 ml 21.83 ml/m  AORTIC VALVE                    PULMONIC VALVE AV Area (Vmax):    3.30 cm     PV Vmax:       0.78 m/s AV Area (Vmean):   2.95 cm     PV Peak grad:  2.4 mmHg AV Area (VTI):     3.28 cm AV Vmax:           96.07 cm/s AV Vmean:          68.500 cm/s AV VTI:            0.153 m AV Peak Grad:      3.7 mmHg AV Mean Grad:      2.3 mmHg LVOT Vmax:         101.00 cm/s LVOT Vmean:        64.400 cm/s LVOT VTI:          0.160 m LVOT/AV VTI ratio: 1.04  AORTA Ao Root diam: 3.30 cm Ao Asc diam:  3.20 cm MITRAL VALVE MV Area (PHT): 3.03 cm    SHUNTS MV Area VTI:   4.65 cm    Systemic VTI:  0.16 m MV Peak grad:  1.1 mmHg    Systemic Diam: 2.00 cm MV Mean grad:  1.0 mmHg MV Vmax:       0.52 m/s MV Vmean:      34.6 cm/s MV Decel Time: 250 msec MV E velocity: 57.30 cm/s MV A velocity: 70.80 cm/s MV E/A ratio:  0.81 Gloriann Larger MD Electronically signed by Gloriann Larger MD Signature Date/Time: 11/30/2023/11:04:24 AM    Final    MR BRAIN W WO CONTRAST Result Date: 11/30/2023 CLINICAL DATA:  Dizziness EXAM: MRI HEAD WITHOUT AND WITH CONTRAST TECHNIQUE: Multiplanar, multiecho pulse sequences of the brain and surrounding structures were obtained without and with intravenous contrast. CONTRAST:  8mL GADAVIST  GADOBUTROL  1 MMOL/ML IV SOLN COMPARISON:  None Available. FINDINGS: Brain: No acute infarct, mass effect or extra-axial collection. No acute or chronic hemorrhage. Normal white matter signal, parenchymal volume and CSF spaces. The midline structures are normal. There is no abnormal contrast enhancement. Vascular: Normal flow voids. Skull and upper cervical spine: Moderate spinal canal stenosis at the C3-4 level. Sinuses/Orbits:No paranasal sinus fluid levels or advanced mucosal thickening. No mastoid or middle ear effusion. Normal orbits. IMPRESSION: 1.  Normal MRI of the brain. 2. Moderate spinal  canal stenosis at the C3-4 level. Electronically Signed   By: Juanetta Nordmann M.D.   On: 11/30/2023 01:56        Scheduled Meds:  apixaban   5 mg Oral BID   droxidopa   100 mg Oral TID WC   escitalopram   10 mg Oral Daily   feeding supplement  237 mL Oral BID BM   fludrocortisone   0.2 mg Oral Daily   insulin  aspart  0-15 Units Subcutaneous TID WC   midodrine   10 mg Oral TID WC   risperidone   4 mg Oral QHS   trihexyphenidyl   2 mg Oral BID   Continuous Infusions:  sodium chloride  75 mL/hr at 12/01/23 0337     LOS: 1 day    Time spent: 52 minutes spent on chart review, discussion with nursing staff, consultants, updating family and interview/physical exam; more than 50% of that time was spent in counseling and/or coordination of care.    Rema Care [place], DO [facility] Available via Epic secure chat 7am-7pm After these hours, please refer to coverage provider listed on [email or web address] 12/01/2023, 12:56 PM

## 2023-12-01 NOTE — NC FL2 (Signed)
 [place]  MEDICAID FL2 LEVEL OF CARE FORM     IDENTIFICATION  Patient Name: Michael Mcknight Birthdate: 05-23-1966 Sex: male Admission Date (Current Location): 11/29/2023  [facility] and [place] Number:  Producer, television/film/video and Address:  [facility] [place], [address], [place], [place] [address]      Provider Number: [id number]  Attending Physician Name and Address:  [place], Eric J, DO  Relative Name and Phone Number:  [name] (Sister)  [phone] [facility])    Current Level of Care: Hospital Recommended Level of Care: Skilled Nursing Facility Prior Approval Number:    Date Approved/Denied:   PASRR Number: Pending  Discharge Plan: SNF    Current Diagnoses: Patient Active Problem List   Diagnosis Date Noted   Orthostatic hypotension dysautonomic syndrome 11/30/2023   Postural dizziness with presyncope 11/29/2023   Acute kidney injury superimposed on CKD (HCC) 09/20/2021   Orthostatic hypotension 09/20/2021   Near syncope 09/20/2021   AF (paroxysmal atrial fibrillation) (HCC) 09/20/2021   Prolonged QT interval 09/20/2021   AKI (acute kidney injury) (HCC) 09/20/2021   Cyst of left kidney 10/04/2020   Cardiorenal syndrome with renal failure 06/26/2020   Chronic systolic congestive heart failure (HCC) 06/26/2020   Obesity (BMI 30-39.9) 06/26/2020   CKD (chronic kidney disease) stage 4, GFR 15-29 ml/min (HCC) 06/26/2020   Dilated cardiomyopathy (HCC) ejection fraction 35% in November 2020 10/23/2019   Congestive heart failure due to cardiomyopathy (HCC) [place]  Heart Association class II 10/23/2019   Atypical atrial flutter (HCC)    CKD (chronic kidney disease), stage III (HCC) 09/22/2019   Schizophrenia (HCC) 09/22/2019   Cardiomegaly 09/22/2019   Pleural effusion 09/22/2019   Lobar pneumonia (HCC) 09/22/2019   Benign essential HTN 09/22/2019   Atrial fibrillation/flutter (HCC) 09/22/2019    Orientation RESPIRATION BLADDER Height &  Weight     Self, Situation, Place, Time  Normal Continent Weight: 185 lb 10 oz (84.2 kg) Height:  5' 5.98" (167.6 cm)  BEHAVIORAL SYMPTOMS/MOOD NEUROLOGICAL BOWEL NUTRITION STATUS      Continent Diet (see d/c summary)  AMBULATORY STATUS COMMUNICATION OF NEEDS Skin   Extensive Assist Verbally Normal                       Personal Care Assistance Level of Assistance  Bathing, Dressing, Feeding Bathing Assistance: Limited assistance Feeding assistance: Independent Dressing Assistance: Limited assistance     Functional Limitations Info  Sight, Speech, Hearing Sight Info: Adequate Hearing Info: Adequate Speech Info: Adequate    SPECIAL CARE FACTORS FREQUENCY  OT (By licensed OT), PT (By licensed PT)     PT Frequency: 5x/week OT Frequency: 5x/week            Contractures Contractures Info: Not present    Additional Factors Info  Code Status, Allergies Code Status Info: Full code Allergies Info: Codeine           Current Medications (12/01/2023):  This is the current hospital active medication list Current Facility-Administered Medications  Medication Dose Route Frequency Provider Last Rate Last Admin   0.9 %  sodium chloride  infusion   Intravenous Continuous [place], Eric J, DO 75 mL/hr at 12/01/23 0337 New Bag at 12/01/23 5621   acetaminophen  (TYLENOL ) tablet 650 mg  650 mg Oral Q6H PRN Amponsah, Prosper M, MD       Or   acetaminophen  (TYLENOL ) suppository 650 mg  650 mg Rectal Q6H PRN Vita Grip, MD  apixaban  (ELIQUIS ) tablet 5 mg  5 mg Oral BID Amponsah, Prosper M, MD   5 mg at 12/01/23 0920   droxidopa  (NORTHERA ) capsule 200 mg  200 mg Oral TID WC [place], Eric J, DO       escitalopram  (LEXAPRO ) tablet 10 mg  10 mg Oral Daily Amponsah, Prosper M, MD   10 mg at 12/01/23 0920   feeding supplement (ENSURE ENLIVE / ENSURE PLUS) liquid 237 mL  237 mL Oral BID BM [place], Eric J, DO   237 mL at 12/01/23 2841   fludrocortisone  (FLORINEF ) tablet 0.2 mg   0.2 mg Oral Daily Amponsah, Prosper M, MD   0.2 mg at 12/01/23 3244   insulin  aspart (novoLOG ) injection 0-15 Units  0-15 Units Subcutaneous TID WC Vita Grip, MD       midodrine  (PROAMATINE ) tablet 10 mg  10 mg Oral TID WC Amponsah, Prosper M, MD   10 mg at 12/01/23 1224   ondansetron  (ZOFRAN ) tablet 4 mg  4 mg Oral Q6H PRN Vita Grip, MD       Or   ondansetron  (ZOFRAN ) injection 4 mg  4 mg Intravenous Q6H PRN Amponsah, Prosper M, MD       potassium chloride  SA (KLOR-CON  M) CR tablet 40 mEq  40 mEq Oral Once [place], Eric J, DO       risperiDONE  (RISPERDAL ) tablet 4 mg  4 mg Oral QHS Amponsah, Prosper M, MD   4 mg at 11/30/23 2200   senna-docusate (Senokot-S) tablet 1 tablet  1 tablet Oral QHS PRN Vita Grip, MD       trihexyphenidyl  (ARTANE ) tablet 2 mg  2 mg Oral BID Amponsah, Prosper M, MD   2 mg at 12/01/23 0102     Discharge Medications: Please see discharge summary for a list of discharge medications.  Relevant Imaging Results:  Relevant Lab Results:   Additional Information SSN [id number] [place], [place]

## 2023-12-01 NOTE — TOC Progression Note (Signed)
 Transition of Care [facility]) - Progression Note    Patient Details  Name: ARVIND SEAY MRN: [id number] Date of Birth: 1966/06/26  [facility] CM/SW Contact  Katrinka Parr, [place] Phone Number: 12/01/2023, 2:46 PM  Clinical Narrative:     CSW met with pt and sister bedside to discuss disposition. PT rec updated to SNF. Pt and sister confirm desire for SNF and are agreeable to SNF referrals in [place], [place], [place] areas. [facility] are possibly the closest for them but they are open to other areas. CSW completed fl2 and faxed bed requests in hub. PASRR pending(docs have been uploaded to [facility]).   Expected Discharge Plan: Skilled Nursing Facility Barriers to Discharge: No Barriers Identified  Expected Discharge Plan and Services       Living arrangements for the past 2 months: Single Family Home                                       Social Determinants of Health (SDOH) Interventions SDOH Screenings   Food Insecurity: No Food Insecurity (11/30/2023)  Housing: Low Risk  (11/30/2023)  Transportation Needs: No Transportation Needs (11/30/2023)  Utilities: Not At Risk (11/30/2023)  Tobacco Use: Low Risk  (11/15/2023)    Readmission Risk Interventions    11/30/2023    5:09 PM  Readmission Risk Prevention Plan  Post Dischage Appt Complete  Medication Screening Complete  Transportation Screening Complete

## 2023-12-02 DIAGNOSIS — I951 Orthostatic hypotension: Secondary | ICD-10-CM | POA: Diagnosis not present

## 2023-12-02 LAB — BASIC METABOLIC PANEL
Anion gap: 7 (ref 5–15)
BUN: 24 mg/dL — ABNORMAL HIGH (ref 6–20)
CO2: 18 mmol/L — ABNORMAL LOW (ref 22–32)
Calcium: 9.4 mg/dL (ref 8.9–10.3)
Chloride: 112 mmol/L — ABNORMAL HIGH (ref 98–111)
Creatinine, Ser: 1.67 mg/dL — ABNORMAL HIGH (ref 0.61–1.24)
GFR, Estimated: 48 mL/min — ABNORMAL LOW (ref >60)
Glucose, Bld: 120 mg/dL — ABNORMAL HIGH (ref 70–99)
Potassium: 4.3 mmol/L (ref 3.5–5.1)
Sodium: 137 mmol/L (ref 135–145)

## 2023-12-02 LAB — GLUCOSE, CAPILLARY
Glucose-Capillary: 93 mg/dL (ref 70–99)
Glucose-Capillary: 83 mg/dL (ref 70–99)
Glucose-Capillary: 87 mg/dL (ref 70–99)
Glucose-Capillary: 111 mg/dL — ABNORMAL HIGH (ref 70–99)

## 2023-12-02 LAB — MAGNESIUM: Magnesium: 2 mg/dL (ref 1.7–2.4)

## 2023-12-02 NOTE — Progress Notes (Signed)
Mobility Specialist Progress Note:    12/02/23 1000  Orthostatic Sitting  BP- Sitting 120/79  Orthostatic Standing at 0 minutes  BP- Standing at 0 minutes 111/76  Orthostatic Standing at 3 minutes  BP- Standing at 3 minutes 105/60  Mobility  Activity Ambulated with assistance in room  Level of Assistance Contact guard assist, steadying assist  Assistive Device Other (Comment) (IV Pole)  Distance Ambulated (ft) 40 ft  Activity Response Tolerated well  Mobility Referral Yes  Mobility visit 1 Mobility  Mobility Specialist Start Time (ACUTE ONLY) [id number]  Mobility Specialist Stop Time (ACUTE ONLY) 0959  Mobility Specialist Time Calculation (min) (ACUTE ONLY) 11 min   Pt received in chair and agreeable. Orthostatic vitals taken, see above. Pt asymptomatic throughout w/ no complaints. Left in chair with call bell and all needs met. Chair alarm on and RN aware.  D'Vante Earlene Plater Mobility Specialist Please contact via Special educational needs teacher or Rehab office at [phone]

## 2023-12-02 NOTE — Progress Notes (Signed)
PROGRESS NOTE    Michael Mcknight  VFI:[id number] DOB: [birth year] DOA: 11/29/2023 PCP: Ellis Parents, FNP    Brief Narrative:   Michael Mcknight is a 58 y.o. male with past medical history significant for A-fib/flutter on Eliquis, HTN, CKD stage IV, chronic systolic congestive heart failure, orthostatic hypotension with dysautonomia, schizophrenia, autism spectrum disorder, OSA who presented to [facility] ED on 1/13 via EMS for complaints of chest pain, dizziness. Per sister, patient developed orthostatic hypotension with severe postural dizziness in August 2024. This is being managed by his PCP and cardiology. Patient continues to be dizzy even with changes to his medications and has had multiple falls secondary to the dizziness. This morning, patient was scheduled to get MRI (unsure what type of MRI) ordered by his PCP. They sat patient in the chair and when he tried to get up, he started having significant dizziness, waving his hands in the air so they called EMS. Patient reports that he has been having dizzy spells almost every day mostly when he stands up or sometimes even while laying down. He describes the spells as lightheadedness however according to sister he does have episodes that looks like the room spinning. He denies any chest pain, shortness of breath, palpitations, headaches or vision changes.   On EMS arrival, patient reported some chest discomfort so he was given ASA 325 mg x 1. He was found to be orthostatic with drop in his SBP to the 90s upon standing.  12-lead was unremarkable.  In the ED, temperature 98.2 F, HR 66, RR 17, BP 136/88, SpO2 100% on room air.  Orthostatic vital signs positive in the ED with SBP dropping from 147 to 95.  WBC 11.8, hemoglobin 10.0, platelet count 628.  Sodium 135, potassium 4.2, chloride 105, CO2 16, glucose 106, BUN 34, creatinine 2.22.  BNP 64.5.  High sensitive troponin 8> 8.  Chest x-ray with no acute cardiopulmonary process.  FOBT negative.  TSH  1.372.  Random cortisol 16.6.  VBG with pH 7.46, pCO2 22.2, PaO2 64, bicarb 16.7.  MRI brain with no acute finding, moderate spinal canal stenosis C 3-4 level.  EKG with sinus rhythm, PACs and RBBB.  Patient unable to ambulate due to his severe postural dizziness.  TRH consulted for admission.  Assessment & Plan:   Orthostatic hypotension with associated dysautonomia Patient with history of persistent orthostatic hypotension here for evaluation after had a severe postural dizziness this morning prior to going for an MRI. He is significantly orthostatic positive on admission with difficulty getting up due to the dizziness. SBP dropped from 147 lying down to 100 upon standing then 108 upon standing for 3. On chart review, during patient's ER visit at [facility] in November, a CT head showed "Soft tissue attenuation adjacent in the bilateral Prussak space, right greater than left, which could represent cholesteatomas. This can be an etiology of dizziness. Further evaluation with nonemergent/outpatient MRI is recommended." He was seen by his cardiology in November and was started on Florinef. He followed up with cardiology 2 weeks ago due to persistent postural dizziness and his midodrine was increased to 10 mg with plan to increase his droxidopa to 200 mg if symptoms do not improve.  MR brain without contrast with no acute findings.  Cortisol level and TSH within normal limits. -- Midodrine 10 mg PO TID -- Droxidopa increased to 200 mg PO TID -- Florinef increased from 0.1 to 0.2 mg PO daily -- Continue  thigh-high TED hose --  DC IVF today -- Repeat orthostatic vital signs daily qAM -- Fall precautions  Hypokalemia Repleated -- Repeat electrolytes in a.m. to include magnesium  Chronic diastolic congestive heart failure Hx HFrecHF Patient with previous decreased LV function with a EF 30-35% November 2020, now increased to 55-6% on TTE October 2023.  Repeat TTE this hospitalization with LVEF  55-60%, no LV regional wall motion maladies, LV diastolic parameters indeterminate, small pericardial effusion, no aortic stenosis, IVC normal in size.  History of paroxysmal atrial fibrillation/flutter History of right bundle branch block EKG notable for sinus rhythm, with PACs and RBBB.  -- Eliquis 5 mg PO BID  Type 2 diabetes mellitus Patient was recently discontinued off of insulin after losing significant amount of weight.  Hemoglobin A1c -- SSI for coverage -- CBGs qAC/HS  CKD stage IV Creatinine 2.22 on admission, recently 2.14; three weeks prior. -- Cr 2.22>2.30>2.01>1.67 -- Avoid nephrotoxins, renal dose all medications -- BMP daily  Anxiety/depression: -- Lexapro 10 mg p.o. daily  Schizophrenia Patient currently on  risperidone and trihexyphenidyl. Per sister, he has been on these medications for years and they preceded his orthostatic hypotension.  This meds are known causes of orthostatic hypotension. If the etiology of his orthostatic hypotension are not found, he will likely need to be weaned off slowly in the outpatient. -- Continue on risperidone and  trihexyphenidyl for now -- Outpatient follow-up with behavioral health  OSA -- Continue nocturnal CPAP  Weakness/debility/deconditioning: -- PT now recommending SNF placement --Repeat OT evaluation: Pending -- Continue therapy efforts while inpatient -- TOC consulted   DVT prophylaxis: Place TED hose Start: 12/01/23 0818 apixaban (ELIQUIS) tablet 5 mg    Code Status: Full Code Family Communication: Updated family present at bedside extensively yesterday morning  Disposition Plan:  Level of care: Telemetry Medical Status is: Inpatient Remains inpatient appropriate because: Will need SNF placement, TOC following    Consultants:  None  Procedures:  TTE  Antimicrobials:  None   Subjective: Patient seen examined bedside, resting calmly.  Lying in bed.  No family present this morning.  Worked with  mobility tech today, although remains orthostatic in terms of his vital signs this is much improved and now asymptomatic.   Patient with no significant complaints this morning. Denies headache, no dizziness, no chest pain, no palpitations, no shortness of breath, no abdominal pain, no fever, no nausea/vomiting/diarrhea, no paresthesias.  No acute events overnight per nursing staff.  Objective: Vitals:   12/01/23 0801 12/01/23 1626 12/01/23 2035 12/02/23 0457  BP: 129/67 (!) 151/78 (!) 142/67 (!) 140/76  Pulse: 66 79 71 77  Resp: 18 18 18 18   Temp: (!) 97.4 F (36.3 C) 99.6 F (37.6 C) 98.5 F (36.9 C) 98 F (36.7 C)  TempSrc: Oral Oral    SpO2: 100% 100% 100% 100%  Weight:      Height:        Intake/Output Summary (Last 24 hours) at 12/02/2023 1325 Last data filed at 12/02/2023 1248 Gross per 24 hour  Intake 1045.05 ml  Output 1000 ml  Net 45.05 ml   Filed Weights   11/29/23 0832 11/29/23 1716  Weight: 84 kg 84.2 kg    Examination:  Physical Exam: GEN: NAD, alert HEENT: NCAT, PERRL, EOMI, sclera clear, MMM PULM: CTAB w/o wheezes/crackles, normal respiratory effort, on room air CV: RRR w/o M/G/R GI: abd soft, NTND, NABS, no R/G/M MSK: no peripheral edema, moves all extremities independently NEURO: No focal neurological deficits PSYCH: normal mood/affect Integumentary: No  concerning rashes/lesions/wounds noted on exposed skin surface    Data Reviewed: I have personally reviewed following labs and imaging studies  CBC: Recent Labs  Lab 11/29/23 0951 11/29/23 1041 11/30/23 0410 12/01/23 0431  WBC 11.8*  --  12.1* 11.2*  HGB 10.0* 9.5* 9.4* 9.5*  HCT 30.5* 28.0* 28.6* 28.5*  MCV 85.4  --  86.1 84.8  PLT 628*  --  600* 569*   Basic Metabolic Panel: Recent Labs  Lab 11/29/23 0852 11/29/23 1041 11/29/23 1335 11/30/23 0410 12/01/23 0431 12/02/23 0438  NA 135 139  --  137 138 137  K 4.2 3.8  --  4.1 3.4* 4.3  CL 105  --   --  110 113* 112*  CO2 16*  --    --  19* 17* 18*  GLUCOSE 106*  --   --  108* 98 120*  BUN 34*  --   --  32* 32* 24*  CREATININE 2.22*  --   --  2.30* 2.01* 1.67*  CALCIUM 10.2  --   --  9.5 9.4 9.4  MG  --   --  2.1  --   --  2.0  PHOS  --   --  3.4  --   --   --    GFR: Estimated Creatinine Clearance: 50.3 mL/min (A) (by C-G formula based on SCr of 1.67 mg/dL (H)). Liver Function Tests: No results for input(s): "AST", "ALT", "ALKPHOS", "BILITOT", "PROT", "ALBUMIN" in the last 168 hours. No results for input(s): "LIPASE", "AMYLASE" in the last 168 hours. No results for input(s): "AMMONIA" in the last 168 hours. Coagulation Profile: No results for input(s): "INR", "PROTIME" in the last 168 hours. Cardiac Enzymes: No results for input(s): "CKTOTAL", "CKMB", "CKMBINDEX", "TROPONINI" in the last 168 hours. BNP (last 3 results) No results for input(s): "PROBNP" in the last 8760 hours. HbA1C: Recent Labs    12/01/23 0431  HGBA1C 8.1*   CBG: Recent Labs  Lab 12/01/23 1223 12/01/23 1624 12/01/23 2038 12/02/23 0741 12/02/23 1140  GLUCAP 105* 104* 127* 93 83   Lipid Profile: No results for input(s): "CHOL", "HDL", "LDLCALC", "TRIG", "CHOLHDL", "LDLDIRECT" in the last 72 hours. Thyroid Function Tests: Recent Labs    11/29/23 1335  TSH 1.372   Anemia Panel: Recent Labs    11/29/23 1335  VITAMINB12 290   Sepsis Labs: No results for input(s): "PROCALCITON", "LATICACIDVEN" in the last 168 hours.  No results found for this or any previous visit (from the past 240 hours).       Radiology Studies: No results found.       Scheduled Meds:  apixaban  5 mg Oral BID   droxidopa  200 mg Oral TID WC   escitalopram  10 mg Oral Daily   feeding supplement  237 mL Oral BID BM   fludrocortisone  0.2 mg Oral Daily   insulin aspart  0-15 Units Subcutaneous TID WC   midodrine  10 mg Oral TID WC   risperidone  4 mg Oral QHS   trihexyphenidyl  2 mg Oral BID   Continuous Infusions:     LOS: 2 days     Time spent: 52 minutes spent on chart review, discussion with nursing staff, consultants, updating family and interview/physical exam; more than 50% of that time was spent in counseling and/or coordination of care.    Alvira Philips [place], DO [facility] Available via Epic secure chat 7am-7pm After these hours, please refer to coverage provider listed on [email or web address] 12/02/2023,  1:25 PM

## 2023-12-02 NOTE — Plan of Care (Signed)

## 2023-12-03 DIAGNOSIS — I951 Orthostatic hypotension: Secondary | ICD-10-CM | POA: Diagnosis not present

## 2023-12-03 LAB — CBC
HCT: 27.7 % — ABNORMAL LOW (ref 39.0–52.0)
Hemoglobin: 8.9 g/dL — ABNORMAL LOW (ref 13.0–17.0)
MCH: 27.8 pg (ref 26.0–34.0)
MCHC: 32.1 g/dL (ref 30.0–36.0)
MCV: 86.6 fL (ref 80.0–100.0)
Platelets: 645 10*3/uL — ABNORMAL HIGH (ref 150–400)
RBC: 3.2 MIL/uL — ABNORMAL LOW (ref 4.22–5.81)
RDW: 13.2 % (ref 11.5–15.5)
WBC: 11.5 10*3/uL — ABNORMAL HIGH (ref 4.0–10.5)
nRBC: 0 % (ref 0.0–0.2)

## 2023-12-03 LAB — GLUCOSE, CAPILLARY
Glucose-Capillary: 94 mg/dL (ref 70–99)
Glucose-Capillary: 104 mg/dL — ABNORMAL HIGH (ref 70–99)
Glucose-Capillary: 117 mg/dL — ABNORMAL HIGH (ref 70–99)
Glucose-Capillary: 96 mg/dL (ref 70–99)

## 2023-12-03 LAB — BASIC METABOLIC PANEL
Anion gap: 9 (ref 5–15)
BUN: 26 mg/dL — ABNORMAL HIGH (ref 6–20)
CO2: 19 mmol/L — ABNORMAL LOW (ref 22–32)
Calcium: 9.6 mg/dL (ref 8.9–10.3)
Chloride: 108 mmol/L (ref 98–111)
Creatinine, Ser: 2.11 mg/dL — ABNORMAL HIGH (ref 0.61–1.24)
GFR, Estimated: 36 mL/min — ABNORMAL LOW (ref >60)
Glucose, Bld: 127 mg/dL — ABNORMAL HIGH (ref 70–99)
Potassium: 3.6 mmol/L (ref 3.5–5.1)
Sodium: 136 mmol/L (ref 135–145)

## 2023-12-03 MED ORDER — ORAL CARE MOUTH RINSE: 15.0000 mL | OROMUCOSAL | Status: DC | PRN

## 2023-12-03 MED ORDER — POTASSIUM CHLORIDE CRYS ER 20 MEQ PO TBCR
40.0000 meq | EXTENDED_RELEASE_TABLET | Freq: Once | ORAL | Status: AC
  Administered 2023-12-03: 40 meq via ORAL
  Filled 2023-12-03: qty 2

## 2023-12-03 NOTE — Progress Notes (Signed)
Mobility Specialist Progress Note:   12/03/23 1421  Orthostatic Sitting  BP- Sitting 122/86  Orthostatic Standing at 0 minutes  BP- Standing at 0 minutes 107/58  Orthostatic Standing at 3 minutes  BP- Standing at 3 minutes 125/69  Mobility  Activity Ambulated with assistance in room;Ambulated with assistance in hallway  Level of Assistance Contact guard assist, steadying assist  Assistive Device Front wheel walker  Distance Ambulated (ft) 80 ft  Activity Response Tolerated well  Mobility Referral Yes  Mobility visit 1 Mobility  Mobility Specialist Start Time (ACUTE ONLY) 1334  Mobility Specialist Stop Time (ACUTE ONLY) 1347  Mobility Specialist Time Calculation (min) (ACUTE ONLY) 13 min   Pt received in chair and agreeable. Orthostatic vitals taken, see above. Denied any dizziness or lightheadedness throughout. Able to make laps from chair to just outside room door. Asymptomatic throughout. Pt left in chair with call bell and all needs met. Chair alarm on.  D'Vante Earlene Plater Mobility Specialist Please contact via Special educational needs teacher or Rehab office at [phone]

## 2023-12-03 NOTE — Care Management Important Message (Signed)
Important Message  Patient Details  Name: Michael Mcknight MRN: [id number] Date of Birth: 03-Apr-1966   Important Message Given:  Yes - Medicare IM     Dorena Bodo 12/03/2023, 4:10 PM

## 2023-12-03 NOTE — Progress Notes (Signed)
   12/03/23 2033  BiPAP/CPAP/SIPAP  Reason BIPAP/CPAP not in use Non-compliant   Pt refused CPAP for nighttime use.

## 2023-12-03 NOTE — Progress Notes (Signed)
PROGRESS NOTE    Michael Mcknight  ZOX:[id number] DOB: 09/01/1966 DOA: 11/29/2023 PCP: Ellis Parents, FNP    Brief Narrative:   Michael Mcknight is a 58 y.o. male with past medical history significant for A-fib/flutter on Eliquis, HTN, CKD stage IV, chronic systolic congestive heart failure, orthostatic hypotension with dysautonomia, schizophrenia, autism spectrum disorder, OSA who presented to [facility] ED on 1/13 via EMS for complaints of chest pain, dizziness. Per sister, patient developed orthostatic hypotension with severe postural dizziness in August 2024. This is being managed by his PCP and cardiology. Patient continues to be dizzy even with changes to his medications and has had multiple falls secondary to the dizziness. This morning, patient was scheduled to get MRI (unsure what type of MRI) ordered by his PCP. They sat patient in the chair and when he tried to get up, he started having significant dizziness, waving his hands in the air so they called EMS. Patient reports that he has been having dizzy spells almost every day mostly when he stands up or sometimes even while laying down. He describes the spells as lightheadedness however according to sister he does have episodes that looks like the room spinning. He denies any chest pain, shortness of breath, palpitations, headaches or vision changes.   On EMS arrival, patient reported some chest discomfort so he was given ASA 325 mg x 1. He was found to be orthostatic with drop in his SBP to the 90s upon standing.  12-lead was unremarkable.  In the ED, temperature 98.2 F, HR 66, RR 17, BP 136/88, SpO2 100% on room air.  Orthostatic vital signs positive in the ED with SBP dropping from 147 to 95.  WBC 11.8, hemoglobin 10.0, platelet count 628.  Sodium 135, potassium 4.2, chloride 105, CO2 16, glucose 106, BUN 34, creatinine 2.22.  BNP 64.5.  High sensitive troponin 8> 8.  Chest x-ray with no acute cardiopulmonary process.  FOBT negative.  TSH  1.372.  Random cortisol 16.6.  VBG with pH 7.46, pCO2 22.2, PaO2 64, bicarb 16.7.  MRI brain with no acute finding, moderate spinal canal stenosis C 3-4 level.  EKG with sinus rhythm, PACs and RBBB.  Patient unable to ambulate due to his severe postural dizziness.  TRH consulted for admission.  Assessment & Plan:   Orthostatic hypotension with associated dysautonomia Patient with history of persistent orthostatic hypotension here for evaluation after had a severe postural dizziness this morning prior to going for an MRI. He is significantly orthostatic positive on admission with difficulty getting up due to the dizziness. SBP dropped from 147 lying down to 100 upon standing then 108 upon standing for 3. On chart review, during patient's ER visit at [facility] in November, a CT head showed "Soft tissue attenuation adjacent in the bilateral Prussak space, right greater than left, which could represent cholesteatomas. This can be an etiology of dizziness. Further evaluation with nonemergent/outpatient MRI is recommended." He was seen by his cardiology in November and was started on Florinef. He followed up with cardiology 2 weeks ago due to persistent postural dizziness and his midodrine was increased to 10 mg with plan to increase his droxidopa to 200 mg if symptoms do not improve.  MR brain without contrast with no acute findings.  Cortisol level and TSH within normal limits. -- Midodrine 10 mg PO TID -- Droxidopa increased to 200 mg PO TID -- Florinef increased from 0.1 to 0.2 mg PO daily -- Continue thigh-high TED hose -- Repeat  orthostatic vital signs daily  -- Fall precautions  Hypokalemia Repleated  Chronic diastolic congestive heart failure Hx HFrecHF Patient with previous decreased LV function with a EF 30-35% November 2020, now increased to 55-6% on TTE October 2023.  Repeat TTE this hospitalization with LVEF 55-60%, no LV regional wall motion maladies, LV diastolic parameters  indeterminate, small pericardial effusion, no aortic stenosis, IVC normal in size.  History of paroxysmal atrial fibrillation/flutter History of right bundle branch block EKG notable for sinus rhythm, with PACs and RBBB.  -- Eliquis 5 mg PO BID  Type 2 diabetes mellitus Patient was recently discontinued off of insulin after losing significant amount of weight.  Hemoglobin A1c -- SSI for coverage -- CBGs qAC/HS  CKD stage IV Creatinine 2.22 on admission, recently 2.14; three weeks prior. -- Cr 2.22>2.30>2.01>1.67>2.11 -- Avoid nephrotoxins, renal dose all medications -- BMP daily  Anxiety/depression: -- Lexapro 10 mg p.o. daily  Schizophrenia Patient currently on  risperidone and trihexyphenidyl. Per sister, he has been on these medications for years and they preceded his orthostatic hypotension.  This meds are known causes of orthostatic hypotension. If the etiology of his orthostatic hypotension are not found, he will likely need to be weaned off slowly in the outpatient. -- Continue on risperidone and trihexyphenidyl for now -- Outpatient follow-up with behavioral health  OSA -- Continue nocturnal CPAP  Weakness/debility/deconditioning: -- PT/OT now recommending SNF placement -- Continue therapy efforts while inpatient -- TOC consulted   DVT prophylaxis: Place TED hose Start: 12/01/23 0818 apixaban (ELIQUIS) tablet 5 mg    Code Status: Full Code Family Communication: Updated family present at bedside extensively yesterday morning  Disposition Plan:  Level of care: Telemetry Medical Status is: Inpatient Remains inpatient appropriate because: Pending SNF placement, TOC consulted    Consultants:  None  Procedures:  TTE  Antimicrobials:  None   Subjective: Patient seen examined bedside, resting calmly.  Sitting in bedside chair.  Patient reports some mild dizziness while ambulating to the bathroom and sitting on the commode, now resolved.   No family present  this morning.  Patient with no other complaints this morning. Denies headache, no dizziness, no chest pain, no palpitations, no shortness of breath, no abdominal pain, no fever, no nausea/vomiting/diarrhea, no paresthesias.  No acute events overnight per nursing staff.  Objective: Vitals:   12/02/23 1647 12/02/23 2119 12/03/23 0448 12/03/23 0934  BP: (!) 153/74 (!) 173/74 (!) 129/57 (!) 94/49  Pulse: 68 73 88 90  Resp: 19 18 18 18   Temp:  98.1 F (36.7 C) 98.7 F (37.1 C) 98.1 F (36.7 C)  TempSrc:    Oral  SpO2: 100% 100% 100% 100%  Weight:      Height:        Intake/Output Summary (Last 24 hours) at 12/03/2023 1200 Last data filed at 12/03/2023 1156 Gross per 24 hour  Intake 1490 ml  Output 1750 ml  Net -260 ml   Filed Weights   11/29/23 0832 11/29/23 1716  Weight: 84 kg 84.2 kg    Examination:  Physical Exam: GEN: NAD, alert HEENT: NCAT, PERRL, EOMI, sclera clear, MMM PULM: CTAB w/o wheezes/crackles, normal respiratory effort, on room air CV: RRR w/o M/G/R GI: abd soft, NTND, NABS, no R/G/M MSK: no peripheral edema, moves all extremities independently NEURO: No focal neurological deficits PSYCH: normal mood/affect Integumentary: No concerning rashes/lesions/wounds noted on exposed skin surface    Data Reviewed: I have personally reviewed following labs and imaging studies  CBC: Recent Labs  Lab 11/29/23 0951 11/29/23 1041 11/30/23 0410 12/01/23 0431 12/03/23 0818  WBC 11.8*  --  12.1* 11.2* 11.5*  HGB 10.0* 9.5* 9.4* 9.5* 8.9*  HCT 30.5* 28.0* 28.6* 28.5* 27.7*  MCV 85.4  --  86.1 84.8 86.6  PLT 628*  --  600* 569* 645*   Basic Metabolic Panel: Recent Labs  Lab 11/29/23 0852 11/29/23 1041 11/29/23 1335 11/30/23 0410 12/01/23 0431 12/02/23 0438 12/03/23 0818  NA 135 139  --  137 138 137 136  K 4.2 3.8  --  4.1 3.4* 4.3 3.6  CL 105  --   --  110 113* 112* 108  CO2 16*  --   --  19* 17* 18* 19*  GLUCOSE 106*  --   --  108* 98 120* 127*  BUN  34*  --   --  32* 32* 24* 26*  CREATININE 2.22*  --   --  2.30* 2.01* 1.67* 2.11*  CALCIUM 10.2  --   --  9.5 9.4 9.4 9.6  MG  --   --  2.1  --   --  2.0  --   PHOS  --   --  3.4  --   --   --   --    GFR: Estimated Creatinine Clearance: 39.8 mL/min (A) (by C-G formula based on SCr of 2.11 mg/dL (H)). Liver Function Tests: No results for input(s): "AST", "ALT", "ALKPHOS", "BILITOT", "PROT", "ALBUMIN" in the last 168 hours. No results for input(s): "LIPASE", "AMYLASE" in the last 168 hours. No results for input(s): "AMMONIA" in the last 168 hours. Coagulation Profile: No results for input(s): "INR", "PROTIME" in the last 168 hours. Cardiac Enzymes: No results for input(s): "CKTOTAL", "CKMB", "CKMBINDEX", "TROPONINI" in the last 168 hours. BNP (last 3 results) No results for input(s): "PROBNP" in the last 8760 hours. HbA1C: Recent Labs    12/01/23 0431  HGBA1C 8.1*   CBG: Recent Labs  Lab 12/02/23 0741 12/02/23 1140 12/02/23 1647 12/02/23 2116 12/03/23 0734  GLUCAP 93 83 87 111* 94   Lipid Profile: No results for input(s): "CHOL", "HDL", "LDLCALC", "TRIG", "CHOLHDL", "LDLDIRECT" in the last 72 hours. Thyroid Function Tests: No results for input(s): "TSH", "T4TOTAL", "FREET4", "T3FREE", "THYROIDAB" in the last 72 hours.  Anemia Panel: No results for input(s): "VITAMINB12", "FOLATE", "FERRITIN", "TIBC", "IRON", "RETICCTPCT" in the last 72 hours.  Sepsis Labs: No results for input(s): "PROCALCITON", "LATICACIDVEN" in the last 168 hours.  No results found for this or any previous visit (from the past 240 hours).       Radiology Studies: No results found.       Scheduled Meds:  apixaban  5 mg Oral BID   droxidopa  200 mg Oral TID WC   escitalopram  10 mg Oral Daily   feeding supplement  237 mL Oral BID BM   fludrocortisone  0.2 mg Oral Daily   insulin aspart  0-15 Units Subcutaneous TID WC   midodrine  10 mg Oral TID WC   risperidone  4 mg Oral QHS    trihexyphenidyl  2 mg Oral BID   Continuous Infusions:     LOS: 3 days    Time spent: 50 minutes spent on chart review, discussion with nursing staff, consultants, updating family and interview/physical exam; more than 50% of that time was spent in counseling and/or coordination of care.    Alvira Philips [place], DO [facility] Available via Epic secure chat 7am-7pm After these hours, please refer to coverage provider  listed on [email or web address] 12/03/2023, 12:00 PM

## 2023-12-03 NOTE — TOC Progression Note (Addendum)
Transition of Care [facility]) - Progression Note    Patient Details  Name: PERICLES CLUSTER MRN: [id number] Date of Birth: December 10, 1965  [facility] CM/SW Contact  Erin Sons, [place] Phone Number: 12/03/2023, 11:27 AM  Clinical Narrative:     CSW called pt's sister and provided SNF bed offers. Waiting for response from [facility] as well. Sister will discuss further with family and contact CSW back with SNF choice.   1130: Sister states that they would like [facility]. CSW awaiting confirmation from [facility] and then will initiate insurance authorization.   1330: Bed confirmed with [facility]. CSW initiated SNF authorization request; status pending.   [facility] will need to order CPAP as well.   Expected Discharge Plan: Skilled Nursing Facility Barriers to Discharge: No Barriers Identified  Expected Discharge Plan and Services       Living arrangements for the past 2 months: Single Family Home                                       Social Determinants of Health (SDOH) Interventions SDOH Screenings   Food Insecurity: No Food Insecurity (11/30/2023)  Housing: Low Risk  (11/30/2023)  Transportation Needs: No Transportation Needs (11/30/2023)  Utilities: Not At Risk (11/30/2023)  Tobacco Use: Low Risk  (11/15/2023)    Readmission Risk Interventions    11/30/2023    5:09 PM  Readmission Risk Prevention Plan  Post Dischage Appt Complete  Medication Screening Complete  Transportation Screening Complete

## 2023-12-03 NOTE — Plan of Care (Signed)

## 2023-12-03 NOTE — Progress Notes (Signed)
Physical Therapy Treatment Patient Details Name: Michael Mcknight MRN: [id number] DOB: [birth year] Today's Date: 12/03/2023   History of Present Illness The pt is a 58 yo male presenting 1/13 with chest pain and dizziness. Admitted for management of orthostatic hypotension. PMH includes: afib.flutter, HTN, CKD IV, HFrEF, OSA, orthostatic hypotension,  dysautonomia, schizophrenia and autism spectrum disorder.    PT Comments  The pt was agreeable to session with focus on progressing OOB mobility and activity tolerance. He was able to stand from recliner x3 in session with CGA and cues for hand placement when using RW. He did have drops in BP after prolonged standing and ambulation (see below). Pt reports sx of fatigue only today, not dizziness. 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    VITALS:  - sitting in recliner - BP: 114/64 (70); HR: 71bpm - standing - BP: 100/63 (75); HR: 99bpm - standing after 3 min - BP: 95/54 (64); HR: 99bpm - sitting EOB - BP: 112/60 (74); HR: 99bpm - standing post-ambulation - BP: 109/54 (67); HR: 77bpm   If plan is discharge home, recommend the following: Assist for transportation;Help with stairs or ramp for entrance   Can travel by private vehicle     Yes  Equipment Recommendations  Other (comment) (defer to post acute)    Recommendations for Other Services       Precautions / Restrictions Precautions Precautions: Fall Precaution Comments: hand tremors, difficulty hearing and SOB are signs of dizziness per sister Restrictions Weight Bearing Restrictions Per Provider Order: No     Mobility  Bed Mobility               General bed mobility comments: pt OOB at start and end of session    Transfers Overall transfer level: Needs  assistance Equipment used: Rolling walker (2 wheels) Transfers: Sit to/from Stand Sit to Stand: Contact guard assist           General transfer comment: CGA for safety, no overt LOB, cues for hand placement    Ambulation/Gait Ambulation/Gait assistance: Contact guard assist, Min assist Gait Distance (Feet): 45 Feet Assistive device: Rolling walker (2 wheels) Gait Pattern/deviations: Step-through pattern Gait velocity: decreased Gait velocity interpretation: <1.31 ft/sec, indicative of household ambulator   General Gait Details: close guard for safety, no support other than use of RW. BP dropped after activity      Balance Overall balance assessment: Needs assistance Sitting-balance support: Feet supported Sitting balance-Leahy Scale: Good     Standing balance support: During functional activity Standing balance-Leahy Scale: Fair Standing balance comment: dependent on BUE support                            Cognition Arousal: Alert Behavior During Therapy: WFL for tasks assessed/performed, Anxious Overall Cognitive Status: History of cognitive impairments - at baseline                                 General Comments: no family present to conform baseline, following commands well in session        Exercises      General Comments General comments (skin integrity, edema, etc.): orthostatic      Pertinent Vitals/Pain Pain Assessment  Pain Assessment: No/denies pain     PT Goals (current goals can now be found in the care plan section) Acute Rehab PT Goals Patient Stated Goal: Does not want to fall again PT Goal Formulation: With patient Time For Goal Achievement: 12/14/23 Potential to Achieve Goals: Good Progress towards PT goals: Progressing toward goals    Frequency    Min 1X/week       AM-PAC PT "6 Clicks" Mobility   Outcome Measure  Help needed turning from your back to your side while in a flat bed without using  bedrails?: None Help needed moving from lying on your back to sitting on the side of a flat bed without using bedrails?: A Little Help needed moving to and from a bed to a chair (including a wheelchair)?: A Little Help needed standing up from a chair using your arms (e.g., wheelchair or bedside chair)?: A Lot Help needed to walk in hospital room?: A Lot Help needed climbing 3-5 steps with a railing? : Total 6 Click Score: 15    End of Session Equipment Utilized During Treatment: Gait belt Activity Tolerance: Other (comment) (orthostatic hypotension) Patient left: in chair;with call bell/phone within reach;with chair alarm set Nurse Communication: Mobility status PT Visit Diagnosis: Other abnormalities of gait and mobility (R26.89);Dizziness and giddiness (R42)     Time: 6440-3474 PT Time Calculation (min) (ACUTE ONLY): 17 min  Charges:    $Therapeutic Exercise: 8-22 mins PT General Charges $$ ACUTE PT VISIT: 1 Visit                     Vickki Muff, PT, DPT   Acute Rehabilitation Department Office [phone] Secure Chat Communication Preferred   Ronnie Derby 12/03/2023, 1:07 PM

## 2023-12-04 DIAGNOSIS — I951 Orthostatic hypotension: Secondary | ICD-10-CM | POA: Diagnosis not present

## 2023-12-04 LAB — GLUCOSE, CAPILLARY
Glucose-Capillary: 89 mg/dL (ref 70–99)
Glucose-Capillary: 74 mg/dL (ref 70–99)
Glucose-Capillary: 102 mg/dL — ABNORMAL HIGH (ref 70–99)
Glucose-Capillary: 103 mg/dL — ABNORMAL HIGH (ref 70–99)

## 2023-12-04 NOTE — TOC Progression Note (Signed)
Transition of Care [facility]) - Progression Note    Patient Details  Name: TORINO GORDEN MRN: [id number] Date of Birth: November 12, 1966  [facility]) CM/SW Contact  Deatra Robinson, [place] Phone Number: 12/04/2023, 10:33 AM  Clinical Narrative: Berkley Harvey for [facility] remains pending at this time. Spoke to Kia with [facility] who reports they have not ordered CPAP but will start this process. SW will follow.   Dellie Burns, MSW, LCSW [phone] (coverage)        Expected Discharge Plan: Skilled Nursing Facility Barriers to Discharge: No Barriers Identified  Expected Discharge Plan and Services       Living arrangements for the past 2 months: Single Family Home                                       Social Determinants of Health (SDOH) Interventions SDOH Screenings   Food Insecurity: No Food Insecurity (11/30/2023)  Housing: Low Risk  (11/30/2023)  Transportation Needs: No Transportation Needs (11/30/2023)  Utilities: Not At Risk (11/30/2023)  Tobacco Use: Low Risk  (11/15/2023)    Readmission Risk Interventions    11/30/2023    5:09 PM  Readmission Risk Prevention Plan  Post Dischage Appt Complete  Medication Screening Complete  Transportation Screening Complete

## 2023-12-04 NOTE — Progress Notes (Signed)
PROGRESS NOTE    [name]  NWG:[id number] DOB: 1966/02/20 DOA: 11/29/2023 PCP: Ellis Parents, FNP    Brief Narrative:   Michael Mcknight is a 58 y.o. male with past medical history significant for A-fib/flutter on Eliquis, HTN, CKD stage IV, chronic systolic congestive heart failure, orthostatic hypotension with dysautonomia, schizophrenia, autism spectrum disorder, OSA who presented to [facility] ED on 1/13 via EMS for complaints of chest pain, dizziness. Per sister, patient developed orthostatic hypotension with severe postural dizziness in August 2024. This is being managed by his PCP and cardiology. Patient continues to be dizzy even with changes to his medications and has had multiple falls secondary to the dizziness. This morning, patient was scheduled to get MRI (unsure what type of MRI) ordered by his PCP. They sat patient in the chair and when he tried to get up, he started having significant dizziness, waving his hands in the air so they called EMS. Patient reports that he has been having dizzy spells almost every day mostly when he stands up or sometimes even while laying down. He describes the spells as lightheadedness however according to sister he does have episodes that looks like the room spinning. He denies any chest pain, shortness of breath, palpitations, headaches or vision changes.   On EMS arrival, patient reported some chest discomfort so he was given ASA 325 mg x 1. He was found to be orthostatic with drop in his SBP to the 90s upon standing.  12-lead was unremarkable.  In the ED, temperature 98.2 F, HR 66, RR 17, BP 136/88, SpO2 100% on room air.  Orthostatic vital signs positive in the ED with SBP dropping from 147 to 95.  WBC 11.8, hemoglobin 10.0, platelet count 628.  Sodium 135, potassium 4.2, chloride 105, CO2 16, glucose 106, BUN 34, creatinine 2.22.  BNP 64.5.  High sensitive troponin 8> 8.  Chest x-ray with no acute cardiopulmonary process.  FOBT negative.  TSH  1.372.  Random cortisol 16.6.  VBG with pH 7.46, pCO2 22.2, PaO2 64, bicarb 16.7.  MRI brain with no acute finding, moderate spinal canal stenosis C 3-4 level.  EKG with sinus rhythm, PACs and RBBB.  Patient unable to ambulate due to his severe postural dizziness.  TRH consulted for admission.  Assessment & Plan:   Orthostatic hypotension with associated dysautonomia Patient with history of persistent orthostatic hypotension here for evaluation after had a severe postural dizziness this morning prior to going for an MRI. He is significantly orthostatic positive on admission with difficulty getting up due to the dizziness. SBP dropped from 147 lying down to 100 upon standing then 108 upon standing for 3. On chart review, during patient's ER visit at [facility] in November, a CT head showed "Soft tissue attenuation adjacent in the bilateral Prussak space, right greater than left, which could represent cholesteatomas. This can be an etiology of dizziness. Further evaluation with nonemergent/outpatient MRI is recommended." He was seen by his cardiology in November and was started on Florinef. He followed up with cardiology 2 weeks ago due to persistent postural dizziness and his midodrine was increased to 10 mg with plan to increase his droxidopa to 200 mg if symptoms do not improve.  MR brain without contrast with no acute findings.  Cortisol level and TSH within normal limits. -- Midodrine 10 mg PO TID -- Droxidopa increased to 200 mg PO TID -- Florinef increased from 0.1 to 0.2 mg PO daily -- Continue thigh-high TED hose -- Fall  precautions -- Pending insurance authorization for [facility]  Hypokalemia Repleated  Chronic diastolic congestive heart failure Hx HFrecHF Patient with previous decreased LV function with a EF 30-35% November 2020, now increased to 55-6% on TTE October 2023.  Repeat TTE this hospitalization with LVEF 55-60%, no LV regional wall motion maladies, LV  diastolic parameters indeterminate, small pericardial effusion, no aortic stenosis, IVC normal in size.  History of paroxysmal atrial fibrillation/flutter History of right bundle branch block EKG notable for sinus rhythm, with PACs and RBBB.  -- Eliquis 5 mg PO BID  Type 2 diabetes mellitus Patient was recently discontinued off of insulin after losing significant amount of weight.  Hemoglobin A1c -- SSI for coverage -- CBGs qAC/HS  CKD stage IV Creatinine 2.22 on admission, recently 2.14; three weeks prior. -- Cr 2.22>2.30>2.01>1.67>2.11 -- Avoid nephrotoxins, renal dose all medications -- BMP daily  Anxiety/depression: -- Lexapro 10 mg p.o. daily  Schizophrenia Patient currently on  risperidone and trihexyphenidyl. Per sister, he has been on these medications for years and they preceded his orthostatic hypotension.  This meds are known causes of orthostatic hypotension. If the etiology of his orthostatic hypotension are not found, he will likely need to be weaned off slowly in the outpatient. -- Continue on risperidone and trihexyphenidyl for now -- Outpatient follow-up with behavioral health  OSA -- Continue nocturnal CPAP; noncompliant  Weakness/debility/deconditioning: -- PT/OT now recommending SNF placement -- Continue therapy efforts while inpatient -- TOC consulted   DVT prophylaxis: Place TED hose Start: 12/01/23 0818 apixaban (ELIQUIS) tablet 5 mg    Code Status: Full Code Family Communication: No family present at bedside this morning  Disposition Plan:  Level of care: Med-Surg Status is: Inpatient Remains inpatient appropriate because: Pending insurance authorization for [facility] healthcare SNF, medically stable for discharge once received    Consultants:  None  Procedures:  TTE  Antimicrobials:  None   Subjective: Patient seen examined bedside, resting calmly.  Sitting in bedside chair, watching TV.  Denies dizziness upon ambulation from the bed to  chair this morning..  No family present this morning.  Patient with no complaints this morning. Denies headache, no dizziness, no chest pain, no palpitations, no shortness of breath, no abdominal pain, no fever, no nausea/vomiting/diarrhea, no paresthesias.  No acute events overnight per nursing staff.  Objective: Vitals:   12/03/23 2052 12/04/23 0524 12/04/23 0748 12/04/23 1104  BP: (!) 122/59 (!) 101/49 90/65   Pulse: 75 89 75   Resp:  18 16   Temp: 98.1 F (36.7 C) 98 F (36.7 C) (!) 97.5 F (36.4 C)   TempSrc: Oral Axillary Oral   SpO2: 100% 100% 100% 100%  Weight:      Height:        Intake/Output Summary (Last 24 hours) at 12/04/2023 1333 Last data filed at 12/04/2023 0516 Gross per 24 hour  Intake 480 ml  Output 1000 ml  Net -520 ml   Filed Weights   11/29/23 0832 11/29/23 1716  Weight: 84 kg 84.2 kg    Examination:  Physical Exam: GEN: NAD, alert HEENT: NCAT, PERRL, EOMI, sclera clear, MMM PULM: CTAB w/o wheezes/crackles, normal respiratory effort, on room air CV: RRR w/o M/G/R GI: abd soft, NTND, NABS, no R/G/M MSK: no peripheral edema, moves all extremities independently NEURO: No focal neurological deficits PSYCH: normal mood/affect Integumentary: No concerning rashes/lesions/wounds noted on exposed skin surface    Data Reviewed: I have personally reviewed following labs and imaging studies  CBC: Recent  Labs  Lab 11/29/23 0951 11/29/23 1041 11/30/23 0410 12/01/23 0431 12/03/23 0818  WBC 11.8*  --  12.1* 11.2* 11.5*  HGB 10.0* 9.5* 9.4* 9.5* 8.9*  HCT 30.5* 28.0* 28.6* 28.5* 27.7*  MCV 85.4  --  86.1 84.8 86.6  PLT 628*  --  600* 569* 645*   Basic Metabolic Panel: Recent Labs  Lab 11/29/23 0852 11/29/23 1041 11/29/23 1335 11/30/23 0410 12/01/23 0431 12/02/23 0438 12/03/23 0818  NA 135 139  --  137 138 137 136  K 4.2 3.8  --  4.1 3.4* 4.3 3.6  CL 105  --   --  110 113* 112* 108  CO2 16*  --   --  19* 17* 18* 19*  GLUCOSE 106*  --   --   108* 98 120* 127*  BUN 34*  --   --  32* 32* 24* 26*  CREATININE 2.22*  --   --  2.30* 2.01* 1.67* 2.11*  CALCIUM 10.2  --   --  9.5 9.4 9.4 9.6  MG  --   --  2.1  --   --  2.0  --   PHOS  --   --  3.4  --   --   --   --    GFR: Estimated Creatinine Clearance: 39.8 mL/min (A) (by C-G formula based on SCr of 2.11 mg/dL (H)). Liver Function Tests: No results for input(s): "AST", "ALT", "ALKPHOS", "BILITOT", "PROT", "ALBUMIN" in the last 168 hours. No results for input(s): "LIPASE", "AMYLASE" in the last 168 hours. No results for input(s): "AMMONIA" in the last 168 hours. Coagulation Profile: No results for input(s): "INR", "PROTIME" in the last 168 hours. Cardiac Enzymes: No results for input(s): "CKTOTAL", "CKMB", "CKMBINDEX", "TROPONINI" in the last 168 hours. BNP (last 3 results) No results for input(s): "PROBNP" in the last 8760 hours. HbA1C: No results for input(s): "HGBA1C" in the last 72 hours.  CBG: Recent Labs  Lab 12/03/23 1214 12/03/23 1633 12/03/23 2049 12/04/23 0744 12/04/23 1140  GLUCAP 104* 117* 96 89 74   Lipid Profile: No results for input(s): "CHOL", "HDL", "LDLCALC", "TRIG", "CHOLHDL", "LDLDIRECT" in the last 72 hours. Thyroid Function Tests: No results for input(s): "TSH", "T4TOTAL", "FREET4", "T3FREE", "THYROIDAB" in the last 72 hours.  Anemia Panel: No results for input(s): "VITAMINB12", "FOLATE", "FERRITIN", "TIBC", "IRON", "RETICCTPCT" in the last 72 hours.  Sepsis Labs: No results for input(s): "PROCALCITON", "LATICACIDVEN" in the last 168 hours.  No results found for this or any previous visit (from the past 240 hours).       Radiology Studies: No results found.       Scheduled Meds:  apixaban  5 mg Oral BID   droxidopa  200 mg Oral TID WC   escitalopram  10 mg Oral Daily   feeding supplement  237 mL Oral BID BM   fludrocortisone  0.2 mg Oral Daily   insulin aspart  0-15 Units Subcutaneous TID WC   midodrine  10 mg Oral TID WC    risperidone  4 mg Oral QHS   trihexyphenidyl  2 mg Oral BID   Continuous Infusions:     LOS: 4 days    Time spent: 50 minutes spent on chart review, discussion with nursing staff, consultants, updating family and interview/physical exam; more than 50% of that time was spent in counseling and/or coordination of care.    Alvira Philips [place], DO [facility] Available via Epic secure chat 7am-7pm After these hours, please refer to coverage  provider listed on [email or web address] 12/04/2023, 1:33 PM

## 2023-12-04 NOTE — Plan of Care (Signed)

## 2023-12-05 DIAGNOSIS — I951 Orthostatic hypotension: Secondary | ICD-10-CM | POA: Diagnosis not present

## 2023-12-05 LAB — GLUCOSE, CAPILLARY
Glucose-Capillary: 86 mg/dL (ref 70–99)
Glucose-Capillary: 86 mg/dL (ref 70–99)
Glucose-Capillary: 72 mg/dL (ref 70–99)
Glucose-Capillary: 99 mg/dL (ref 70–99)

## 2023-12-05 LAB — BASIC METABOLIC PANEL
Anion gap: 9 (ref 5–15)
BUN: 26 mg/dL — ABNORMAL HIGH (ref 6–20)
CO2: 19 mmol/L — ABNORMAL LOW (ref 22–32)
Calcium: 9.6 mg/dL (ref 8.9–10.3)
Chloride: 109 mmol/L (ref 98–111)
Creatinine, Ser: 1.87 mg/dL — ABNORMAL HIGH (ref 0.61–1.24)
GFR, Estimated: 42 mL/min — ABNORMAL LOW (ref >60)
Glucose, Bld: 99 mg/dL (ref 70–99)
Potassium: 3.9 mmol/L (ref 3.5–5.1)
Sodium: 137 mmol/L (ref 135–145)

## 2023-12-05 LAB — CBC
HCT: 28.2 % — ABNORMAL LOW (ref 39.0–52.0)
Hemoglobin: 9.1 g/dL — ABNORMAL LOW (ref 13.0–17.0)
MCH: 27.8 pg (ref 26.0–34.0)
MCHC: 32.3 g/dL (ref 30.0–36.0)
MCV: 86.2 fL (ref 80.0–100.0)
Platelets: 639 10*3/uL — ABNORMAL HIGH (ref 150–400)
RBC: 3.27 MIL/uL — ABNORMAL LOW (ref 4.22–5.81)
RDW: 13.2 % (ref 11.5–15.5)
WBC: 9.7 10*3/uL (ref 4.0–10.5)
nRBC: 0 % (ref 0.0–0.2)

## 2023-12-05 NOTE — Progress Notes (Signed)
Mobility Specialist: Progress Note   12/05/23 1205  Orthostatic Lying   BP- Lying 114/68  Orthostatic Sitting  BP- Sitting 91/49  Orthostatic Standing at 0 minutes  BP- Standing at 0 minutes 91/56  Orthostatic Standing at 3 minutes  BP- Standing at 3 minutes (!) 96/24  Mobility  Activity Ambulated with assistance in hallway  Level of Assistance Contact guard assist, steadying assist  Assistive Device Front wheel walker  Distance Ambulated (ft) 80 ft  Activity Response Tolerated well  Mobility Referral Yes  Mobility visit 1 Mobility  Mobility Specialist Start Time (ACUTE ONLY) 0954  Mobility Specialist Stop Time (ACUTE ONLY) 1014  Mobility Specialist Time Calculation (min) (ACUTE ONLY) 20 min    Orthostatics taken from previous note - tolerated well.   Maurene Capes Mobility Specialist Please contact via SecureChat or Rehab office at [phone]

## 2023-12-05 NOTE — Progress Notes (Signed)
PROGRESS NOTE    Michael Mcknight  WUJ:[id number] DOB: [birth year] DOA: 11/29/2023 PCP: Michael Parents, FNP    Brief Narrative:   Michael Mcknight is a 58 y.o. male with past medical history significant for A-fib/flutter on Eliquis, HTN, CKD stage IV, chronic systolic congestive heart failure, orthostatic hypotension with dysautonomia, schizophrenia, autism spectrum disorder, OSA who presented to [facility] ED on 1/13 via EMS for complaints of chest pain, dizziness. Per sister, patient developed orthostatic hypotension with severe postural dizziness in August 2024. This is being managed by his PCP and cardiology. Patient continues to be dizzy even with changes to his medications and has had multiple falls secondary to the dizziness. This morning, patient was scheduled to get MRI (unsure what type of MRI) ordered by his PCP. They sat patient in the chair and when he tried to get up, he started having significant dizziness, waving his hands in the air so they called EMS. Patient reports that he has been having dizzy spells almost every day mostly when he stands up or sometimes even while laying down. He describes the spells as lightheadedness however according to sister he does have episodes that looks like the room spinning. He denies any chest pain, shortness of breath, palpitations, headaches or vision changes.   On EMS arrival, patient reported some chest discomfort so he was given ASA 325 mg x 1. He was found to be orthostatic with drop in his SBP to the 90s upon standing.  12-lead was unremarkable.  In the ED, temperature 98.2 F, HR 66, RR 17, BP 136/88, SpO2 100% on room air.  Orthostatic vital signs positive in the ED with SBP dropping from 147 to 95.  WBC 11.8, hemoglobin 10.0, platelet count 628.  Sodium 135, potassium 4.2, chloride 105, CO2 16, glucose 106, BUN 34, creatinine 2.22.  BNP 64.5.  High sensitive troponin 8> 8.  Chest x-ray with no acute cardiopulmonary process.  FOBT negative.  TSH  1.372.  Random cortisol 16.6.  VBG with pH 7.46, pCO2 22.2, PaO2 64, bicarb 16.7.  MRI brain with no acute finding, moderate spinal canal stenosis C 3-4 level.  EKG with sinus rhythm, PACs and RBBB.  Patient unable to ambulate due to his severe postural dizziness.  TRH consulted for admission.  Assessment & Plan:   Orthostatic hypotension with associated dysautonomia Patient with history of persistent orthostatic hypotension here for evaluation after had a severe postural dizziness this morning prior to going for an MRI. He is significantly orthostatic positive on admission with difficulty getting up due to the dizziness. SBP dropped from 147 lying down to 100 upon standing then 108 upon standing for 3. On chart review, during patient's ER visit at [facility] in November, a CT head showed "Soft tissue attenuation adjacent in the bilateral Prussak space, right greater than left, which could represent cholesteatomas. This can be an etiology of dizziness. Further evaluation with nonemergent/outpatient MRI is recommended." He was seen by his cardiology in November and was started on Florinef. He followed up with cardiology 2 weeks ago due to persistent postural dizziness and his midodrine was increased to 10 mg with plan to increase his droxidopa to 200 mg if symptoms do not improve.  MR brain without contrast with no acute findings.  Cortisol level and TSH within normal limits. -- Midodrine 10 mg PO TID -- Droxidopa increased to 200 mg PO TID -- Florinef increased from 0.1 to 0.2 mg PO daily -- Continue thigh-high TED hose -- Fall  precautions -- Pending insurance authorization for [facility]  Hypokalemia Repleated  Chronic diastolic congestive heart failure Hx HFrecHF Patient with previous decreased LV function with a EF 30-35% November 2020, now increased to 55-6% on TTE October 2023.  Repeat TTE this hospitalization with LVEF 55-60%, no LV regional wall motion maladies, LV  diastolic parameters indeterminate, small pericardial effusion, no aortic stenosis, IVC normal in size.  History of paroxysmal atrial fibrillation/flutter History of right bundle branch block EKG notable for sinus rhythm, with PACs and RBBB.  -- Eliquis 5 mg PO BID  Type 2 diabetes mellitus Patient was recently discontinued off of insulin after losing significant amount of weight.  Hemoglobin A1c -- SSI for coverage -- CBGs qAC/HS  CKD stage IV Creatinine 2.22 on admission, recently 2.14; three weeks prior. -- Cr 2.22>2.30>2.01>1.67>2.11>1.87 -- Avoid nephrotoxins, renal dose all medications -- BMP daily  Anxiety/depression: -- Lexapro 10 mg p.o. daily  Schizophrenia Patient currently on  risperidone and trihexyphenidyl. Per sister, he has been on these medications for years and they preceded his orthostatic hypotension.  This meds are known causes of orthostatic hypotension. If the etiology of his orthostatic hypotension are not found, he will likely need to be weaned off slowly in the outpatient. -- Continue on risperidone and trihexyphenidyl for now -- Outpatient follow-up with behavioral health  OSA -- Continue nocturnal CPAP; noncompliant  Weakness/debility/deconditioning: -- PT/OT now recommending SNF placement -- Continue therapy efforts while inpatient -- TOC consulted   DVT prophylaxis: Place TED hose Start: 12/01/23 0818 apixaban (ELIQUIS) tablet 5 mg    Code Status: Full Code Family Communication: No family present at bedside this morning  Disposition Plan:  Level of care: Med-Surg Status is: Inpatient Remains inpatient appropriate because: Pending insurance authorization for [facility] SNF, medically stable for discharge once received    Consultants:  None  Procedures:  TTE  Antimicrobials:  None   Subjective: Patient seen examined bedside, resting calmly.  Lying in bed watching TV.  No complaints this morning.  Continues without  dizziness. Denies headache, no dizziness, no chest pain, no palpitations, no shortness of breath, no abdominal pain, no fever, no nausea/vomiting/diarrhea, no paresthesias.  No acute events overnight per nursing staff.  Awaiting SNF placement.  Objective: Vitals:   12/04/23 1655 12/04/23 2122 12/05/23 0621 12/05/23 0752  BP: 97/65 113/63 115/69 129/75  Pulse: 80 (!) 101 71 89  Resp: 18 18 18 18   Temp: 97.6 F (36.4 C) 98.6 F (37 C) 98.5 F (36.9 C) 98.3 F (36.8 C)  TempSrc: Oral Oral Oral Oral  SpO2: 100% 100% 99% 100%  Weight:  84.3 kg    Height:        Intake/Output Summary (Last 24 hours) at 12/05/2023 1206 Last data filed at 12/05/2023 4782 Gross per 24 hour  Intake 500 ml  Output 1700 ml  Net -1200 ml   Filed Weights   11/29/23 0832 11/29/23 1716 12/04/23 2122  Weight: 84 kg 84.2 kg 84.3 kg    Examination:  Physical Exam: GEN: NAD, alert HEENT: NCAT, PERRL, EOMI, sclera clear, MMM PULM: CTAB w/o wheezes/crackles, normal respiratory effort, on room air CV: RRR w/o M/G/R GI: abd soft, NTND, NABS, no R/G/M MSK: no peripheral edema, moves all extremities independently NEURO: No focal neurological deficits PSYCH: normal mood/affect Integumentary: No concerning rashes/lesions/wounds noted on exposed skin surface    Data Reviewed: I have personally reviewed following labs and imaging studies  CBC: Recent Labs  Lab 11/29/23 0951 11/29/23 1041  11/30/23 0410 12/01/23 0431 12/03/23 0818 12/05/23 0747  WBC 11.8*  --  12.1* 11.2* 11.5* 9.7  HGB 10.0* 9.5* 9.4* 9.5* 8.9* 9.1*  HCT 30.5* 28.0* 28.6* 28.5* 27.7* 28.2*  MCV 85.4  --  86.1 84.8 86.6 86.2  PLT 628*  --  600* 569* 645* 639*   Basic Metabolic Panel: Recent Labs  Lab 11/29/23 1335 11/30/23 0410 12/01/23 0431 12/02/23 0438 12/03/23 0818 12/05/23 0747  NA  --  137 138 137 136 137  K  --  4.1 3.4* 4.3 3.6 3.9  CL  --  110 113* 112* 108 109  CO2  --  19* 17* 18* 19* 19*  GLUCOSE  --  108* 98  120* 127* 99  BUN  --  32* 32* 24* 26* 26*  CREATININE  --  2.30* 2.01* 1.67* 2.11* 1.87*  CALCIUM  --  9.5 9.4 9.4 9.6 9.6  MG 2.1  --   --  2.0  --   --   PHOS 3.4  --   --   --   --   --    GFR: Estimated Creatinine Clearance: 44.9 mL/min (A) (by C-G formula based on SCr of 1.87 mg/dL (H)). Liver Function Tests: No results for input(s): "AST", "ALT", "ALKPHOS", "BILITOT", "PROT", "ALBUMIN" in the last 168 hours. No results for input(s): "LIPASE", "AMYLASE" in the last 168 hours. No results for input(s): "AMMONIA" in the last 168 hours. Coagulation Profile: No results for input(s): "INR", "PROTIME" in the last 168 hours. Cardiac Enzymes: No results for input(s): "CKTOTAL", "CKMB", "CKMBINDEX", "TROPONINI" in the last 168 hours. BNP (last 3 results) No results for input(s): "PROBNP" in the last 8760 hours. HbA1C: No results for input(s): "HGBA1C" in the last 72 hours.  CBG: Recent Labs  Lab 12/04/23 1140 12/04/23 1653 12/04/23 2120 12/05/23 0744 12/05/23 1119  GLUCAP 74 102* 103* 86 86   Lipid Profile: No results for input(s): "CHOL", "HDL", "LDLCALC", "TRIG", "CHOLHDL", "LDLDIRECT" in the last 72 hours. Thyroid Function Tests: No results for input(s): "TSH", "T4TOTAL", "FREET4", "T3FREE", "THYROIDAB" in the last 72 hours.  Anemia Panel: No results for input(s): "VITAMINB12", "FOLATE", "FERRITIN", "TIBC", "IRON", "RETICCTPCT" in the last 72 hours.  Sepsis Labs: No results for input(s): "PROCALCITON", "LATICACIDVEN" in the last 168 hours.  No results found for this or any previous visit (from the past 240 hours).       Radiology Studies: No results found.       Scheduled Meds:  apixaban  5 mg Oral BID   droxidopa  200 mg Oral TID WC   escitalopram  10 mg Oral Daily   feeding supplement  237 mL Oral BID BM   fludrocortisone  0.2 mg Oral Daily   insulin aspart  0-15 Units Subcutaneous TID WC   midodrine  10 mg Oral TID WC   risperidone  4 mg Oral QHS    trihexyphenidyl  2 mg Oral BID   Continuous Infusions:     LOS: 5 days    Time spent: 50 minutes spent on chart review, discussion with nursing staff, consultants, updating family and interview/physical exam; more than 50% of that time was spent in counseling and/or coordination of care.    Alvira Philips [place], DO [facility] Available via Epic secure chat 7am-7pm After these hours, please refer to coverage provider listed on [email or web address] 12/05/2023, 12:06 PM

## 2023-12-05 NOTE — Progress Notes (Signed)
Mobility Specialist: Progress Note   12/05/23 1205  Mobility  Activity Ambulated with assistance in hallway  Level of Assistance Contact guard assist, steadying assist  Assistive Device Front wheel walker  Distance Ambulated (ft) 80 ft  Activity Response Tolerated well  Mobility Referral Yes  Mobility visit 1 Mobility  Mobility Specialist Start Time (ACUTE ONLY) 0954  Mobility Specialist Stop Time (ACUTE ONLY) 1014  Mobility Specialist Time Calculation (min) (ACUTE ONLY) 20 min    Pt was agreeable to mobility session - received in bed. Slightly impulsive today. Completed orthostatics (see following note) and hallway ambulation. Got dizzy at EOS and needed to sit while taking 3 min standing BP. Left in chair with all needs met, call bell in reach. Chair alarm on.   Maurene Capes Mobility Specialist Please contact via SecureChat or Rehab office at [phone]

## 2023-12-05 NOTE — TOC Progression Note (Addendum)
Transition of Care [facility]) - Progression Note    Patient Details  Name: Michael Mcknight MRN: [id number] Date of Birth: 04-19-1966  [facility]) CM/SW Contact  Deatra Robinson, [place] Phone Number: 12/05/2023, 1:47 PM  Clinical Narrative: Call received from [facility] with [employer] who reports pt has been approved for SNF at [facility] [phone], approved for 7 days) and ambulance transport with [facility] [id number]). Pt has not used CPAP this admission, noncompliant. Per MD, pt will not need to dc to SNF with CPAP given refusal to use.   Message left for Kia in [facility] admissions with auth information and request for admission today, await response.    UPDATE 1455:[facility] is able to accept pt tomorrow. MD updated.   Dellie Burns, MSW, LCSW [phone] (coverage)      Expected Discharge Plan: Skilled Nursing Facility Barriers to Discharge: No Barriers Identified  Expected Discharge Plan and Services       Living arrangements for the past 2 months: Single Family Home                                       Social Determinants of Health (SDOH) Interventions SDOH Screenings   Food Insecurity: No Food Insecurity (11/30/2023)  Housing: Low Risk  (11/30/2023)  Transportation Needs: No Transportation Needs (11/30/2023)  Utilities: Not At Risk (11/30/2023)  Tobacco Use: Low Risk  (11/15/2023)    Readmission Risk Interventions    11/30/2023    5:09 PM  Readmission Risk Prevention Plan  Post Dischage Appt Complete  Medication Screening Complete  Transportation Screening Complete

## 2023-12-06 DIAGNOSIS — I951 Orthostatic hypotension: Secondary | ICD-10-CM | POA: Diagnosis not present

## 2023-12-06 LAB — GLUCOSE, CAPILLARY
Glucose-Capillary: 88 mg/dL (ref 70–99)
Glucose-Capillary: 87 mg/dL (ref 70–99)

## 2023-12-06 MED ORDER — FLUDROCORTISONE ACETATE 0.1 MG PO TABS: 0.2000 mg | ORAL_TABLET | Freq: Every day | ORAL | 0 refills | Status: AC

## 2023-12-06 MED ORDER — DROXIDOPA 200 MG PO CAPS: 200.0000 mg | ORAL_CAPSULE | Freq: Three times a day (TID) | ORAL | 0 refills | Status: DC

## 2023-12-06 MED ORDER — MIDODRINE HCL 10 MG PO TABS: 10.0000 mg | ORAL_TABLET | Freq: Three times a day (TID) | ORAL | 0 refills | Status: AC

## 2023-12-06 MED ORDER — RISPERIDONE 4 MG PO TABS: 4.0000 mg | ORAL_TABLET | Freq: Every day | ORAL | 0 refills | Status: DC

## 2023-12-06 MED ORDER — TRIHEXYPHENIDYL HCL 2 MG PO TABS: 2.0000 mg | ORAL_TABLET | Freq: Two times a day (BID) | ORAL | 0 refills | Status: DC

## 2023-12-06 NOTE — Discharge Summary (Signed)
Physician Discharge Summary  Michael Mcknight:[id number] DOB: 12/24/1965 DOA: 11/29/2023  PCP: Ellis Parents, FNP  Admit date: 11/29/2023 Discharge date: 12/06/2023  Admitted From: Home Disposition: SNF  Recommendations for Outpatient Follow-up:  Follow up with PCP in 1-2 weeks Follow-up with cardiology Dr. Dulce Sellar in 2-3 weeks Florinef increased to 0.2 mg p.o. daily Droxidopa increased to 200 mg p.o. TID Continue to monitor orthostatic vital signs, utilize thigh-high compression stockings Please obtain BMP in one week to follow renal function   Discharge Condition: Stable CODE STATUS: Full code Diet recommendation: Heart healthy/consistent carbohydrate diet  History of present illness:  Michael Mcknight is a 58 y.o. male with past medical history significant for A-fib/flutter on Eliquis, HTN, CKD stage IV, chronic systolic congestive heart failure, orthostatic hypotension with dysautonomia, schizophrenia, autism spectrum disorder, OSA who presented to [facility] ED on 1/13 via EMS for complaints of chest pain, dizziness. Per sister, patient developed orthostatic hypotension with severe postural dizziness in August 2024. This is being managed by his PCP and cardiology. Patient continues to be dizzy even with changes to his medications and has had multiple falls secondary to the dizziness. This morning, patient was scheduled to get MRI (unsure what type of MRI) ordered by his PCP. They sat patient in the chair and when he tried to get up, he started having significant dizziness, waving his hands in the air so they called EMS. Patient reports that he has been having dizzy spells almost every day mostly when he stands up or sometimes even while laying down. He describes the spells as lightheadedness however according to sister he does have episodes that looks like the room spinning. He denies any chest pain, shortness of breath, palpitations, headaches or vision changes.   On EMS arrival,  patient reported some chest discomfort so he was given ASA 325 mg x 1. He was found to be orthostatic with drop in his SBP to the 90s upon standing.  12-lead was unremarkable.   In the ED, temperature 98.2 F, HR 66, RR 17, BP 136/88, SpO2 100% on room air.  Orthostatic vital signs positive in the ED with SBP dropping from 147 to 95.  WBC 11.8, hemoglobin 10.0, platelet count 628.  Sodium 135, potassium 4.2, chloride 105, CO2 16, glucose 106, BUN 34, creatinine 2.22.  BNP 64.5.  High sensitive troponin 8> 8.  Chest x-ray with no acute cardiopulmonary process.  FOBT negative.  TSH 1.372.  Random cortisol 16.6.  VBG with pH 7.46, pCO2 22.2, PaO2 64, bicarb 16.7.  MRI brain with no acute finding, moderate spinal canal stenosis C 3-4 level.  EKG with sinus rhythm, PACs and RBBB.  Patient unable to ambulate due to his severe postural dizziness.  TRH consulted for admission.  Hospital course:  Orthostatic hypotension with associated dysautonomia Patient with history of persistent orthostatic hypotension here for evaluation after had a severe postural dizziness this morning prior to going for an MRI. He is significantly orthostatic positive on admission with difficulty getting up due to the dizziness. SBP dropped from 147 lying down to 100 upon standing then 108 upon standing for 3. On chart review, during patient's ER visit at [facility] in November, a CT head showed "Soft tissue attenuation adjacent in the bilateral Prussak space, right greater than left, which could represent cholesteatomas. This can be an etiology of dizziness. Further evaluation with nonemergent/outpatient MRI is recommended." He was seen by his cardiology in November and was started on Florinef. He followed up  with cardiology 2 weeks ago due to persistent postural dizziness and his midodrine was increased to 10 mg with plan to increase his droxidopa to 200 mg if symptoms do not improve.  MR brain without contrast with no acute findings.   Cortisol level and TSH within normal limits.  Droxidopa increased to 200 mg p.o. 3 times daily, Florinef increased to 0.2 mg p.o. daily.  Continue midodrine 10 mg p.o. 3 times daily.  Continue thigh-high TED hose.  Discharging to SNF.  Outpatient follow-up with PCP/cardiology.   Hypokalemia Repleated   Chronic diastolic congestive heart failure Hx HFrecHF Patient with previous decreased LV function with a EF 30-35% November 2020, now increased to 55-6% on TTE October 2023.  Repeat TTE this hospitalization with LVEF 55-60%, no LV regional wall motion maladies, LV diastolic parameters indeterminate, small pericardial effusion, no aortic stenosis, IVC normal in size.   History of paroxysmal atrial fibrillation/flutter History of right bundle branch block EKG notable for sinus rhythm, with PACs and RBBB. Eliquis 5 mg PO BID   Type 2 diabetes mellitus Patient was recently discontinued off of insulin after losing significant amount of weight.  Hemoglobin A1c 8.1.  Continue to monitor glucose intermittently.  Diet controlled.   CKD stage IV Creatinine 2.22 on admission, recently 2.14; three weeks prior.  Creatinine improved to 1.87 at time of discharge.  Recommend repeat BMP 1 week.   Anxiety/depression: Lexapro 10 mg p.o. daily   Schizophrenia Patient currently on  risperidone and trihexyphenidyl. Per sister, he has been on these medications for years and they preceded his orthostatic hypotension.  This meds are known causes of orthostatic hypotension. If the etiology of his orthostatic hypotension are not found, he will likely need to be weaned off slowly in the outpatient. Continue on risperidone and trihexyphenidyl for now. Outpatient follow-up with behavioral health   OSA noncompliant   Weakness/debility/deconditioning: Discharging to SNF  Discharge Diagnoses:  Principal Problem:   Orthostatic hypotension Active Problems:   Postural dizziness with presyncope   Orthostatic  hypotension dysautonomic syndrome    Discharge Instructions  Discharge Instructions     Call MD for:  difficulty breathing, headache or visual disturbances   Complete by: As directed    Call MD for:  extreme fatigue   Complete by: As directed    Call MD for:  persistant dizziness or light-headedness   Complete by: As directed    Call MD for:  persistant nausea and vomiting   Complete by: As directed    Call MD for:  severe uncontrolled pain   Complete by: As directed    Call MD for:  temperature >100.4   Complete by: As directed    Diet - low sodium heart healthy   Complete by: As directed    Increase activity slowly   Complete by: As directed       Allergies as of 12/06/2023       Reactions   Codeine Nausea And Vomiting        Medication List     STOP taking these medications    ALPRAZolam 0.5 MG tablet Commonly known as: XANAX   insulin glargine 100 UNIT/ML injection Commonly known as: LANTUS       TAKE these medications    acetaminophen 500 MG tablet Commonly known as: TYLENOL Take 1,000 mg by mouth every 6 (six) hours as needed for moderate pain or headache.   Droxidopa 200 MG Caps Take 200 mg by mouth 3 (three) times daily with  meals. What changed:  medication strength how much to take   Eliquis 5 MG Tabs tablet Generic drug: apixaban TAKE ONE TABLET BY MOUTH EVERY DAY and TAKE ONE TABLET BY MOUTH AT BEDTIME What changed: See the new instructions.   escitalopram 10 MG tablet Commonly known as: LEXAPRO Take 10 mg by mouth daily.   fludrocortisone 0.1 MG tablet Commonly known as: FLORINEF Take 2 tablets (0.2 mg total) by mouth daily. What changed: how much to take   midodrine 10 MG tablet Commonly known as: PROAMATINE Take 1 tablet (10 mg total) by mouth 3 (three) times daily with meals.   risperidone 4 MG tablet Commonly known as: RISPERDAL Take 1 tablet (4 mg total) by mouth at bedtime.   trihexyphenidyl 2 MG tablet Commonly  known as: ARTANE Take 1 tablet (2 mg total) by mouth 2 (two) times daily.               Durable Medical Equipment  (From admission, onward)           Start     Ordered   12/01/23 0733  For home use only DME 4 wheeled rolling walker with seat  Once       Question:  Patient needs a walker to treat with the following condition  Answer:  Gait abnormality   12/01/23 0732   11/30/23 1716  For home use only DME 4 wheeled rolling walker with seat  Once       Question:  Patient needs a walker to treat with the following condition  Answer:  Gait instability   11/30/23 1715            Contact information for follow-up providers     Ellis Parents, FNP. Schedule an appointment as soon as possible for a visit in 1 week(s).   Specialty: Nurse Practitioner Contact information: [address] [place] [address] [phone]         Baldo Daub, MD. Schedule an appointment as soon as possible for a visit.   Specialties: Cardiology, Radiology Contact information: [address] [place] [address] [phone]              Contact information for after-discharge care     Destination     [facility] Preferred SNF .   Service: Skilled Nursing Contact information: [address] [place] [address] [phone]                    Allergies  Allergen Reactions   Codeine Nausea And Vomiting    Consultations: None   Procedures/Studies: ECHOCARDIOGRAM COMPLETE Result Date: 11/30/2023    ECHOCARDIOGRAM REPORT   Patient Name:   REA PARCHEM Christoph Date of Exam: 11/30/2023 Medical Rec #:  [id number]        Height:       66.0 in Accession #:    [id number]       Weight:       185.6 lb Date of Birth:  May 28, 1966        BSA:          1.937 m Patient Age:    57 years         BP:           111/84 mmHg Patient Gender: M                HR:           90 bpm. Exam Location:  Inpatient Procedure:  2D Echo, Cardiac Doppler and Color  Doppler Indications:    Dyspna, A-fib  History:        Patient has prior history of Echocardiogram examinations, most                 recent 08/27/2022. Cardiomegaly and Cardiomyopathy;                 Arrythmias:Atrial Fibrillation and Atrial Flutter.  Sonographer:    Vern Claude Referring Phys: [id number] PROSPER M AMPONSAH IMPRESSIONS  1. Left ventricular ejection fraction, by estimation, is 55 to 60%. The left ventricle has normal function. The left ventricle has no regional wall motion abnormalities. Left ventricular diastolic parameters are indeterminate.  2. Right ventricular systolic function is normal. The right ventricular size is normal. Tricuspid regurgitation signal is inadequate for assessing PA pressure.  3. A small pericardial effusion is present. The pericardial effusion is posterior to the left ventricle.  4. The mitral valve is grossly normal. Trivial mitral valve regurgitation. No evidence of mitral stenosis.  5. The aortic valve was not well visualized. Aortic valve regurgitation is not visualized. No aortic stenosis is present.  6. The inferior vena cava is normal in size with greater than 50% respiratory variability, suggesting right atrial pressure of 3 mmHg. Comparison(s): No significant change from prior study. Prior images reviewed side by side. FINDINGS  Left Ventricle: Left ventricular ejection fraction, by estimation, is 55 to 60%. The left ventricle has normal function. The left ventricle has no regional wall motion abnormalities. The left ventricular internal cavity size was normal in size. There is  no left ventricular hypertrophy. Left ventricular diastolic parameters are indeterminate. Right Ventricle: The right ventricular size is normal. No increase in right ventricular wall thickness. Right ventricular systolic function is normal. Tricuspid regurgitation signal is inadequate for assessing PA pressure. Left Atrium: Left atrial size was normal in size. Right Atrium: Right atrial  size was normal in size. Pericardium: A small pericardial effusion is present. The pericardial effusion is posterior to the left ventricle. Presence of epicardial fat layer. Mitral Valve: The mitral valve is grossly normal. Trivial mitral valve regurgitation. No evidence of mitral valve stenosis. MV peak gradient, 1.1 mmHg. The mean mitral valve gradient is 1.0 mmHg. Tricuspid Valve: The tricuspid valve is normal in structure. Tricuspid valve regurgitation is not demonstrated. No evidence of tricuspid stenosis. Aortic Valve: The aortic valve was not well visualized. Aortic valve regurgitation is not visualized. No aortic stenosis is present. Aortic valve mean gradient measures 2.3 mmHg. Aortic valve peak gradient measures 3.7 mmHg. Aortic valve area, by VTI measures 3.28 cm. Pulmonic Valve: The pulmonic valve was normal in structure. Pulmonic valve regurgitation is not visualized. No evidence of pulmonic stenosis. Aorta: The aortic root and ascending aorta are structurally normal, with no evidence of dilitation. Venous: The inferior vena cava is normal in size with greater than 50% respiratory variability, suggesting right atrial pressure of 3 mmHg. IAS/Shunts: The atrial septum is grossly normal.  LEFT VENTRICLE PLAX 2D LVIDd:         5.40 cm      Diastology LVIDs:         3.20 cm      LV e' medial:    5.33 cm/s LV PW:         0.80 cm      LV E/e' medial:  10.8 LV IVS:        0.70 cm      LV e' lateral:  5.98 cm/s LVOT diam:     2.00 cm      LV E/e' lateral: 9.6 LV SV:         50 LV SV Index:   26 LVOT Area:     3.14 cm  LV Volumes (MOD) LV vol d, MOD A2C: 108.0 ml LV vol d, MOD A4C: 145.0 ml LV vol s, MOD A2C: 52.3 ml LV vol s, MOD A4C: 59.6 ml LV SV MOD A2C:     55.7 ml LV SV MOD A4C:     145.0 ml LV SV MOD BP:      71.0 ml RIGHT VENTRICLE             IVC RV S prime:     15.80 cm/s  IVC diam: 1.00 cm TAPSE (M-mode): 2.0 cm LEFT ATRIUM           Index LA diam:      3.00 cm 1.55 cm/m LA Vol (A2C): 32.3 ml 16.67  ml/m LA Vol (A4C): 42.3 ml 21.83 ml/m  AORTIC VALVE                    PULMONIC VALVE AV Area (Vmax):    3.30 cm     PV Vmax:       0.78 m/s AV Area (Vmean):   2.95 cm     PV Peak grad:  2.4 mmHg AV Area (VTI):     3.28 cm AV Vmax:           96.07 cm/s AV Vmean:          68.500 cm/s AV VTI:            0.153 m AV Peak Grad:      3.7 mmHg AV Mean Grad:      2.3 mmHg LVOT Vmax:         101.00 cm/s LVOT Vmean:        64.400 cm/s LVOT VTI:          0.160 m LVOT/AV VTI ratio: 1.04  AORTA Ao Root diam: 3.30 cm Ao Asc diam:  3.20 cm MITRAL VALVE MV Area (PHT): 3.03 cm    SHUNTS MV Area VTI:   4.65 cm    Systemic VTI:  0.16 m MV Peak grad:  1.1 mmHg    Systemic Diam: 2.00 cm MV Mean grad:  1.0 mmHg MV Vmax:       0.52 m/s MV Vmean:      34.6 cm/s MV Decel Time: 250 msec MV E velocity: 57.30 cm/s MV A velocity: 70.80 cm/s MV E/A ratio:  0.81 Riley Lam MD Electronically signed by Riley Lam MD Signature Date/Time: 11/30/2023/11:04:24 AM    Final    MR BRAIN W WO CONTRAST Result Date: 11/30/2023 CLINICAL DATA:  Dizziness EXAM: MRI HEAD WITHOUT AND WITH CONTRAST TECHNIQUE: Multiplanar, multiecho pulse sequences of the brain and surrounding structures were obtained without and with intravenous contrast. CONTRAST:  8mL GADAVIST GADOBUTROL 1 MMOL/ML IV SOLN COMPARISON:  None Available. FINDINGS: Brain: No acute infarct, mass effect or extra-axial collection. No acute or chronic hemorrhage. Normal white matter signal, parenchymal volume and CSF spaces. The midline structures are normal. There is no abnormal contrast enhancement. Vascular: Normal flow voids. Skull and upper cervical spine: Moderate spinal canal stenosis at the C3-4 level. Sinuses/Orbits:No paranasal sinus fluid levels or advanced mucosal thickening. No mastoid or middle ear effusion. Normal orbits. IMPRESSION: 1. Normal MRI of the brain. 2. Moderate spinal canal  stenosis at the C3-4 level. Electronically Signed   By: Deatra Robinson M.D.    On: 11/30/2023 01:56   DG Chest Port 1 View Result Date: 11/29/2023 CLINICAL DATA:  Chest pain EXAM: PORTABLE CHEST 1 VIEW COMPARISON:  X-ray 09/19/2021. FINDINGS: No consolidation, pneumothorax or effusion. Normal cardiopericardial silhouette without edema. Degenerative changes along the spine. Overlapping cardiac leads. IMPRESSION: No acute cardiopulmonary disease. Electronically Signed   By: Karen Kays M.D.   On: 11/29/2023 10:07     Subjective: Patient seen examined bedside, resting calmly.  Lying in bed.  No complaints this morning watching TV.  Denies any further dizziness.  Discharging to SNF today.  Further denies headache, no visual changes, no chest pain, no shortness of breath, no abdominal pain, no fever, no focal weakness, no cough/congestion, no nausea/vomiting/diarrhea, no paresthesias.  No acute events overnight per nursing staff.  Discharge Exam: Vitals:   12/06/23 0517 12/06/23 0752  BP: 114/64 (!) 87/54  Pulse: 70 94  Resp: 18   Temp: 98.2 F (36.8 C) 97.6 F (36.4 C)  SpO2: 99% 100%   Vitals:   12/05/23 1500 12/05/23 2057 12/06/23 0517 12/06/23 0752  BP: (!) 158/74 (!) 159/64 114/64 (!) 87/54  Pulse: 65 69 70 94  Resp: 18 18 18    Temp: 98.1 F (36.7 C) 98.4 F (36.9 C) 98.2 F (36.8 C) 97.6 F (36.4 C)  TempSrc: Oral Oral Oral Oral  SpO2: 98% 100% 99% 100%  Weight:      Height:        Physical Exam: GEN: NAD, alert HEENT: NCAT, PERRL, EOMI, sclera clear, MMM PULM: CTAB w/o wheezes/crackles, normal respiratory effort, on room air CV: RRR w/o M/G/R GI: abd soft, NTND, NABS, no R/G/M MSK: no peripheral edema, moves all extremities independently NEURO: No focal neurological deficits PSYCH: normal mood/affect Integumentary: No concerning rashes/lesions/wounds noted on exposed skin surface    The results of significant diagnostics from this hospitalization (including imaging, microbiology, ancillary and laboratory) are listed below for reference.      Microbiology: No results found for this or any previous visit (from the past 240 hours).   Labs: BNP (last 3 results) Recent Labs    11/29/23 0852  BNP 64.5   Basic Metabolic Panel: Recent Labs  Lab 11/29/23 1335 11/30/23 0410 12/01/23 0431 12/02/23 0438 12/03/23 0818 12/05/23 0747  NA  --  137 138 137 136 137  K  --  4.1 3.4* 4.3 3.6 3.9  CL  --  110 113* 112* 108 109  CO2  --  19* 17* 18* 19* 19*  GLUCOSE  --  108* 98 120* 127* 99  BUN  --  32* 32* 24* 26* 26*  CREATININE  --  2.30* 2.01* 1.67* 2.11* 1.87*  CALCIUM  --  9.5 9.4 9.4 9.6 9.6  MG 2.1  --   --  2.0  --   --   PHOS 3.4  --   --   --   --   --    Liver Function Tests: No results for input(s): "AST", "ALT", "ALKPHOS", "BILITOT", "PROT", "ALBUMIN" in the last 168 hours. No results for input(s): "LIPASE", "AMYLASE" in the last 168 hours. No results for input(s): "AMMONIA" in the last 168 hours. CBC: Recent Labs  Lab 11/29/23 0951 11/29/23 1041 11/30/23 0410 12/01/23 0431 12/03/23 0818 12/05/23 0747  WBC 11.8*  --  12.1* 11.2* 11.5* 9.7  HGB 10.0* 9.5* 9.4* 9.5* 8.9* 9.1*  HCT 30.5* 28.0* 28.6* 28.5*  27.7* 28.2*  MCV 85.4  --  86.1 84.8 86.6 86.2  PLT 628*  --  600* 569* 645* 639*   Cardiac Enzymes: No results for input(s): "CKTOTAL", "CKMB", "CKMBINDEX", "TROPONINI" in the last 168 hours. BNP: Invalid input(s): "POCBNP" CBG: Recent Labs  Lab 12/05/23 0744 12/05/23 1119 12/05/23 1558 12/05/23 2040 12/06/23 0752  GLUCAP 86 86 72 99 88   D-Dimer No results for input(s): "DDIMER" in the last 72 hours. Hgb A1c No results for input(s): "HGBA1C" in the last 72 hours. Lipid Profile No results for input(s): "CHOL", "HDL", "LDLCALC", "TRIG", "CHOLHDL", "LDLDIRECT" in the last 72 hours. Thyroid function studies No results for input(s): "TSH", "T4TOTAL", "T3FREE", "THYROIDAB" in the last 72 hours.  Invalid input(s): "FREET3" Anemia work up No results for input(s): "VITAMINB12", "FOLATE",  "FERRITIN", "TIBC", "IRON", "RETICCTPCT" in the last 72 hours. Urinalysis    Component Value Date/Time   COLORURINE YELLOW (A) 11/02/2023 1155   APPEARANCEUR HAZY (A) 11/02/2023 1155   LABSPEC 1.014 11/02/2023 1155   PHURINE 5.0 11/02/2023 1155   GLUCOSEU NEGATIVE 11/02/2023 1155   HGBUR MODERATE (A) 11/02/2023 1155   BILIRUBINUR NEGATIVE 11/02/2023 1155   KETONESUR NEGATIVE 11/02/2023 1155   PROTEINUR 30 (A) 11/02/2023 1155   NITRITE NEGATIVE 11/02/2023 1155   LEUKOCYTESUR NEGATIVE 11/02/2023 1155   Sepsis Labs Recent Labs  Lab 11/30/23 0410 12/01/23 0431 12/03/23 0818 12/05/23 0747  WBC 12.1* 11.2* 11.5* 9.7   Microbiology No results found for this or any previous visit (from the past 240 hours).   Time coordinating discharge: Over 30 minutes  SIGNED:   Alvira Philips [place], DO  [facility] 12/06/2023, 8:22 AM

## 2023-12-06 NOTE — [place] FL2 (Signed)
[place] MEDICAID FL2 LEVEL OF CARE FORM     IDENTIFICATION  Patient Name: Michael Mcknight Birthdate: 19-Nov-1965 Sex: male Admission Date (Current Location): 11/29/2023  [facility] and [place] Number:  Producer, television/film/video and Address:  The [place]. [facility], [address], [place], [place] [address]      Provider Number: [id number]  Attending Physician Name and Address:  [place], Eric J, DO  Relative Name and Phone Number:  Leory Plowman (Sister)  [phone] [facility])    Current Level of Care: Hospital Recommended Level of Care: Skilled Nursing Facility Prior Approval Number:    Date Approved/Denied:   PASRR Number: [id number]  Discharge Plan: SNF    Current Diagnoses: Patient Active Problem List   Diagnosis Date Noted   Orthostatic hypotension dysautonomic syndrome 11/30/2023   Postural dizziness with presyncope 11/29/2023   Acute kidney injury superimposed on CKD (HCC) 09/20/2021   Orthostatic hypotension 09/20/2021   Near syncope 09/20/2021   AF (paroxysmal atrial fibrillation) (HCC) 09/20/2021   Prolonged QT interval 09/20/2021   AKI (acute kidney injury) (HCC) 09/20/2021   Cyst of left kidney 10/04/2020   Cardiorenal syndrome with renal failure 06/26/2020   Chronic systolic congestive heart failure (HCC) 06/26/2020   Obesity (BMI 30-39.9) 06/26/2020   CKD (chronic kidney disease) stage 4, GFR 15-29 ml/min (HCC) 06/26/2020   Dilated cardiomyopathy (HCC) ejection fraction 35% in November 2020 10/23/2019   Congestive heart failure due to cardiomyopathy (HCC) New York Heart Association class II 10/23/2019   Atypical atrial flutter (HCC)    CKD (chronic kidney disease), stage III (HCC) 09/22/2019   Schizophrenia (HCC) 09/22/2019   Cardiomegaly 09/22/2019   Pleural effusion 09/22/2019   Lobar pneumonia (HCC) 09/22/2019   Benign essential HTN 09/22/2019   Atrial fibrillation/flutter (HCC) 09/22/2019    Orientation RESPIRATION BLADDER Height &  Weight     Self, Situation, Place, Time  Normal Continent Weight: 185 lb 13.6 oz (84.3 kg) Height:  5' 5.98" (167.6 cm)  BEHAVIORAL SYMPTOMS/MOOD NEUROLOGICAL BOWEL NUTRITION STATUS      Continent Diet (see d/c summary)  AMBULATORY STATUS COMMUNICATION OF NEEDS Skin   Extensive Assist Verbally Normal                       Personal Care Assistance Level of Assistance  Bathing, Dressing, Feeding Bathing Assistance: Limited assistance Feeding assistance: Independent Dressing Assistance: Limited assistance     Functional Limitations Info  Sight, Speech, Hearing Sight Info: Adequate Hearing Info: Adequate Speech Info: Adequate    SPECIAL CARE FACTORS FREQUENCY  OT (By licensed OT), PT (By licensed PT)     PT Frequency: 5x/week OT Frequency: 5x/week            Contractures Contractures Info: Not present    Additional Factors Info  Code Status, Allergies Code Status Info: Full code Allergies Info: Codeine           Current Medications (12/06/2023):  This is the current hospital active medication list Current Facility-Administered Medications  Medication Dose Route Frequency Provider Last Rate Last Admin   acetaminophen (TYLENOL) tablet 650 mg  650 mg Oral Q6H PRN Steffanie Rainwater, MD       Or   acetaminophen (TYLENOL) suppository 650 mg  650 mg Rectal Q6H PRN Steffanie Rainwater, MD       apixaban Everlene Balls) tablet 5 mg  5 mg Oral BID Steffanie Rainwater, MD   5 mg at 12/05/23 2206   droxidopa (  NORTHERA) capsule 200 mg  200 mg Oral TID WC [place], Alvira Philips, DO   200 mg at 12/06/23 0745   escitalopram (LEXAPRO) tablet 10 mg  10 mg Oral Daily Steffanie Rainwater, MD   10 mg at 12/05/23 0929   feeding supplement (ENSURE ENLIVE / ENSURE PLUS) liquid 237 mL  237 mL Oral BID BM [place], Eric J, DO   237 mL at 12/05/23 1434   fludrocortisone (FLORINEF) tablet 0.2 mg  0.2 mg Oral Daily Steffanie Rainwater, MD   0.2 mg at 12/05/23 0930   insulin aspart (novoLOG) injection  0-15 Units  0-15 Units Subcutaneous TID WC Steffanie Rainwater, MD       midodrine (PROAMATINE) tablet 10 mg  10 mg Oral TID WC Steffanie Rainwater, MD   10 mg at 12/06/23 0745   ondansetron (ZOFRAN) tablet 4 mg  4 mg Oral Q6H PRN Steffanie Rainwater, MD       Or   ondansetron [facility]) injection 4 mg  4 mg Intravenous Q6H PRN Steffanie Rainwater, MD       Oral care mouth rinse  15 mL Mouth Rinse PRN [place], Eric J, DO       risperiDONE (RISPERDAL) tablet 4 mg  4 mg Oral QHS Steffanie Rainwater, MD   4 mg at 12/05/23 2206   senna-docusate (Senokot-S) tablet 1 tablet  1 tablet Oral QHS PRN Steffanie Rainwater, MD       trihexyphenidyl (ARTANE) tablet 2 mg  2 mg Oral BID Steffanie Rainwater, MD   2 mg at 12/05/23 2206     Discharge Medications: Please see discharge summary for a list of discharge medications.  Relevant Imaging Results:  Relevant Lab Results:   Additional Information SSN [id number] [place], [place]

## 2023-12-06 NOTE — TOC Transition Note (Signed)
Transition of Care [facility]) - Discharge Note   Patient Details  Name: Michael Mcknight MRN: [id number] Date of Birth: 04/17/1966  [facility]) CM/SW Contact:  Michaela Corner, LCSWA Phone Number: 12/06/2023, 11:32 AM   Clinical Narrative:   Patient will DC to: [facility] Anticipated DC date: 12/06/2023 Family notified: Darlene (sis) Transport by: Sharin Mons   Per MD patient ready for DC to [facility]. RN to call report prior to discharge ([phone]; room 126a). RN, patient, patient's family, and facility notified of DC. Discharge Summary and FL2 sent to facility. DC packet on chart. Ambulance transport requested for patient.   CSW will sign off for now as social work intervention is no longer needed. Please consult [place] again if new needs arise.      Final next level of care: Skilled Nursing Facility Barriers to Discharge: No Barriers Identified   Patient Goals and CMS Choice            Discharge Placement              Patient chooses bed at: [facility] Patient to be transferred to facility by: [facility] Name of family member notified: Darlene - sis Patient and family notified of of transfer: 12/06/23  Discharge Plan and Services Additional resources added to the After Visit Summary for                                       Social Drivers of Health (SDOH) Interventions SDOH Screenings   Food Insecurity: No Food Insecurity (11/30/2023)  Housing: Low Risk  (11/30/2023)  Transportation Needs: No Transportation Needs (11/30/2023)  Utilities: Not At Risk (11/30/2023)  Tobacco Use: Low Risk  (11/15/2023)     Readmission Risk Interventions    11/30/2023    5:09 PM  Readmission Risk Prevention Plan  Post Dischage Appt Complete  Medication Screening Complete  Transportation Screening Complete

## 2023-12-06 NOTE — Progress Notes (Signed)
Mobility Specialist Progress Note:   12/06/23 0900  Orthostatic Lying   BP- Lying 122/66  Orthostatic Sitting  BP- Sitting 108/58  Orthostatic Standing at 0 minutes  BP- Standing at 0 minutes (!) 72/51  Mobility  Activity Ambulated with assistance in hallway;Ambulated with assistance in room  Level of Assistance Contact guard assist, steadying assist  Assistive Device Front wheel walker  Distance Ambulated (ft) 60 ft  Activity Response Tolerated fair;Tolerated well  Mobility Referral Yes  Mobility visit 1 Mobility  Mobility Specialist Start Time (ACUTE ONLY) [id number]  Mobility Specialist Stop Time (ACUTE ONLY) 0910  Mobility Specialist Time Calculation (min) (ACUTE ONLY) 18 min   Pt received in bed and agreeable. Orthostatic vitals taken, see above. Asymptomatic throughout w/ pt denying any dizziness or lightheadedness. Returned to room w/o fault. Pt left in chair with call bell and all needs met.  [place] Earlene Plater Mobility Specialist Please contact via Special educational needs teacher or Rehab office at [phone]

## 2023-12-06 NOTE — TOC Progression Note (Signed)
Transition of Care [facility]) - Progression Note    Patient Details  Name: Michael Mcknight MRN: [id number] Date of Birth: [birth year]  [facility] of [facility]) CM/SW Contact  Michaela Corner, [place] Phone Number: 12/06/2023, 11:13 AM  Clinical Narrative:   PASRR # [id number] ; Approval dates 12/01/2023 12/31/2023    Expected Discharge Plan: Skilled Nursing Facility Barriers to Discharge: No Barriers Identified  Expected Discharge Plan and Services       Living arrangements for the past 2 months: Single Family Home Expected Discharge Date: 12/06/23                                     Social Determinants of Health (SDOH) Interventions SDOH Screenings   Food Insecurity: No Food Insecurity (11/30/2023)  Housing: Low Risk  (11/30/2023)  Transportation Needs: No Transportation Needs (11/30/2023)  Utilities: Not At Risk (11/30/2023)  Tobacco Use: Low Risk  (11/15/2023)    Readmission Risk Interventions    11/30/2023    5:09 PM  Readmission Risk Prevention Plan  Post Dischage Appt Complete  Medication Screening Complete  Transportation Screening Complete

## 2023-12-06 NOTE — Plan of Care (Signed)
  Problem: Elimination: Goal: Will not experience complications related to bowel motility Outcome: Progressing   Problem: Pain Management: Goal: General experience of comfort will improve Outcome: Progressing

## 2023-12-27 NOTE — Progress Notes (Signed)
Cardiology Office Note:    Date:  12/28/2023   ID:  Michael Mcknight, DOB May 16, 1966, MRN [id number]  PCP:  Paulina Fusi, MD  Cardiologist:  Norman Herrlich, MD    Referring MD: Ellis Parents, FNP    ASSESSMENT:    1. Orthostatic hypotension   2. Hypertensive heart and kidney disease with chronic diastolic congestive heart failure and stage 4 chronic kidney disease (HCC)   3. Paroxysmal atrial fibrillation (HCC)    PLAN:    In order of problems listed above:  Improved with intensive treatment to support his blood pressure including Florinef midodrine and droxidopa Increase the droxidopa to at least twice daily Continue the abdominal binder and start using a support hose again to mitigate symptoms No evidence of decompensated heart failure No clinical recurrence of atrial fibrillation continue his anticoagulant I gave her referral for home health that  family requested   Next appointment: 6 months   Medication Adjustments/Labs and Tests Ordered: Current medicines are reviewed at length with the patient today.  Concerns regarding medicines are outlined above.  No orders of the defined types were placed in this encounter.  Meds ordered this encounter  Medications   Droxidopa 200 MG CAPS    Sig: Take 200 mg by mouth in the morning and at bedtime.    Dispense:  180 capsule    Refill:  3     History of Present Illness:    Michael Mcknight is a 58 y.o. male with a hx of paroxysmal atrial fibrillation maintaining sinus syndrome heart failure initially reduced ejection fraction subsequently normalizing hypertension with stage IV CKDand RBBB last seen 11/15/2023.  He had another admission to the hospital with orthostatic intolerance systolic blood pressure is documented at 100 systolic on standing he was admitted to the hospital his dose of droxidopa was increased Florinef increased and continued on midodrine.  There is a notation in his CT showing cholesteatomas and  remarked upon that it could be the etiology of dizziness.  Compliance with diet, lifestyle and medications: Yes  He was in rehab for 3 weeks I am not sure why but for some reason they decreased his [place] to once daily He has had no further syncope he is now using an abdominal binder he was using support hose but they are going to go today and buy new locally in  [place]. His primary problem now is unexpected weight loss and he is seeing his primary care physician. Past Medical History:  Diagnosis Date   Acute kidney injury superimposed on CKD (HCC) 09/20/2021   AF (paroxysmal atrial fibrillation) (HCC) 09/20/2021   AKI (acute kidney injury) (HCC) 09/20/2021   Atrial fibrillation/flutter (HCC) 09/22/2019   Atypical atrial flutter (HCC)    Benign essential HTN 09/22/2019   Cardiomegaly 09/22/2019   Cardiorenal syndrome with renal failure 06/26/2020   Chronic systolic congestive heart failure (HCC) 06/26/2020   CKD (chronic kidney disease) stage 4, GFR 15-29 ml/min (HCC) 06/26/2020   CKD (chronic kidney disease), stage III (HCC) 09/22/2019   Congestive heart failure due to cardiomyopathy [facility]) [place] Heart Association class II 10/23/2019   Cyst of left kidney 10/04/2020   Dilated cardiomyopathy (HCC) ejection fraction 35% in November 2020 10/23/2019   Lobar pneumonia (HCC) 09/22/2019   Near syncope 09/20/2021   Obesity (BMI 30-39.9) 06/26/2020   Orthostatic hypotension 09/20/2021   Orthostatic hypotension dysautonomic syndrome 11/30/2023   Pleural effusion 09/22/2019   Postural dizziness with presyncope 11/29/2023   Prolonged QT  interval 09/20/2021   Schizophrenia (HCC) 09/22/2019    Current Medications: Current Meds  Medication Sig   acetaminophen (TYLENOL) 500 MG tablet Take 1,000 mg by mouth every 6 (six) hours as needed for moderate pain or headache.   Droxidopa 200 MG CAPS Take 200 mg by mouth in the morning and at bedtime.   ELIQUIS 5 MG TABS tablet TAKE ONE TABLET BY  MOUTH EVERY DAY and TAKE ONE TABLET BY MOUTH AT BEDTIME   escitalopram (LEXAPRO) 10 MG tablet Take 10 mg by mouth daily.   fludrocortisone (FLORINEF) 0.1 MG tablet Take 2 tablets (0.2 mg total) by mouth daily.   midodrine (PROAMATINE) 10 MG tablet Take 1 tablet (10 mg total) by mouth 3 (three) times daily with meals.   risperidone (RISPERDAL) 4 MG tablet Take 1 tablet (4 mg total) by mouth at bedtime.   trihexyphenidyl (ARTANE) 2 MG tablet Take 1 tablet (2 mg total) by mouth 2 (two) times daily.   [DISCONTINUED] Droxidopa 200 MG CAPS Take 200 mg by mouth daily.      EKGs/Labs/Other Studies Reviewed:    The following studies were reviewed today:  Cardiac Studies & Procedures   ______________________________________________________________________________________________     ECHOCARDIOGRAM  ECHOCARDIOGRAM COMPLETE 11/30/2023  Narrative ECHOCARDIOGRAM REPORT    [name] Date of Exam: 11/30/2023 Medical Rec #:  [id number]        Height:       66.0 in Accession #:    [id number]       Weight:       185.6 lb Date of Birth:  05-May-1966        BSA:          1.937 m Patient Age:    57 years         BP:           111/84 mmHg Patient Gender: M                HR:           90 bpm. Exam Location:  Inpatient  Procedure: 2D Echo, Cardiac Doppler and Color Doppler  Indications:    Dyspna, A-fib  History:        Patient has prior history of Echocardiogram examinations, most recent 08/27/2022. Cardiomegaly and Cardiomyopathy; Arrythmias:Atrial Fibrillation and Atrial Flutter.  Sonographer:    Vern Claude Referring Phys: [id number] PROSPER M AMPONSAH  IMPRESSIONS   1. Left ventricular ejection fraction, by estimation, is 55 to 60%. The left ventricle has normal function. The left ventricle has no regional wall motion abnormalities. Left ventricular diastolic parameters are indeterminate. 2. Right ventricular systolic function is normal. The right ventricular size is  normal. Tricuspid regurgitation signal is inadequate for assessing PA pressure. 3. A small pericardial effusion is present. The pericardial effusion is posterior to the left ventricle. 4. The mitral valve is grossly normal. Trivial mitral valve regurgitation. No evidence of mitral stenosis. 5. The aortic valve was not well visualized. Aortic valve regurgitation is not visualized. No aortic stenosis is present. 6. The inferior vena cava is normal in size with greater than 50% respiratory variability, suggesting right atrial pressure of 3 mmHg.  Comparison(s): No significant change from prior study. Prior images reviewed side by side.  FINDINGS Left Ventricle: Left ventricular ejection fraction, by estimation, is 55 to 60%. The left ventricle has normal function. The left ventricle has no regional wall motion abnormalities. The left ventricular internal cavity size was normal in  size. There is no left ventricular hypertrophy. Left ventricular diastolic parameters are indeterminate.  Right Ventricle: The right ventricular size is normal. No increase in right ventricular wall thickness. Right ventricular systolic function is normal. Tricuspid regurgitation signal is inadequate for assessing PA pressure.  Left Atrium: Left atrial size was normal in size.  Right Atrium: Right atrial size was normal in size.  Pericardium: A small pericardial effusion is present. The pericardial effusion is posterior to the left ventricle. Presence of epicardial fat layer.  Mitral Valve: The mitral valve is grossly normal. Trivial mitral valve regurgitation. No evidence of mitral valve stenosis. MV peak gradient, 1.1 mmHg. The mean mitral valve gradient is 1.0 mmHg.  Tricuspid Valve: The tricuspid valve is normal in structure. Tricuspid valve regurgitation is not demonstrated. No evidence of tricuspid stenosis.  Aortic Valve: The aortic valve was not well visualized. Aortic valve regurgitation is not visualized. No  aortic stenosis is present. Aortic valve mean gradient measures 2.3 mmHg. Aortic valve peak gradient measures 3.7 mmHg. Aortic valve area, by VTI measures 3.28 cm.  Pulmonic Valve: The pulmonic valve was normal in structure. Pulmonic valve regurgitation is not visualized. No evidence of pulmonic stenosis.  Aorta: The aortic root and ascending aorta are structurally normal, with no evidence of dilitation.  Venous: The inferior vena cava is normal in size with greater than 50% respiratory variability, suggesting right atrial pressure of 3 mmHg.  IAS/Shunts: The atrial septum is grossly normal.   LEFT VENTRICLE PLAX 2D LVIDd:         5.40 cm      Diastology LVIDs:         3.20 cm      LV e' medial:    5.33 cm/s LV PW:         0.80 cm      LV E/e' medial:  10.8 LV IVS:        0.70 cm      LV e' lateral:   5.98 cm/s LVOT diam:     2.00 cm      LV E/e' lateral: 9.6 LV SV:         50 LV SV Index:   26 LVOT Area:     3.14 cm  LV Volumes (MOD) LV vol d, MOD A2C: 108.0 ml LV vol d, MOD A4C: 145.0 ml LV vol s, MOD A2C: 52.3 ml LV vol s, MOD A4C: 59.6 ml LV SV MOD A2C:     55.7 ml LV SV MOD A4C:     145.0 ml LV SV MOD BP:      71.0 ml  RIGHT VENTRICLE             IVC RV S prime:     15.80 cm/s  IVC diam: 1.00 cm TAPSE (M-mode): 2.0 cm  LEFT ATRIUM           Index LA diam:      3.00 cm 1.55 cm/m LA Vol (A2C): 32.3 ml 16.67 ml/m LA Vol (A4C): 42.3 ml 21.83 ml/m AORTIC VALVE                    PULMONIC VALVE AV Area (Vmax):    3.30 cm     PV Vmax:       0.78 m/s AV Area (Vmean):   2.95 cm     PV Peak grad:  2.4 mmHg AV Area (VTI):     3.28 cm AV Vmax:  96.07 cm/s AV Vmean:          68.500 cm/s AV VTI:            0.153 m AV Peak Grad:      3.7 mmHg AV Mean Grad:      2.3 mmHg LVOT Vmax:         101.00 cm/s LVOT Vmean:        64.400 cm/s LVOT VTI:          0.160 m LVOT/AV VTI ratio: 1.04  AORTA Ao Root diam: 3.30 cm Ao Asc diam:  3.20 cm  MITRAL VALVE MV  Area (PHT): 3.03 cm    SHUNTS MV Area VTI:   4.65 cm    Systemic VTI:  0.16 m MV Peak grad:  1.1 mmHg    Systemic Diam: 2.00 cm MV Mean grad:  1.0 mmHg MV Vmax:       0.52 m/s MV Vmean:      34.6 cm/s MV Decel Time: 250 msec MV E velocity: 57.30 cm/s MV A velocity: 70.80 cm/s MV E/A ratio:  0.81  Riley Lam MD Electronically signed by Riley Lam MD Signature Date/Time: 11/30/2023/11:04:24 AM    Final   TEE  ECHO TEE 09/26/2019  Narrative TRANSESOPHOGEAL ECHO REPORT    Patient Name:   DORY VERDUN Hundal Date of Exam: 09/26/2019 Medical Rec #:  [id number]        Height:       67.0 in Accession #:    [id number]       Weight:       231.0 lb Date of Birth:  25-Dec-1965        BSA:          2.15 m Patient Age:    53 years         BP:           122/86 mmHg Patient Gender: M                HR:           120 bpm. Exam Location:  Inpatient   Procedure: Transesophageal Echo, Cardiac Doppler and Color Doppler  Indications:     I48.92* Unspecified atrial flutter  History:         Patient has no prior history of Echocardiogram examinations. Cardiomegaly; Arrythmias:Atrial Fibrillation and Atrial Flutter.  Sonographer:     Sheralyn Boatman RDCS Referring Phys:  [id number] Tacey Ruiz DUNN Diagnosing Phys: Kristeen Miss MD    PROCEDURE: The TEE was followed by a successful Cardioversion. Patients was monitored while under deep sedation Anesthestetic sedation was provided intravenously by Anesthesiology: 250mg  of Propofol. The transesophogeal probe was passed through the esophogus of the patient. Imaged were obtained with the patient in a left lateral decubitus position. The patient developed no complications during the procedure.  IMPRESSIONS   1. Left ventricular ejection fraction, by visual estimation, is 30 to 35%. The left ventricle has moderate to severely decreased function. There is no left ventricular hypertrophy. 2. Global right ventricle has mildly reduced systolic  function.The right ventricular size is normal. No increase in right ventricular wall thickness. 3. Left atrial size was normal. 4. No thrombi in the LA and LAA. 5. Right atrial size was normal. 6. The mitral valve is normal in structure. Mild to moderate mitral valve regurgitation. 7. The tricuspid valve is normal in structure. Tricuspid valve regurgitation moderate. 8. The aortic valve is normal in structure. Aortic valve regurgitation is trivial. 9.  The pulmonic valve was normal in structure. Pulmonic valve regurgitation is trivial. 10. Moderately elevated pulmonary artery systolic pressure.  FINDINGS Left Ventricle: Left ventricular ejection fraction, by visual estimation, is 30 to 35%. The left ventricle has moderate to severely decreased function. There is no left ventricular hypertrophy.  Right Ventricle: The right ventricular size is normal. No increase in right ventricular wall thickness. Global RV systolic function is has mildly reduced systolic function. The tricuspid regurgitant velocity is 3.01 m/s, and with an assumed right atrial pressure of 15 mmHg, the estimated right ventricular systolic pressure is moderately elevated at 51.2 mmHg.  Left Atrium: Left atrial size was normal in size. No thrombi in the LA and LAA.  Right Atrium: Right atrial size was normal in size  Pericardium: There is no evidence of pericardial effusion.  Mitral Valve: The mitral valve is normal in structure. There is mild thickening of the mitral valve leaflet(s). Mild to moderate mitral valve regurgitation.  Tricuspid Valve: The tricuspid valve is normal in structure. Tricuspid valve regurgitation moderate.  Aortic Valve: The aortic valve is normal in structure. Aortic valve regurgitation is trivial.  Pulmonic Valve: The pulmonic valve was normal in structure. Pulmonic valve regurgitation is trivial.  Aorta: The aortic root and ascending aorta are structurally normal, with no evidence of  dilitation.  Shunts: No atrial level shunt detected by color flow Doppler.   TRICUSPID VALVE             Normals TR Peak grad:   36.2 mmHg TR Vmax:        301.00 cm/s 288 cm/s   Kristeen Miss MD Electronically signed by Kristeen Miss MD Signature Date/Time: 09/26/2019/5:08:23 PM    Final        ______________________________________________________________________________________________          Recent Labs: 11/02/2023: ALT 106 11/29/2023: B Natriuretic Peptide 64.5; TSH 1.372 12/02/2023: Magnesium 2.0 12/05/2023: BUN 26; Creatinine, Ser 1.87; Hemoglobin 9.1; Platelets 639; Potassium 3.9; Sodium 137  Recent Lipid Panel    Component Value Date/Time   CHOL 138 09/23/2019 0247   TRIG 84 09/23/2019 0247   HDL 35 (L) 09/23/2019 0247   CHOLHDL 3.9 09/23/2019 0247   VLDL 17 09/23/2019 0247   LDLCALC 86 09/23/2019 0247    Physical Exam:    VS:  BP 102/64 (BP Location: Left Arm, Patient Position: Standing, Cuff Size: Normal)   Pulse (!) 101   Ht 5\' 6"  (1.676 m)   Wt 178 lb 9.6 oz (81 kg)   BMI 28.83 kg/m     Wt Readings from Last 3 Encounters:  12/28/23 178 lb 9.6 oz (81 kg)  12/04/23 185 lb 13.6 oz (84.3 kg)  11/15/23 185 lb (83.9 kg)     GEN:  Well nourished, well developed in no acute distress HEENT: Normal NECK: No JVD; No carotid bruits LYMPHATICS: No lymphadenopathy CARDIAC: RRR, no murmurs, rubs, gallops RESPIRATORY:  Clear to auscultation without rales, wheezing or rhonchi  ABDOMEN: Soft, non-tender, non-distended MUSCULOSKELETAL:  No edema; No deformity  SKIN: Warm and dry NEUROLOGIC:  Alert and oriented x 3 PSYCHIATRIC:  Normal affect    Signed, Norman Herrlich, MD  12/28/2023 2:29 PM    [facility]

## 2023-12-28 ENCOUNTER — Encounter: Payer: Self-pay | Admitting: Cardiology

## 2023-12-28 ENCOUNTER — Ambulatory Visit: Payer: PPO | Attending: Cardiology | Admitting: Cardiology

## 2023-12-28 VITALS — BP 102/64 | HR 101 | Ht 66.0 in | Wt 178.6 lb

## 2023-12-28 DIAGNOSIS — I480 Paroxysmal atrial fibrillation: Secondary | ICD-10-CM | POA: Diagnosis not present

## 2023-12-28 DIAGNOSIS — I951 Orthostatic hypotension: Secondary | ICD-10-CM | POA: Diagnosis not present

## 2023-12-28 DIAGNOSIS — N184 Chronic kidney disease, stage 4 (severe): Secondary | ICD-10-CM

## 2023-12-28 DIAGNOSIS — I130 Hypertensive heart and chronic kidney disease with heart failure and stage 1 through stage 4 chronic kidney disease, or unspecified chronic kidney disease: Secondary | ICD-10-CM

## 2023-12-28 DIAGNOSIS — I5032 Chronic diastolic (congestive) heart failure: Secondary | ICD-10-CM

## 2023-12-28 MED ORDER — DROXIDOPA 200 MG PO CAPS: 200.0000 mg | ORAL_CAPSULE | Freq: Two times a day (BID) | ORAL | 3 refills | Status: DC

## 2023-12-28 NOTE — Patient Instructions (Addendum)
Medication Instructions:  Your physician has recommended you make the following change in your medication:   START: Droxidopa 200 mg twice daily  *If you need a refill on your cardiac medications before your next appointment, please call your pharmacy*   Lab Work: None If you have labs (blood work) drawn today and your tests are completely normal, you will receive your results only by: MyChart Message (if you have MyChart) OR A paper copy in the mail If you have any lab test that is abnormal or we need to change your treatment, we will call you to review the results.   Testing/Procedures: None   Follow-Up: At [facility], you and your health needs are our priority.  As part of our continuing mission to provide you with exceptional heart care, we have created designated Provider Care Teams.  These Care Teams include your primary Cardiologist (physician) and Advanced Practice Providers (APPs -  Physician Assistants and Nurse Practitioners) who all work together to provide you with the care you need, when you need it.  We recommend signing up for the patient portal called "MyChart".  Sign up information is provided on this After Visit Summary.  MyChart is used to connect with patients for Virtual Visits (Telemedicine).  Patients are able to view lab/test results, encounter notes, upcoming appointments, etc.  Non-urgent messages can be sent to your provider as well.   To learn more about what you can do with MyChart, go to [email or web address].    Your next appointment:   3 month(s)  Provider:   Norman Herrlich, MD    Other Instructions New knee high compression hose

## 2023-12-29 ENCOUNTER — Encounter: Payer: Self-pay | Admitting: Cardiology

## 2023-12-29 DIAGNOSIS — I420 Dilated cardiomyopathy: Secondary | ICD-10-CM

## 2023-12-29 DIAGNOSIS — I517 Cardiomegaly: Secondary | ICD-10-CM

## 2023-12-29 DIAGNOSIS — I1310 Hypertensive heart and chronic kidney disease without heart failure, with stage 1 through stage 4 chronic kidney disease, or unspecified chronic kidney disease: Secondary | ICD-10-CM

## 2023-12-29 DIAGNOSIS — I4891 Unspecified atrial fibrillation: Secondary | ICD-10-CM

## 2023-12-29 DIAGNOSIS — I951 Orthostatic hypotension: Secondary | ICD-10-CM

## 2023-12-29 DIAGNOSIS — I509 Heart failure, unspecified: Secondary | ICD-10-CM

## 2024-01-03 ENCOUNTER — Telehealth: Payer: Self-pay | Admitting: Pharmacy Technician

## 2024-01-03 ENCOUNTER — Other Ambulatory Visit (HOSPITAL_COMMUNITY): Payer: Self-pay

## 2024-01-03 NOTE — Telephone Encounter (Signed)
Pharmacy Patient Advocate Encounter   Received notification from CoverMyMeds that prior authorization for Droxidopa is required/requested.   Insurance verification completed.   The patient is insured through [facility] ADVANTAGE/RX ADVANCE .   Per test claim: PA required; PA submitted to above mentioned insurance via CoverMyMeds Key/confirmation #/EOC [id number] Status is pending

## 2024-01-03 NOTE — Telephone Encounter (Signed)
Pharmacy Patient Advocate Encounter  Received notification from [facility] [employer]VANT[employer]/RX ADVANCE that Prior Authorization for Droxidopa has been APPROVED from 01/03/24 to 01/02/25   PA #/Case ID/Reference #: [id number]

## 2024-01-04 ENCOUNTER — Other Ambulatory Visit: Payer: Self-pay

## 2024-01-14 ENCOUNTER — Encounter: Payer: Self-pay | Admitting: Cardiology

## 2024-01-17 MED ORDER — DROXIDOPA 200 MG PO CAPS: 200.0000 mg | ORAL_CAPSULE | Freq: Two times a day (BID) | ORAL | 3 refills | Status: DC

## 2024-01-20 ENCOUNTER — Telehealth: Payer: Self-pay | Admitting: Cardiology

## 2024-01-20 MED ORDER — DROXIDOPA 200 MG PO CAPS: 200.0000 mg | ORAL_CAPSULE | Freq: Two times a day (BID) | ORAL | 3 refills | Status: DC

## 2024-01-20 NOTE — Telephone Encounter (Signed)
 Spoke with [place] who states that the nurse who saw the pt today felt the pt needed a heart monitor to see if pt was in afib. Heart rates today was 64-76. No indication why she felt that he needed same. Called and spoke with Darlene per DPR who states that the pt was very nervous when they came in and he was rocking back and forth. EMS was called as requested by home health nurse sitting BP 142/80 HR 75 and standing 146/68 HR 90 and after walking 138/70. Pt has an appointment tomorrow with Anson [place], NP.

## 2024-01-20 NOTE — Progress Notes (Signed)
 Cardiology Office Note:  .   Date:  01/21/2024  ID:  Michael Mcknight, DOB 1966/04/04, MRN [id number] PCP: Paulina Fusi, MD  [facility] Providers Cardiologist:  Norman Herrlich, MD    History of Present Illness: Michael Mcknight is a 58 y.o. male with a past medical history of hypertension, orthostatic hypotension with dysautonomia, atrial fibrillation, atrial flutter, autism spectrum disorder, HFrEF, CKD stage IV, prolonged QT interval, schizophrenia, OSA on CPAP, DM2.  11/30/2023 echo EF 55 to 60%, diastolic parameters were indeterminate, small pericardial effusion is present, trivial MR 08/27/2022 echo EF 55 to 60%, grade 2 DD, elevated LA pressure, mildly elevated PASP 05/14/2021 echo EF 55 to 60%, moderate concentric LVH, diastolic parameters were normal, no valvular abnormalities 09/26/2019 echo EF 30 to 35%, moderate to severely decreased LV function, no LVH, mild to moderate MR, moderate TR, moderately elevated PASP 2020 DCCV   Evaluated by Dr. Dulce Sellar on 08/03/2023 with complaints of dizziness, started on midodrine.   Admitted [facility] on 09/30/2023 with newly diagnosed diabetes, blood sugar was greater than 500, A1c was 15.5%. Evaluated 10/11/2023, midodrine was increased, Florinef was started. Admitted 11/29/2023 to 12/06/2023 to [facility] for orthostatic hypotension.  Evaluated by Dr. Dulce Sellar on 12/28/2023, his droxidopa was increased.  He had another admission to the hospital for orthostatic hypotension, medications were adjusted, there was a notation on his CT showing cholesteatomas as a possibility as the etiology of his dizziness.  Home health was evaluated him in his home on 01/20/2024, the nurse became very concerned about his orthostatic hypotension, she contacted EMS--this all occurred while his sister was not at home who was his primary caregiver.  EMS arrived, EKG was normal, nurse kept insisting that he needed to be wearing some type of monitor.   Ultimately, his sister advised that he was at his baseline and will be staying home however agreed to follow-up with our office today.  Kidney presents in a wheelchair, his dysautonomia is disabling for him.  He is compliant with compression socks--cannot tolerate thigh-high secondary to knee pain, also wears an abdominal binder.  Blood pressure sitting is 160/80, standing 120/60.  His sister is understandably frustrated at the rapid deterioration of his condition, all of his symptoms seem to started in August 2024.    ROS: Review of Systems  Constitutional:  Positive for weight loss.  Neurological:  Positive for dizziness.  All other systems reviewed and are negative.    Studies Reviewed: .        Cardiac Studies & Procedures   ______________________________________________________________________________________________     ECHOCARDIOGRAM  ECHOCARDIOGRAM COMPLETE 11/30/2023  Narrative ECHOCARDIOGRAM REPORT    [name] Date of Exam: 11/30/2023 Medical Rec #:  [id number]        Height:       66.0 in Accession #:    [id number]       Weight:       185.6 lb Date of Birth:  1965-12-02        BSA:          1.937 m Patient Age:    57 years         BP:           111/84 mmHg Patient Gender: M                HR:           90 bpm. Exam Location:  Inpatient  Procedure: 2D Echo, Cardiac Doppler and Color Doppler  Indications:    Dyspna, A-fib  History:        Patient has prior history of Echocardiogram examinations, most recent 08/27/2022. Cardiomegaly and Cardiomyopathy; Arrythmias:Atrial Fibrillation and Atrial Flutter.  Sonographer:    Vern Claude Referring Phys: [id number] Michael Mcknight  IMPRESSIONS   1. Left ventricular ejection fraction, by estimation, is 55 to 60%. The left ventricle has normal function. The left ventricle has no regional wall motion abnormalities. Left ventricular diastolic parameters are indeterminate. 2. Right ventricular  systolic function is normal. The right ventricular size is normal. Tricuspid regurgitation signal is inadequate for assessing PA pressure. 3. A small pericardial effusion is present. The pericardial effusion is posterior to the left ventricle. 4. The mitral valve is grossly normal. Trivial mitral valve regurgitation. No evidence of mitral stenosis. 5. The aortic valve was not well visualized. Aortic valve regurgitation is not visualized. No aortic stenosis is present. 6. The inferior vena cava is normal in size with greater than 50% respiratory variability, suggesting right atrial pressure of 3 mmHg.  Comparison(s): No significant change from prior study. Prior images reviewed side by side.  FINDINGS Left Ventricle: Left ventricular ejection fraction, by estimation, is 55 to 60%. The left ventricle has normal function. The left ventricle has no regional wall motion abnormalities. The left ventricular internal cavity size was normal in size. There is no left ventricular hypertrophy. Left ventricular diastolic parameters are indeterminate.  Right Ventricle: The right ventricular size is normal. No increase in right ventricular wall thickness. Right ventricular systolic function is normal. Tricuspid regurgitation signal is inadequate for assessing PA pressure.  Left [facility]: Left atrial size was normal in size.  Right [facility]: Right atrial size was normal in size.  Pericardium: A small pericardial effusion is present. The pericardial effusion is posterior to the left ventricle. Presence of epicardial fat layer.  Mitral Valve: The mitral valve is grossly normal. Trivial mitral valve regurgitation. No evidence of mitral valve stenosis. MV peak gradient, 1.1 mmHg. The mean mitral valve gradient is 1.0 mmHg.  Tricuspid Valve: The tricuspid valve is normal in structure. Tricuspid valve regurgitation is not demonstrated. No evidence of tricuspid stenosis.  Aortic Valve: The aortic valve was not well  visualized. Aortic valve regurgitation is not visualized. No aortic stenosis is present. Aortic valve mean gradient measures 2.3 mmHg. Aortic valve peak gradient measures 3.7 mmHg. Aortic valve area, by VTI measures 3.28 cm.  Pulmonic Valve: The pulmonic valve was normal in structure. Pulmonic valve regurgitation is not visualized. No evidence of pulmonic stenosis.  Aorta: The aortic root and ascending aorta are structurally normal, with no evidence of dilitation.  Venous: The inferior vena cava is normal in size with greater than 50% respiratory variability, suggesting right atrial pressure of 3 mmHg.  IAS/Shunts: The atrial septum is grossly normal.   LEFT VENTRICLE PLAX 2D LVIDd:         5.40 cm      Diastology LVIDs:         3.20 cm      LV e' medial:    5.33 cm/s LV PW:         0.80 cm      LV E/e' medial:  10.8 LV IVS:        0.70 cm      LV e' lateral:   5.98 cm/s LVOT diam:     2.00 cm      LV E/e' lateral: 9.6 LV  SV:         50 LV SV Index:   26 LVOT Area:     3.14 cm  LV Volumes (MOD) LV vol d, MOD A2C: 108.0 ml LV vol d, MOD A4C: 145.0 ml LV vol s, MOD A2C: 52.3 ml LV vol s, MOD A4C: 59.6 ml LV SV MOD A2C:     55.7 ml LV SV MOD A4C:     145.0 ml LV SV MOD BP:      71.0 ml  RIGHT VENTRICLE             IVC RV S prime:     15.80 cm/s  IVC diam: 1.00 cm TAPSE (M-mode): 2.0 cm  LEFT ATRIUM           Index LA diam:      3.00 cm 1.55 cm/m LA Vol (A2C): 32.3 ml 16.67 ml/m LA Vol (A4C): 42.3 ml 21.83 ml/m AORTIC VALVE                    PULMONIC VALVE AV Area (Vmax):    3.30 cm     PV Vmax:       0.78 m/s AV Area (Vmean):   2.95 cm     PV Peak grad:  2.4 mmHg AV Area (VTI):     3.28 cm AV Vmax:           96.07 cm/s AV Vmean:          68.500 cm/s AV VTI:            0.153 m AV Peak Grad:      3.7 mmHg AV Mean Grad:      2.3 mmHg LVOT Vmax:         101.00 cm/s LVOT Vmean:        64.400 cm/s LVOT VTI:          0.160 m LVOT/AV VTI ratio: 1.04  AORTA Ao Root  diam: 3.30 cm Ao Asc diam:  3.20 cm  MITRAL VALVE MV Area (PHT): 3.03 cm    SHUNTS MV Area VTI:   4.65 cm    Systemic VTI:  0.16 m MV Peak grad:  1.1 mmHg    Systemic Diam: 2.00 cm MV Mean grad:  1.0 mmHg MV Vmax:       0.52 m/s MV Vmean:      34.6 cm/s MV Decel Time: 250 msec MV E velocity: 57.30 cm/s MV A velocity: 70.80 cm/s MV E/A ratio:  0.81  Riley Lam MD Electronically signed by Riley Lam MD Signature Date/Time: 11/30/2023/11:04:24 AM    Final   TEE  ECHO TEE 09/26/2019  Narrative TRANSESOPHOGEAL ECHO REPORT    [name] Date of Exam: 09/26/2019 Medical Rec #:  [id number]        Height:       67.0 in Accession #:    [id number]       Weight:       231.0 lb Date of Birth:  1966/03/09        BSA:          2.15 m Patient Age:    53 years         BP:           122/86 mmHg Patient Gender: M                HR:           120  bpm. Exam Location:  Inpatient   Procedure: Transesophageal Echo, Cardiac Doppler and Color Doppler  Indications:     I48.92* Unspecified atrial flutter  History:         Patient has no prior history of Echocardiogram examinations. Cardiomegaly; Arrythmias:Atrial Fibrillation and Atrial Flutter.  Sonographer:     Sheralyn Boatman RDCS Referring Phys:  [id number] Tacey Ruiz DUNN Diagnosing Phys: Kristeen Miss MD    PROCEDURE: The TEE was followed by a successful Cardioversion. Patients was monitored while under deep sedation Anesthestetic sedation was provided intravenously by Anesthesiology: 250mg  of Propofol. The transesophogeal probe was passed through the esophogus of the patient. Imaged were obtained with the patient in a left lateral decubitus position. The patient developed no complications during the procedure.  IMPRESSIONS   1. Left ventricular ejection fraction, by visual estimation, is 30 to 35%. The left ventricle has moderate to severely decreased function. There is no left ventricular  hypertrophy. 2. Global right ventricle has mildly reduced systolic function.The right ventricular size is normal. No increase in right ventricular wall thickness. 3. Left atrial size was normal. 4. No thrombi in the LA and LAA. 5. Right atrial size was normal. 6. The mitral valve is normal in structure. Mild to moderate mitral valve regurgitation. 7. The tricuspid valve is normal in structure. Tricuspid valve regurgitation moderate. 8. The aortic valve is normal in structure. Aortic valve regurgitation is trivial. 9. The pulmonic valve was normal in structure. Pulmonic valve regurgitation is trivial. 10. Moderately elevated pulmonary artery systolic pressure.  FINDINGS Left Ventricle: Left ventricular ejection fraction, by visual estimation, is 30 to 35%. The left ventricle has moderate to severely decreased function. There is no left ventricular hypertrophy.  Right Ventricle: The right ventricular size is normal. No increase in right ventricular wall thickness. Global RV systolic function is has mildly reduced systolic function. The tricuspid regurgitant velocity is 3.01 m/s, and with an assumed right atrial pressure of 15 mmHg, the estimated right ventricular systolic pressure is moderately elevated at 51.2 mmHg.  Left [facility]: Left atrial size was normal in size. No thrombi in the LA and LAA.  Right [facility]: Right atrial size was normal in size  Pericardium: There is no evidence of pericardial effusion.  Mitral Valve: The mitral valve is normal in structure. There is mild thickening of the mitral valve leaflet(s). Mild to moderate mitral valve regurgitation.  Tricuspid Valve: The tricuspid valve is normal in structure. Tricuspid valve regurgitation moderate.  Aortic Valve: The aortic valve is normal in structure. Aortic valve regurgitation is trivial.  Pulmonic Valve: The pulmonic valve was normal in structure. Pulmonic valve regurgitation is trivial.  Aorta: The aortic root and  ascending aorta are structurally normal, with no evidence of dilitation.  Shunts: No atrial level shunt detected by color flow Doppler.   TRICUSPID VALVE             Normals TR Peak grad:   36.2 mmHg TR Vmax:        301.00 cm/s 288 cm/s   Kristeen Miss MD Electronically signed by Kristeen Miss MD Signature Date/Time: 09/26/2019/5:08:23 PM    Final        ______________________________________________________________________________________________      Risk Assessment/Calculations:    CHA2DS2-VASc Score = 2   This indicates a 2.2% annual risk of stroke. The patient's score is based upon: CHF History: 1 HTN History: 1 Diabetes History: 0 Stroke History: 0 Vascular Disease History: 0 Age Score: 0 Gender Score: 0  Physical Exam:   VS:  BP 120/60 (BP Location: Left Arm, Patient Position: Standing, Cuff Size: Normal)   Pulse 67   Ht 5\' 6"  (1.676 m)   Wt 181 lb (82.1 kg)   SpO2 99%   BMI 29.21 kg/m    Wt Readings from Last 3 Encounters:  01/21/24 181 lb (82.1 kg)  12/28/23 178 lb 9.6 oz (81 kg)  12/04/23 185 lb 13.6 oz (84.3 kg)    GEN: Well nourished, well developed in no acute distress--visibly anxious as MD offices make him nervous NECK: No JVD; No carotid bruits CARDIAC: RRR, no murmurs, rubs, gallops RESPIRATORY:  Clear to auscultation without rales, wheezing or rhonchi  ABDOMEN: Soft, non-tender, non-distended EXTREMITIES:  No edema; No deformity   ASSESSMENT AND PLAN: .   HFrEF - NYHA class I, euvolemic, most recent echo in October 2023 showed normalization of EF, grade 2 DD.  GDMT is prohibited secondary to kidney dysfunction.  Not on BB secondary to ongoing dizziness.   DM2 -previously hospitalized with new diagnosis of hyperglycemia however currently he is not needing anything to control his blood sugar.  Paroxysmal atrial fibrillation/hypercoagulable state-s/p DCCV in 2020, he is in sinus rhythm today.  CHA2DS2-VASc score of 2, continue  Eliquis 5 mg daily--no indication for dose reduction.  Denies hematochezia, hematuria, hemoptysis.  Home health nurse apparently appreciated some palpitations yesterday, will arrange for repeat monitor.  Hypotension/dysautonomia-I feel like at this point this is most likely related to dysautonomia.  We have started him on a stepwise fashion of midodrine, he is now taking midodrine 10 mg 3 times daily, he is taking fludrocortisone 2 mg every day, currently taking droxidopa 200 mg in the morning-as recommended they increase to 200 in the evening but his sister would like to increase to 100 in the evening--continue droxidopa 200 mg in the morning and 100 mg in the evening.  She will reach out to [place] via MyChart if we need to increase to 200 mg in the evening.  I discussed with Dr. Dulce Sellar, we need to get him established with a neurologist to see if there is anything from that perspective that could be of assistance with his care.  CKD - follows with [facility] and will see them in October of this year; most recent creatinine 2.08, GFR 37. Careful titration of diuretic and antihypertensive.    Schizophrenia - on Risperdal and Artane, both can cause dizziness, however he has been on both for some time and his sister feels the risks outweighs the benefit for him.        Dispo: Monitor for 2 weeks, ambulatory to neurology with [facility]--anyone that specializes in dysautonomia.  Already has follow-up with Dr. Dulce Sellar in May.  Signed, Flossie Dibble, NP

## 2024-01-20 NOTE — Telephone Encounter (Signed)
 Pt c/o BP issue: STAT if pt c/o blurred vision, one-sided weakness or slurred speech  1. What are your last 5 BP readings?  88/40 168/92 130/70 149/75 80/58 All taken between 11:30 A.M. and 1 P.M. today 03/06  2. Are you having any other symptoms (ex. Dizziness, headache, blurred vision, passed out)? Dizziness   3. What is your BP issue?  Pt's Home Health Nurse is calling to let office know that patient's BP was jumping all over the place today and had EMS come out. She states that pt refused to go to ED and she would like a call back to discuss tests that she fells the patient would benefit from. Please advise.

## 2024-01-21 ENCOUNTER — Encounter: Payer: Self-pay | Admitting: Cardiology

## 2024-01-21 ENCOUNTER — Ambulatory Visit: Attending: Cardiology

## 2024-01-21 ENCOUNTER — Ambulatory Visit: Attending: Cardiology | Admitting: Cardiology

## 2024-01-21 VITALS — BP 120/60 | HR 67 | Ht 66.0 in | Wt 181.0 lb

## 2024-01-21 DIAGNOSIS — I5020 Unspecified systolic (congestive) heart failure: Secondary | ICD-10-CM | POA: Diagnosis not present

## 2024-01-21 DIAGNOSIS — I480 Paroxysmal atrial fibrillation: Secondary | ICD-10-CM

## 2024-01-21 DIAGNOSIS — G901 Familial dysautonomia [Riley-Day]: Secondary | ICD-10-CM | POA: Diagnosis not present

## 2024-01-21 DIAGNOSIS — E1169 Type 2 diabetes mellitus with other specified complication: Secondary | ICD-10-CM

## 2024-01-21 DIAGNOSIS — R55 Syncope and collapse: Secondary | ICD-10-CM

## 2024-01-21 DIAGNOSIS — F209 Schizophrenia, unspecified: Secondary | ICD-10-CM

## 2024-01-21 MED ORDER — DROXIDOPA 200 MG PO CAPS: ORAL_CAPSULE | ORAL | 3 refills | Status: DC

## 2024-01-21 NOTE — Patient Instructions (Signed)
 Medication Instructions:  Your physician recommends that you continue on your current medications as directed. Please refer to the Current Medication list given to you today.  *If you need a refill on your cardiac medications before your next appointment, please call your pharmacy*   Lab Work: None Ordered If you have labs (blood work) drawn today and your tests are completely normal, you will receive your results only by: MyChart Message (if you have MyChart) OR A paper copy in the mail If you have any lab test that is abnormal or we need to change your treatment, we will call you to review the results.   Testing/Procedures:  WHY IS MY DOCTOR PRESCRIBING ZIO? The Zio system is proven and trusted by physicians to detect and diagnose irregular heart rhythms -- and has been prescribed to hundreds of thousands of patients.  The FDA has cleared the Zio system to monitor for many different kinds of irregular heart rhythms. In a study, physicians were able to reach a diagnosis 90% of the time with the Zio system1.  You can wear the Zio monitor -- a small, discreet, comfortable patch -- during your normal day-to-day activity, including while you sleep, shower, and exercise, while it records every single heartbeat for analysis.  1Barrett, P., et al. Comparison of 24 Hour Holter Monitoring Versus 14 Day Novel Adhesive Patch Electrocardiographic Monitoring. American Journal of Medicine, 2014.  ZIO VS. HOLTER MONITORING The Zio monitor can be comfortably worn for up to 14 days. Holter monitors can be worn for 24 to 48 hours, limiting the time to record any irregular heart rhythms you may have. Zio is able to capture data for the 51% of patients who have their first symptom-triggered arrhythmia after 48 hours.1  LIVE WITHOUT RESTRICTIONS The Zio ambulatory cardiac monitor is a small, unobtrusive, and water-resistant patch--you might even forget you're wearing it. The Zio monitor records and stores  every beat of your heart, whether you're sleeping, working out, or showering.     Follow-Up: At [facility], you and your health needs are our priority.  As part of our continuing mission to provide you with exceptional heart care, we have created designated Provider Care Teams.  These Care Teams include your primary Cardiologist (physician) and Advanced Practice Providers (APPs -  Physician Assistants and Nurse Practitioners) who all work together to provide you with the care you need, when you need it.  We recommend signing up for the patient portal called "MyChart".  Sign up information is provided on this After Visit Summary.  MyChart is used to connect with patients for Virtual Visits (Telemedicine).  Patients are able to view lab/test results, encounter notes, upcoming appointments, etc.  Non-urgent messages can be sent to your provider as well.   To learn more about what you can do with MyChart, go to [email or web address].    Your next appointment:   Follow up in May  The format for your next appointment:   In Person  Provider:   Wallis Bamberg, NP   Other Instructions Referral to [facility] - they will call for appt

## 2024-02-23 ENCOUNTER — Other Ambulatory Visit: Payer: Self-pay

## 2024-02-24 MED ORDER — FLUDROCORTISONE ACETATE 0.1 MG PO TABS: 100.0000 ug | ORAL_TABLET | Freq: Every day | ORAL | 2 refills | Status: DC

## 2024-03-02 ENCOUNTER — Telehealth: Payer: Self-pay | Admitting: *Deleted

## 2024-03-02 NOTE — Telephone Encounter (Signed)
 Spoke with sister about pt's dosage of Fludrocortisone. Received fax from [facility] for refill but had already sent to [facility]. According to sister [facility] never got the rx. Looked at provider notes and saw that the dosage for this medication is .02mg  daily. Darlene stated she would rather deal with [facility] so sent the refill in there and verified the right dosage of the Midodrine per her request. Pharmacy verified correct dosage of Midodrine and refilled the Fludrocortisone 0.1 mg to take 2 tablets daily.

## 2024-03-24 ENCOUNTER — Other Ambulatory Visit: Payer: Self-pay

## 2024-03-27 ENCOUNTER — Ambulatory Visit: Admitting: Cardiology

## 2024-03-27 ENCOUNTER — Ambulatory Visit

## 2024-03-27 ENCOUNTER — Ambulatory Visit: Payer: PPO | Admitting: Cardiology

## 2024-03-27 VITALS — BP 128/74 | HR 66 | Ht 66.0 in | Wt 176.6 lb

## 2024-03-27 DIAGNOSIS — I429 Cardiomyopathy, unspecified: Secondary | ICD-10-CM

## 2024-03-27 DIAGNOSIS — I4892 Unspecified atrial flutter: Secondary | ICD-10-CM

## 2024-03-27 DIAGNOSIS — I4891 Unspecified atrial fibrillation: Secondary | ICD-10-CM | POA: Diagnosis not present

## 2024-03-27 DIAGNOSIS — I509 Heart failure, unspecified: Secondary | ICD-10-CM

## 2024-03-27 DIAGNOSIS — I951 Orthostatic hypotension: Secondary | ICD-10-CM | POA: Diagnosis not present

## 2024-03-27 MED ORDER — DROXIDOPA 200 MG PO CAPS: 200.0000 mg | ORAL_CAPSULE | Freq: Two times a day (BID) | ORAL | 3 refills | Status: AC

## 2024-03-27 MED ORDER — FLUDROCORTISONE ACETATE 0.1 MG PO TABS: 0.2000 mg | ORAL_TABLET | Freq: Every day | ORAL | 3 refills | Status: AC

## 2024-03-27 MED ORDER — MIDODRINE HCL 10 MG PO TABS: 10.0000 mg | ORAL_TABLET | Freq: Three times a day (TID) | ORAL | 3 refills | Status: AC

## 2024-03-27 NOTE — Progress Notes (Signed)
 Cardiology Consultation:    Date:  03/27/2024   ID:  Michael Mcknight, DOB 1966-07-10, MRN [id number]  PCP:  Adrian Hopper, MD  Cardiologist:  Daymon Evans Michoel Kunin, MD   Referring MD: Adrian Hopper, MD   No chief complaint on file.    ASSESSMENT AND PLAN:   Michael Mcknight 58 year old male with very complicated history seen today for the first time for follow-up.  Has history of chronic failure with recovered ejection fraction initially identified in the setting of atrial fibrillation with RVR back in 2020, subsequently normalized EF, CKD stage IV hypertension, markedly symptomatic orthostatic hypotension attributed to dysautonomia worsening since August 2024 [requiring midodrine , fludrocortisone  and droxidopa  and uses abdominal binders; unable to tolerate thigh-high binders due to knee pain], atrial fibrillation/flutter s/p cardioversion in 2020 [was briefly on amiodarone  and subsequently discontinued], transient prolonged QT interval, schizophrenia, autism spectrum disorder, obstructive sleep apnea-uses CPAP, diabetes mellitus type 2, with normal biventricular function on echocardiogram January 2025 with EF 55 to 60% [was 30 to 35% back in November 2020], trace MR and small pericardial effusion [on my review do not appreciate significant pericardial effusion and echo study was extremely limited quality].  No significant atrial fibrillation/flutter burden on 14-day Zio patch from March 2025 but noted significant supraventricular ectopy burden up to 10.8 percent and was asymptomatic.  Here for follow-up visit issues seemingly appear to be related to functional limitations and severe orthostatic symptoms. I wonder if there is a competent of his neurological/behavioral issues playing a role here. Neurology referral was previously placed and currently pending to be scheduled. Will likely need referral to specialized dysautonomia clinic after evaluation with neurologist.  Problem List Items  Addressed This Visit     Atrial fibrillation/flutter (HCC) - Primary   Initially diagnosed in 2020 with RVR in association with reduced LVEF. S/p cardioversion in 2020. Was briefly on amiodarone  for rhythm control.  Remains in sinus rhythm. CHA2DS2-VASc score at least 3 [hypertension, diabetes, heart failure] Remains on coagulation with Eliquis  5 mg twice daily.  No significant A-fib/flutter block done on recent Zio patch March 2025.      Relevant Medications   Droxidopa  200 MG CAPS   midodrine  (PROAMATINE ) 10 MG tablet   Congestive heart failure due to cardiomyopathy (HCC) [place]  Heart Association class II   CHF, with newly reduced LVEF in the setting of A-fib RVR in 2020, subsequently normalized LVEF. Currently does not show any signs or symptoms of fluid overload. In fact his symptoms tend to be related to orthostatic hypotension.  Appears euvolemic, compensated. No prior ischemic workup available to review, with recovered LVEF I will hold off on any ischemic workup at this time.      Relevant Medications   Droxidopa  200 MG CAPS   midodrine  (PROAMATINE ) 10 MG tablet   Orthostatic hypotension dysautonomic syndrome   Significant orthostatic symptoms attributed to orthostatic hypotension in the setting of presumed dysautonomia.  Surprisingly symptoms have been significantly progressive since August 2024. He does have risk factors for dysautonomia in the setting of markedly uncontrolled diabetes, schizophrenia.  Currently on multiple medications to avoid orthostatic hypotension. Has been on midodrine , droxidopa  and Florinef . Has been tolerating these well. Continue the same for now.  Recommended neurologist visit at previous follow-up visit at our office this is currently pending.  Will have office see if they can expedite this appointment for them.  I anticipate him requiring referral to dysautonomia speciality clinic for further evaluation.  Relevant Medications    Droxidopa  200 MG CAPS   midodrine  (PROAMATINE ) 10 MG tablet   Return to clinic tentatively for follow-up in 3 months.   History of Present Illness:    Michael Mcknight is a 58 y.o. male who is being seen today for follow-up visit. PCP is Adrian Hopper, MD. Last visit with our office was 01/21/2024 with Pattricia Bores, NP-C and had been following up with Dr. Sandee Crook at our office. Follows up with nephrologist at [facility].  Here for the visit today accompanied by his sister who is his caregiver.  She is the primary information provided at this time apart from review of medical records.  He himself has limited insight into his overall condition.  She mentions significant frustration with his rapidly deteriorating health since last year.  She is looking at avenues for him to be established at a assisted living facility.  She is also concerned about neurology appointment having not been scheduled yet.  Has history of chronic failure with recovered ejection fraction initially identified in the setting of atrial fibrillation with RVR back in 2020, subsequently normalized EF, CKD stage IV hypertension, markedly symptomatic orthostatic hypotension attributed to dysautonomia worsening since August 2024 [requiring midodrine , fludrocortisone  and droxidopa  and uses abdominal binders; unable to tolerate thigh-high binders due to knee pain], atrial fibrillation/flutter s/p cardioversion in 2020 [was briefly on amiodarone  and subsequently discontinued], transient prolonged QT interval, schizophrenia, autism spectrum disorder, obstructive sleep apnea-uses CPAP, diabetes mellitus type 2, with normal biventricular function on echocardiogram January 2025 with EF 55 to 60% [was 30 to 35% back in November 2020], trace MR and small pericardial effusion.  14-day Zio patch monitor results from 01-21-2024 reported average heart rate 66/min in sinus rhythm predominantly [ranging from 48 to 129 bpm].  Rare ventricular  ectopy burden less than 1%.  Supraventricular ectopy burden frequent 10.8%.  Frequent supraventricular tachycardia episodes noted with the longest episode lasting 13 seconds.  No runs of ventricular ectopy.  No patient triggered events.  No evidence of atrial fibrillation or flutter.  Blood work from 03/08/2024 CBC hemoglobin 10.6, hematocrit 31.5, platelets 624, WBC 8.5. CMP with BUN 28, creatinine 1.91, EGFR 41 Normal sodium and potassium.  Normal transaminases and alkaline phosphatase. Magnesium 2.  Overall at this time no marked change in his baseline functional status.  Per his wife he has been significantly limited due to the lightheadedness. Lays reclined to sleep at night to avoid supine hypertension with his current medications. Denies any chest pain, palpitations, shortness of breath. No pedal edema. Uses abdominal binders. Unable to use lower extremity compression stockings due to discomfort and pain. Denies any blood in urine or stools. Taking medications currently as prescribed.  Last echocardiogram from 11-30-2023, images reviewed myself personally.  Was an extremely limited quality study.  Grossly normal LV function.  However I do not see any evidence of pericardial effusion on my review.  Valve structure was suboptimal in visualization.   Past Medical History:  Diagnosis Date   Acute kidney injury superimposed on CKD (HCC) 09/20/2021   AF (paroxysmal atrial fibrillation) (HCC) 09/20/2021   AKI (acute kidney injury) (HCC) 09/20/2021   Atrial fibrillation/flutter (HCC) 09/22/2019   Atypical atrial flutter (HCC)    Benign essential HTN 09/22/2019   Cardiomegaly 09/22/2019   Cardiorenal syndrome with renal failure 06/26/2020   Chronic systolic congestive heart failure (HCC) 06/26/2020   CKD (chronic kidney disease) stage 4, GFR 15-29 ml/min (HCC) 06/26/2020   CKD (chronic kidney  disease), stage III (HCC) 09/22/2019   Congestive heart failure due to cardiomyopathy [facility]) [place] Heart Association class II 10/23/2019   Cyst of left kidney 10/04/2020   Dilated cardiomyopathy (HCC) ejection fraction 35% in November 2020 10/23/2019   Lobar pneumonia (HCC) 09/22/2019   Near syncope 09/20/2021   Obesity (BMI 30-39.9) 06/26/2020   Orthostatic hypotension 09/20/2021   Orthostatic hypotension dysautonomic syndrome 11/30/2023   Pleural effusion 09/22/2019   Postural dizziness with presyncope 11/29/2023   Prolonged QT interval 09/20/2021   Schizophrenia (HCC) 09/22/2019    Past Surgical History:  Procedure Laterality Date   APPENDECTOMY     CARDIOVERSION N/A 09/26/2019   Procedure: CARDIOVERSION;  Surgeon: Alroy Aspen Lela Purple, MD;  Location: [facility] ENDOSCOPY;  Service: Cardiovascular;  Laterality: N/A;   TEE WITHOUT CARDIOVERSION N/A 09/26/2019   Procedure: TRANSESOPHAGEAL ECHOCARDIOGRAM (TEE);  Surgeon: Alroy Aspen Lela Purple, MD;  Location[facility] ENDOSCOPY;  Service: Cardiovascular;  Laterality: N/A;    Current Medications: Current Meds  Medication Sig   cyanocobalamin  (VITAMIN B12) 1000 MCG tablet Take 1,000 mcg by mouth daily.   ELIQUIS  5 MG TABS tablet TAKE ONE TABLET BY MOUTH EVERY DAY and TAKE ONE TABLET BY MOUTH AT BEDTIME   ferrous sulfate 325 (65 FE) MG tablet Take 1 tablet by mouth 2 (two) times daily.   fludrocortisone  (FLORINEF ) 0.1 MG tablet Take 2 tablets (0.2 mg total) by mouth daily.   FLUDROCORTISONE  ACETATE PO Take 2 mg by mouth daily.   folic acid  (FOLVITE ) 1 MG tablet Take 1 mg by mouth daily.   risperidone  (RISPERDAL ) 4 MG tablet Take 4 mg by mouth at bedtime.   trihexyphenidyl  (ARTANE ) 2 MG tablet Take 2 mg by mouth 2 (two) times daily.   [DISCONTINUED] Droxidopa  200 MG CAPS Take 200 mg by mouth 2 (two) times daily.   [DISCONTINUED] midodrine  (PROAMATINE ) 10 MG tablet Take 10 mg by mouth 3 (three) times daily.     Allergies:   Codeine   Social History   Socioeconomic History   Marital status: Divorced    Spouse name: Not on file   Number of  children: Not on file   Years of education: Not on file   Highest education level: Not on file  Occupational History   Not on file  Tobacco Use   Smoking status: Never   Smokeless tobacco: Never  Substance and Sexual Activity   Alcohol use: Not on file   Drug use: Not on file   Sexual activity: Not on file  Other Topics Concern   Not on file  Social History Narrative   Not on file   Social Drivers of Health   Financial Resource Strain: Not on file  Food Insecurity: No Food Insecurity (11/30/2023)   Hunger Vital Sign    Worried About Running Out of Food in the Last Year: Never true    Ran Out of Food in the Last Year: Never true  Transportation Needs: No Transportation Needs (11/30/2023)   PRAPARE - Administrator, Civil Service (Medical): No    Lack of Transportation (Non-Medical): No  Physical Activity: Not on file  Stress: Not on file  Social Connections: Not on file     Family History: The patient's family history includes Cancer in his father and sister; Diabetes in his sister. There is no history of Hypertension or Heart disease. ROS:   Please see the history of present illness.    All 14 point review of systems negative except as  described per history of present illness.  EKGs/Labs/Other Studies Reviewed:    The following studies were reviewed today:   EKG:       Recent Labs: 11/02/2023: ALT 106 11/29/2023: B Natriuretic Peptide 64.5; TSH 1.372 12/02/2023: Magnesium 2.0 12/05/2023: BUN 26; Creatinine, Ser 1.87; Hemoglobin 9.1; Platelets 639; Potassium 3.9; Sodium 137  Recent Lipid Panel    Component Value Date/Time   CHOL 138 09/23/2019 0247   TRIG 84 09/23/2019 0247   HDL 35 (L) 09/23/2019 0247   CHOLHDL 3.9 09/23/2019 0247   VLDL 17 09/23/2019 0247   LDLCALC 86 09/23/2019 0247    Physical Exam:    VS:  BP 128/74   Pulse 66   Ht 5\' 6"  (1.676 m)   Wt 176 lb 9.6 oz (80.1 kg)   SpO2 98%   BMI 28.50 kg/m     Wt Readings from Last 3  Encounters:  03/27/24 176 lb 9.6 oz (80.1 kg)  01/21/24 181 lb (82.1 kg)  12/28/23 178 lb 9.6 oz (81 kg)     GENERAL:  Well nourished, well developed in no acute distress NECK: No JVD; No carotid bruits CARDIAC: RRR, S1 and S2 present, no murmurs, no rubs, no gallops CHEST:  Clear to auscultation without rales, wheezing or rhonchi  Extremities: No pitting pedal edema. Pulses bilaterally symmetric with radial 2+ and dorsalis pedis 2+ NEUROLOGIC:  Alert and oriented x 3  Medication Adjustments/Labs and Tests Ordered: Current medicines are reviewed at length with the patient today.  Concerns regarding medicines are outlined above.  No orders of the defined types were placed in this encounter.  Meds ordered this encounter  Medications   Droxidopa  200 MG CAPS    Sig: Take 200 mg by mouth 2 (two) times daily.    Dispense:  180 capsule    Refill:  3   fludrocortisone  (FLORINEF ) 0.1 MG tablet    Sig: Take 2 tablets (0.2 mg total) by mouth daily.    Dispense:  180 tablet    Refill:  3   midodrine  (PROAMATINE ) 10 MG tablet    Sig: Take 1 tablet (10 mg total) by mouth 3 (three) times daily.    Dispense:  270 tablet    Refill:  3    Signed, Riddik Senna reddy Vashon Arch, MD, MPH, [facility]. 03/27/2024 6:48 PM    [facility]

## 2024-03-27 NOTE — Assessment & Plan Note (Signed)
 CHF, with newly reduced LVEF in the setting of A-fib RVR in 2020, subsequently normalized LVEF. Currently does not show any signs or symptoms of fluid overload. In fact his symptoms tend to be related to orthostatic hypotension.  Appears euvolemic, compensated. No prior ischemic workup available to review, with recovered LVEF I will hold off on any ischemic workup at this time.

## 2024-03-27 NOTE — Assessment & Plan Note (Signed)
 Initially diagnosed in 2020 with RVR in association with reduced LVEF. S/p cardioversion in 2020. Was briefly on amiodarone  for rhythm control.  Remains in sinus rhythm. CHA2DS2-VASc score at least 3 [hypertension, diabetes, heart failure] Remains on coagulation with Eliquis  5 mg twice daily.  No significant A-fib/flutter block done on recent Zio patch March 2025.

## 2024-03-27 NOTE — Assessment & Plan Note (Signed)
 Significant orthostatic symptoms attributed to orthostatic hypotension in the setting of presumed dysautonomia.  Surprisingly symptoms have been significantly progressive since August 2024. He does have risk factors for dysautonomia in the setting of markedly uncontrolled diabetes, schizophrenia.  Currently on multiple medications to avoid orthostatic hypotension. Has been on midodrine , droxidopa  and Florinef . Has been tolerating these well. Continue the same for now.  Recommended neurologist visit at previous follow-up visit at our office this is currently pending.  Will have office see if they can expedite this appointment for them.  I anticipate him requiring referral to dysautonomia speciality clinic for further evaluation.

## 2024-03-27 NOTE — Patient Instructions (Signed)
 Medication Instructions:  Your physician recommends that you continue on your current medications as directed. Please refer to the Current Medication list given to you today.  *If you need a refill on your cardiac medications before your next appointment, please call your pharmacy*  Lab Work: NONE If you have labs (blood work) drawn today and your tests are completely normal, you will receive your results only by: MyChart Message (if you have MyChart) OR A paper copy in the mail If you have any lab test that is abnormal or we need to change your treatment, we will call you to review the results.  Testing/Procedures: NONE  Follow-Up: At [facility], you and your health needs are our priority.  As part of our continuing mission to provide you with exceptional heart care, our providers are all part of one team.  This team includes your primary Cardiologist (physician) and Advanced Practice Providers or APPs (Physician Assistants and Nurse Practitioners) who all work together to provide you with the care you need, when you need it.  Your next appointment:   6 month(s)  Provider:   Norman Herrlich, MD    We recommend signing up for the patient portal called "MyChart".  Sign up information is provided on this After Visit Summary.  MyChart is used to connect with patients for Virtual Visits (Telemedicine).  Patients are able to view lab/test results, encounter notes, upcoming appointments, etc.  Non-urgent messages can be sent to your provider as well.   To learn more about what you can do with MyChart, go to [email or web address].   Other Instructions

## 2024-06-13 ENCOUNTER — Other Ambulatory Visit: Payer: Self-pay | Admitting: Cardiology

## 2024-06-13 DIAGNOSIS — I484 Atypical atrial flutter: Secondary | ICD-10-CM

## 2024-06-13 DIAGNOSIS — I4891 Unspecified atrial fibrillation: Secondary | ICD-10-CM

## 2024-06-13 NOTE — Telephone Encounter (Signed)
 Eliquis  5mg  refill request received. Patient is 58 years old, weight-80.1kg, Crea-1.87 on 12/05/23, Diagnosis-Afib/flutter, and last seen by Dr. Liborio on 03/27/24. Dose is appropriate based on dosing criteria. Will send in refill to requested pharmacy.

## 2024-06-18 DIAGNOSIS — R55 Syncope and collapse: Secondary | ICD-10-CM

## 2024-06-18 DIAGNOSIS — G901 Familial dysautonomia [Riley-Day]: Secondary | ICD-10-CM | POA: Diagnosis not present

## 2024-06-19 ENCOUNTER — Ambulatory Visit (HOSPITAL_BASED_OUTPATIENT_CLINIC_OR_DEPARTMENT_OTHER): Payer: Self-pay | Admitting: Family

## 2024-09-24 NOTE — Progress Notes (Unsigned)
 Cardiology Office Note:    Date:  09/24/2024   ID:  Michael Mcknight, DOB 1966-02-24, MRN [id number]  PCP:  Keren Vicenta BRAVO, MD  Cardiologist:  Redell Leiter, MD    Referring MD: Keren Vicenta BRAVO, MD    ASSESSMENT:    1. Orthostatic hypotension dysautonomic syndrome   2. Paroxysmal atrial fibrillation (HCC)   3. LV dysfunction   4. CKD (chronic kidney disease) stage 4, GFR 15-29 ml/min (HCC)    PLAN:    In order of problems listed above:  ***   Next appointment: ***   Medication Adjustments/Labs and Tests Ordered: Current medicines are reviewed at length with the patient today.  Concerns regarding medicines are outlined above.  No orders of the defined types were placed in this encounter.  No orders of the defined types were placed in this encounter.    History of Present Illness:    Michael Mcknight is a 58 y.o. male with a hx of paroxysmal atrial fibrillation maintaining sinus rhythm sick sinus syndrome heart failure initially severely reduced ejection fraction subsequently normalizing hypertension with stage IV CKD right bundle branch block and hypotension requiring pressor support with Florinef  midodrine  and droxidopa  last seen 12/28/2023. Compliance with diet, lifestyle and medications: *** Past Medical History:  Diagnosis Date   Acute kidney injury superimposed on CKD 09/20/2021   AF (paroxysmal atrial fibrillation) (HCC) 09/20/2021   AKI (acute kidney injury) 09/20/2021   Atrial fibrillation/flutter (HCC) 09/22/2019   Atypical atrial flutter (HCC)    Benign essential HTN 09/22/2019   Cardiomegaly 09/22/2019   Cardiorenal syndrome with renal failure 06/26/2020   Chronic systolic congestive heart failure (HCC) 06/26/2020   CKD (chronic kidney disease) stage 4, GFR 15-29 ml/min (HCC) 06/26/2020   CKD (chronic kidney disease), stage III (HCC) 09/22/2019   Congestive heart failure due to cardiomyopathy (HCC) [place]  [employer] class II 10/23/2019    Cyst of left kidney 10/04/2020   Dilated cardiomyopathy (HCC) ejection fraction 35% in November 2020 10/23/2019   Lobar pneumonia 09/22/2019   Near syncope 09/20/2021   Obesity (BMI 30-39.9) 06/26/2020   Orthostatic hypotension 09/20/2021   Orthostatic hypotension dysautonomic syndrome 11/30/2023   Pleural effusion 09/22/2019   Postural dizziness with presyncope 11/29/2023   Prolonged QT interval 09/20/2021   Schizophrenia (HCC) 09/22/2019    Current Medications: No outpatient medications have been marked as taking for the 09/25/24 encounter (Appointment) with Leiter Redell PARAS, MD.      EKGs/Labs/Other Studies Reviewed:    The following studies were reviewed today:  Cardiac Studies & Procedures   ______________________________________________________________________________________________     ECHOCARDIOGRAM  ECHOCARDIOGRAM COMPLETE 11/30/2023  Narrative ECHOCARDIOGRAM REPORT    [name] Date of Exam: 11/30/2023 Medical Rec #:  [id number]        Height:       66.0 in Accession #:    [id number]       Weight:       185.6 lb Date of Birth:  1966-08-12        BSA:          1.937 m Patient Age:    57 years         BP:           111/84 mmHg Patient Gender: M                HR:           90 bpm. Exam Location:  Inpatient  Procedure: 2D Echo, Cardiac Doppler and Color Doppler  Indications:    Dyspna, A-fib  History:        Patient has prior history of Echocardiogram examinations, most recent 08/27/2022. Cardiomegaly and Cardiomyopathy; Arrythmias:Atrial Fibrillation and Atrial Flutter.  Sonographer:    Sabrina Gentry Referring Phys: [id number] PROSPER M AMPONSAH  IMPRESSIONS   1. Left ventricular ejection fraction, by estimation, is 55 to 60%. The left ventricle has normal function. The left ventricle has no regional wall motion abnormalities. Left ventricular diastolic parameters are indeterminate. 2. Right ventricular systolic function is normal.  The right ventricular size is normal. Tricuspid regurgitation signal is inadequate for assessing PA pressure. 3. A small pericardial effusion is present. The pericardial effusion is posterior to the left ventricle. 4. The mitral valve is grossly normal. Trivial mitral valve regurgitation. No evidence of mitral stenosis. 5. The aortic valve was not well visualized. Aortic valve regurgitation is not visualized. No aortic stenosis is present. 6. The inferior vena cava is normal in size with greater than 50% respiratory variability, suggesting right atrial pressure of 3 mmHg.  Comparison(s): No significant change from prior study. Prior images reviewed side by side.  FINDINGS Left Ventricle: Left ventricular ejection fraction, by estimation, is 55 to 60%. The left ventricle has normal function. The left ventricle has no regional wall motion abnormalities. The left ventricular internal cavity size was normal in size. There is no left ventricular hypertrophy. Left ventricular diastolic parameters are indeterminate.  Right Ventricle: The right ventricular size is normal. No increase in right ventricular wall thickness. Right ventricular systolic function is normal. Tricuspid regurgitation signal is inadequate for assessing PA pressure.  Left Atrium: Left atrial size was normal in size.  Right Atrium: Right atrial size was normal in size.  Pericardium: A small pericardial effusion is present. The pericardial effusion is posterior to the left ventricle. Presence of epicardial fat layer.  Mitral Valve: The mitral valve is grossly normal. Trivial mitral valve regurgitation. No evidence of mitral valve stenosis. MV peak gradient, 1.1 mmHg. The mean mitral valve gradient is 1.0 mmHg.  Tricuspid Valve: The tricuspid valve is normal in structure. Tricuspid valve regurgitation is not demonstrated. No evidence of tricuspid stenosis.  Aortic Valve: The aortic valve was not well visualized. Aortic valve  regurgitation is not visualized. No aortic stenosis is present. Aortic valve mean gradient measures 2.3 mmHg. Aortic valve peak gradient measures 3.7 mmHg. Aortic valve area, by VTI measures 3.28 cm.  Pulmonic Valve: The pulmonic valve was normal in structure. Pulmonic valve regurgitation is not visualized. No evidence of pulmonic stenosis.  Aorta: The aortic root and ascending aorta are structurally normal, with no evidence of dilitation.  Venous: The inferior vena cava is normal in size with greater than 50% respiratory variability, suggesting right atrial pressure of 3 mmHg.  IAS/Shunts: The atrial septum is grossly normal.   LEFT VENTRICLE PLAX 2D LVIDd:         5.40 cm      Diastology LVIDs:         3.20 cm      LV e' medial:    5.33 cm/s LV PW:         0.80 cm      LV E/e' medial:  10.8 LV IVS:        0.70 cm      LV e' lateral:   5.98 cm/s LVOT diam:     2.00 cm      LV E/e' lateral: 9.6 LV  SV:         50 LV SV Index:   26 LVOT Area:     3.14 cm  LV Volumes (MOD) LV vol d, MOD A2C: 108.0 ml LV vol d, MOD A4C: 145.0 ml LV vol s, MOD A2C: 52.3 ml LV vol s, MOD A4C: 59.6 ml LV SV MOD A2C:     55.7 ml LV SV MOD A4C:     145.0 ml LV SV MOD BP:      71.0 ml  RIGHT VENTRICLE             IVC RV S prime:     15.80 cm/s  IVC diam: 1.00 cm TAPSE (M-mode): 2.0 cm  LEFT ATRIUM           Index LA diam:      3.00 cm 1.55 cm/m LA Vol (A2C): 32.3 ml 16.67 ml/m LA Vol (A4C): 42.3 ml 21.83 ml/m AORTIC VALVE                    PULMONIC VALVE AV Area (Vmax):    3.30 cm     PV Vmax:       0.78 m/s AV Area (Vmean):   2.95 cm     PV Peak grad:  2.4 mmHg AV Area (VTI):     3.28 cm AV Vmax:           96.07 cm/s AV Vmean:          68.500 cm/s AV VTI:            0.153 m AV Peak Grad:      3.7 mmHg AV Mean Grad:      2.3 mmHg LVOT Vmax:         101.00 cm/s LVOT Vmean:        64.400 cm/s LVOT VTI:          0.160 m LVOT/AV VTI ratio: 1.04  AORTA Ao Root diam: 3.30 cm Ao Asc  diam:  3.20 cm  MITRAL VALVE MV Area (PHT): 3.03 cm    SHUNTS MV Area VTI:   4.65 cm    Systemic VTI:  0.16 m MV Peak grad:  1.1 mmHg    Systemic Diam: 2.00 cm MV Mean grad:  1.0 mmHg MV Vmax:       0.52 m/s MV Vmean:      34.6 cm/s MV Decel Time: 250 msec MV E velocity: 57.30 cm/s MV A velocity: 70.80 cm/s MV E/A ratio:  0.81  Stanly Leavens MD Electronically signed by Stanly Leavens MD Signature Date/Time: 11/30/2023/11:04:24 AM    Final   TEE  ECHO TEE 09/26/2019  Narrative TRANSESOPHOGEAL ECHO REPORT    [name] Date of Exam: 09/26/2019 Medical Rec #:  [id number]        Height:       67.0 in Accession #:    [id number]       Weight:       231.0 lb Date of Birth:  [birth year]        BSA:          2.15 m Patient Age:    53 years         BP:           122/86 mmHg Patient Gender: M                HR:           120  bpm. Exam Location:  Inpatient   Procedure: Transesophageal Echo, Cardiac Doppler and Color Doppler  Indications:     I48.92* Unspecified atrial flutter  History:         Patient has no prior history of Echocardiogram examinations. Cardiomegaly; Arrythmias:Atrial Fibrillation and Atrial Flutter.  Sonographer:     Ellouise Mose RDCS Referring Phys:  [id number] RAPHAEL SAILOR DUNN Diagnosing Phys: Aleene Passe MD    PROCEDURE: The TEE was followed by a successful Cardioversion. Patients was monitored while under deep sedation Anesthestetic sedation was provided intravenously by Anesthesiology: 250mg  of Propofol . The transesophogeal probe was passed through the esophogus of the patient. Imaged were obtained with the patient in a left lateral decubitus position. The patient developed no complications during the procedure.  IMPRESSIONS   1. Left ventricular ejection fraction, by visual estimation, is 30 to 35%. The left ventricle has moderate to severely decreased function. There is no left ventricular hypertrophy. 2. Global right  ventricle has mildly reduced systolic function.The right ventricular size is normal. No increase in right ventricular wall thickness. 3. Left atrial size was normal. 4. No thrombi in the LA and LAA. 5. Right atrial size was normal. 6. The mitral valve is normal in structure. Mild to moderate mitral valve regurgitation. 7. The tricuspid valve is normal in structure. Tricuspid valve regurgitation moderate. 8. The aortic valve is normal in structure. Aortic valve regurgitation is trivial. 9. The pulmonic valve was normal in structure. Pulmonic valve regurgitation is trivial. 10. Moderately elevated pulmonary artery systolic pressure.  FINDINGS Left Ventricle: Left ventricular ejection fraction, by visual estimation, is 30 to 35%. The left ventricle has moderate to severely decreased function. There is no left ventricular hypertrophy.  Right Ventricle: The right ventricular size is normal. No increase in right ventricular wall thickness. Global RV systolic function is has mildly reduced systolic function. The tricuspid regurgitant velocity is 3.01 m/s, and with an assumed right atrial pressure of 15 mmHg, the estimated right ventricular systolic pressure is moderately elevated at 51.2 mmHg.  Left Atrium: Left atrial size was normal in size. No thrombi in the LA and LAA.  Right Atrium: Right atrial size was normal in size  Pericardium: There is no evidence of pericardial effusion.  Mitral Valve: The mitral valve is normal in structure. There is mild thickening of the mitral valve leaflet(s). Mild to moderate mitral valve regurgitation.  Tricuspid Valve: The tricuspid valve is normal in structure. Tricuspid valve regurgitation moderate.  Aortic Valve: The aortic valve is normal in structure. Aortic valve regurgitation is trivial.  Pulmonic Valve: The pulmonic valve was normal in structure. Pulmonic valve regurgitation is trivial.  Aorta: The aortic root and ascending aorta are structurally  normal, with no evidence of dilitation.  Shunts: No atrial level shunt detected by color flow Doppler.   TRICUSPID VALVE             Normals TR Peak grad:   36.2 mmHg TR Vmax:        301.00 cm/s 288 cm/s   Aleene Passe MD Electronically signed by Aleene Passe MD Signature Date/Time: 09/26/2019/5:08:23 PM    Final  MONITORS  LONG TERM MONITOR (3-14 DAYS) 02/10/2024  Narrative Patch Wear Time:  12 days and 8 hours (2025-03-07T12:07:49-0500 to 2025-03-19T22:07:43-0400)  Patient had a min HR of 48 bpm, max HR of 176 bpm, and avg HR of 66 bpm. Predominant underlying rhythm was Sinus Rhythm. Bundle Branch Block/IVCD was present.  There were no symptomatic events.  There were no  episodes of sinus pause greater than 3 seconds second or third-degree AV block.  Supraventricular ectopy was frequent with brief runs of APCs and atrial tachycardia with the fastest interval lasting 4 beats with a max rate of 176 bpm, the longest lasting 13.1 secs with an avg rate of 134 bpm. Isolated SVEs were frequent (10.8%, 101075), SVE Couplets were occasional (1.1%, 5300), and SVE Triplets were rare (<1.0%, 491).  There were no episodes of atrial fibrillation or flutter  Isolated VEs were rare (<1.0%), and no VE Couplets or VE Triplets were present.       ______________________________________________________________________________________________          Recent Labs: 11/02/2023: ALT 106 11/29/2023: B Natriuretic Peptide 64.5; TSH 1.372 12/02/2023: Magnesium 2.0 12/05/2023: BUN 26; Creatinine, Ser 1.87; Hemoglobin 9.1; Platelets 639; Potassium 3.9; Sodium 137  Recent Lipid Panel    Component Value Date/Time   CHOL 138 09/23/2019 0247   TRIG 84 09/23/2019 0247   HDL 35 (L) 09/23/2019 0247   CHOLHDL 3.9 09/23/2019 0247   VLDL 17 09/23/2019 0247   LDLCALC 86 09/23/2019 0247    Physical Exam:    VS:  There were no vitals taken for this visit.    Wt Readings from Last 3 Encounters:   03/27/24 176 lb 9.6 oz (80.1 kg)  01/21/24 181 lb (82.1 kg)  12/28/23 178 lb 9.6 oz (81 kg)     GEN: *** Well nourished, well developed in no acute distress HEENT: Normal NECK: No JVD; No carotid bruits LYMPHATICS: No lymphadenopathy CARDIAC: ***RRR, no murmurs, rubs, gallops RESPIRATORY:  Clear to auscultation without rales, wheezing or rhonchi  ABDOMEN: Soft, non-tender, non-distended MUSCULOSKELETAL:  No edema; No deformity  SKIN: Warm and dry NEUROLOGIC:  Alert and oriented x 3 PSYCHIATRIC:  Normal affect    Signed, Redell Leiter, MD  09/24/2024 10:42 AM    [facility]

## 2024-09-25 ENCOUNTER — Ambulatory Visit: Attending: Cardiology | Admitting: Cardiology

## 2024-09-25 ENCOUNTER — Encounter: Payer: Self-pay | Admitting: Cardiology

## 2024-09-25 VITALS — BP 148/92 | HR 54 | Ht 66.0 in | Wt 184.6 lb

## 2024-09-25 DIAGNOSIS — I951 Orthostatic hypotension: Secondary | ICD-10-CM

## 2024-09-25 DIAGNOSIS — I519 Heart disease, unspecified: Secondary | ICD-10-CM

## 2024-09-25 DIAGNOSIS — N184 Chronic kidney disease, stage 4 (severe): Secondary | ICD-10-CM

## 2024-09-25 DIAGNOSIS — I480 Paroxysmal atrial fibrillation: Secondary | ICD-10-CM | POA: Diagnosis not present

## 2024-09-25 NOTE — Patient Instructions (Signed)

## 2024-12-12 ENCOUNTER — Other Ambulatory Visit: Payer: Self-pay

## 2024-12-12 DIAGNOSIS — I4891 Unspecified atrial fibrillation: Secondary | ICD-10-CM

## 2024-12-12 DIAGNOSIS — I484 Atypical atrial flutter: Secondary | ICD-10-CM

## 2024-12-12 MED ORDER — APIXABAN 5 MG PO TABS: 5.0000 mg | ORAL_TABLET | Freq: Two times a day (BID) | ORAL | 1 refills | Status: AC

## 2024-12-12 NOTE — Telephone Encounter (Signed)
 Refill request received for Eliquis  5mg . Thank you!

## 2024-12-15 NOTE — Telephone Encounter (Signed)
 Rx sent to pharmacy on 12/12/24.
